# Patient Record
Sex: Male | Born: 1940 | Race: White | Hispanic: Refuse to answer | Marital: Married | Smoking: Former smoker
Health system: Southern US, Community
[De-identification: ages and names within clinical notes are randomized; demographics above are authoritative.]

## PROBLEM LIST (undated history)

## (undated) DIAGNOSIS — R9439 Abnormal result of other cardiovascular function study: Secondary | ICD-10-CM

## (undated) DIAGNOSIS — R931 Abnormal findings on diagnostic imaging of heart and coronary circulation: Secondary | ICD-10-CM

## (undated) DIAGNOSIS — D696 Thrombocytopenia, unspecified: Secondary | ICD-10-CM

## (undated) DIAGNOSIS — E785 Hyperlipidemia, unspecified: Secondary | ICD-10-CM

## (undated) DIAGNOSIS — E669 Obesity, unspecified: Secondary | ICD-10-CM

## (undated) DIAGNOSIS — Z872 Personal history of diseases of the skin and subcutaneous tissue: Secondary | ICD-10-CM

## (undated) DIAGNOSIS — I509 Heart failure, unspecified: Secondary | ICD-10-CM

## (undated) DIAGNOSIS — I252 Old myocardial infarction: Secondary | ICD-10-CM

## (undated) DIAGNOSIS — T7840XA Allergy, unspecified, initial encounter: Secondary | ICD-10-CM

## (undated) DIAGNOSIS — K219 Gastro-esophageal reflux disease without esophagitis: Secondary | ICD-10-CM

## (undated) DIAGNOSIS — Z95810 Presence of automatic (implantable) cardiac defibrillator: Secondary | ICD-10-CM

## (undated) DIAGNOSIS — J309 Allergic rhinitis, unspecified: Secondary | ICD-10-CM

## (undated) DIAGNOSIS — I5042 Chronic combined systolic (congestive) and diastolic (congestive) heart failure: Secondary | ICD-10-CM

## (undated) DIAGNOSIS — E1159 Type 2 diabetes mellitus with other circulatory complications: Secondary | ICD-10-CM

## (undated) DIAGNOSIS — I2510 Atherosclerotic heart disease of native coronary artery without angina pectoris: Secondary | ICD-10-CM

## (undated) DIAGNOSIS — M76899 Other specified enthesopathies of unspecified lower limb, excluding foot: Secondary | ICD-10-CM

## (undated) DIAGNOSIS — N189 Chronic kidney disease, unspecified: Secondary | ICD-10-CM

## (undated) DIAGNOSIS — I340 Nonrheumatic mitral (valve) insufficiency: Secondary | ICD-10-CM

## (undated) DIAGNOSIS — E119 Type 2 diabetes mellitus without complications: Secondary | ICD-10-CM

## (undated) DIAGNOSIS — Z950 Presence of cardiac pacemaker: Secondary | ICD-10-CM

## (undated) DIAGNOSIS — I10 Essential (primary) hypertension: Secondary | ICD-10-CM

## (undated) HISTORY — DX: Other specified enthesopathies of unspecified lower limb, excluding foot: M76.899

## (undated) HISTORY — DX: Atherosclerotic heart disease of native coronary artery without angina pectoris: I25.10

## (undated) HISTORY — DX: Abnormal findings on diagnostic imaging of heart and coronary circulation: R93.1

## (undated) HISTORY — DX: Obesity, unspecified: E66.9

## (undated) HISTORY — DX: Presence of cardiac pacemaker: Z95.0

## (undated) HISTORY — DX: Type 2 diabetes mellitus without complications: E11.9

## (undated) HISTORY — DX: Thrombocytopenia, unspecified: D69.6

## (undated) HISTORY — DX: Hyperlipidemia, unspecified: E78.5

## (undated) HISTORY — DX: Old myocardial infarction: I25.2

## (undated) HISTORY — DX: Chronic combined systolic (congestive) and diastolic (congestive) heart failure: I50.42

## (undated) HISTORY — DX: Allergic rhinitis, unspecified: J30.9

## (undated) HISTORY — DX: Chronic kidney disease, unspecified: N18.9

## (undated) HISTORY — DX: Personal history of diseases of the skin and subcutaneous tissue: Z87.2

## (undated) HISTORY — DX: Allergy, unspecified, initial encounter: T78.40XA

## (undated) HISTORY — DX: Abnormal result of other cardiovascular function study: R94.39

## (undated) HISTORY — DX: Heart failure, unspecified: I50.9

## (undated) HISTORY — DX: Presence of automatic (implantable) cardiac defibrillator: Z95.810

## (undated) HISTORY — DX: Nonrheumatic mitral (valve) insufficiency: I34.0

## (undated) HISTORY — DX: Type 2 diabetes mellitus with other circulatory complications: E11.59

## (undated) HISTORY — DX: Gastro-esophageal reflux disease without esophagitis: K21.9

## (undated) HISTORY — DX: Essential (primary) hypertension: I10

---
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
## 2002-08-11 ENCOUNTER — Encounter: Payer: Self-pay | Admitting: Interventional Cardiology

## 2002-08-11 ENCOUNTER — Inpatient Hospital Stay (HOSPITAL_COMMUNITY): Admission: EM | Admit: 2002-08-11 | Discharge: 2002-08-14 | Payer: Self-pay | Admitting: *Deleted

## 2002-08-12 ENCOUNTER — Encounter: Payer: Self-pay | Admitting: Interventional Cardiology

## 2002-09-04 ENCOUNTER — Encounter (HOSPITAL_COMMUNITY): Admission: RE | Admit: 2002-09-04 | Discharge: 2002-12-03 | Payer: Self-pay | Admitting: Interventional Cardiology

## 2002-12-04 ENCOUNTER — Encounter (HOSPITAL_COMMUNITY): Admission: RE | Admit: 2002-12-04 | Discharge: 2002-12-11 | Payer: Self-pay | Admitting: Interventional Cardiology

## 2002-12-12 ENCOUNTER — Encounter (HOSPITAL_COMMUNITY): Admission: RE | Admit: 2002-12-12 | Discharge: 2003-03-12 | Payer: Self-pay | Admitting: Interventional Cardiology

## 2003-04-07 ENCOUNTER — Encounter (HOSPITAL_COMMUNITY): Admission: RE | Admit: 2003-04-07 | Discharge: 2003-07-06 | Payer: Self-pay | Admitting: Interventional Cardiology

## 2003-07-08 ENCOUNTER — Encounter (HOSPITAL_COMMUNITY): Admission: RE | Admit: 2003-07-08 | Discharge: 2003-10-06 | Payer: Self-pay | Admitting: Interventional Cardiology

## 2003-10-08 ENCOUNTER — Encounter (HOSPITAL_COMMUNITY): Admission: RE | Admit: 2003-10-08 | Discharge: 2004-01-06 | Payer: Self-pay | Admitting: Interventional Cardiology

## 2003-11-22 ENCOUNTER — Encounter (INDEPENDENT_AMBULATORY_CARE_PROVIDER_SITE_OTHER): Payer: Self-pay | Admitting: *Deleted

## 2003-11-22 ENCOUNTER — Ambulatory Visit (HOSPITAL_COMMUNITY): Admission: RE | Admit: 2003-11-22 | Discharge: 2003-11-22 | Payer: Self-pay | Admitting: Gastroenterology

## 2004-01-08 ENCOUNTER — Encounter (HOSPITAL_COMMUNITY): Admission: RE | Admit: 2004-01-08 | Discharge: 2004-04-07 | Payer: Self-pay | Admitting: Interventional Cardiology

## 2004-04-08 ENCOUNTER — Encounter (HOSPITAL_COMMUNITY): Admission: RE | Admit: 2004-04-08 | Discharge: 2004-07-07 | Payer: Self-pay | Admitting: Interventional Cardiology

## 2004-07-08 ENCOUNTER — Encounter (HOSPITAL_COMMUNITY): Admission: RE | Admit: 2004-07-08 | Discharge: 2004-10-06 | Payer: Self-pay | Admitting: Interventional Cardiology

## 2004-10-07 ENCOUNTER — Encounter (HOSPITAL_COMMUNITY): Admission: RE | Admit: 2004-10-07 | Discharge: 2005-01-05 | Payer: Self-pay | Admitting: Cardiology

## 2005-01-07 ENCOUNTER — Encounter (HOSPITAL_COMMUNITY): Admission: RE | Admit: 2005-01-07 | Discharge: 2005-04-07 | Payer: Self-pay | Admitting: Cardiology

## 2005-04-09 ENCOUNTER — Encounter (HOSPITAL_COMMUNITY): Admission: RE | Admit: 2005-04-09 | Discharge: 2005-07-08 | Payer: Self-pay | Admitting: Cardiology

## 2005-08-07 ENCOUNTER — Encounter (HOSPITAL_COMMUNITY): Admission: RE | Admit: 2005-08-07 | Discharge: 2005-11-05 | Payer: Self-pay | Admitting: Cardiology

## 2005-11-06 ENCOUNTER — Encounter (HOSPITAL_COMMUNITY): Admission: RE | Admit: 2005-11-06 | Discharge: 2006-02-03 | Payer: Self-pay | Admitting: Cardiology

## 2006-02-05 ENCOUNTER — Encounter (HOSPITAL_COMMUNITY): Admission: RE | Admit: 2006-02-05 | Discharge: 2006-05-06 | Payer: Self-pay | Admitting: Cardiology

## 2006-05-07 ENCOUNTER — Encounter (HOSPITAL_COMMUNITY): Admission: RE | Admit: 2006-05-07 | Discharge: 2006-08-05 | Payer: Self-pay | Admitting: Cardiology

## 2006-08-07 ENCOUNTER — Encounter (HOSPITAL_COMMUNITY): Admission: RE | Admit: 2006-08-07 | Discharge: 2006-11-05 | Payer: Self-pay | Admitting: Cardiology

## 2006-11-06 ENCOUNTER — Encounter (HOSPITAL_COMMUNITY): Admission: RE | Admit: 2006-11-06 | Discharge: 2007-02-04 | Payer: Self-pay | Admitting: Cardiology

## 2007-02-05 ENCOUNTER — Encounter (HOSPITAL_COMMUNITY): Admission: RE | Admit: 2007-02-05 | Discharge: 2007-05-06 | Payer: Self-pay | Admitting: Cardiology

## 2007-05-09 ENCOUNTER — Encounter (HOSPITAL_COMMUNITY): Admission: RE | Admit: 2007-05-09 | Discharge: 2007-08-07 | Payer: Self-pay | Admitting: Cardiology

## 2007-08-08 ENCOUNTER — Encounter (HOSPITAL_COMMUNITY): Admission: RE | Admit: 2007-08-08 | Discharge: 2007-11-06 | Payer: Self-pay | Admitting: Cardiology

## 2007-11-07 ENCOUNTER — Encounter (HOSPITAL_COMMUNITY): Admission: RE | Admit: 2007-11-07 | Discharge: 2007-12-06 | Payer: Self-pay | Admitting: Cardiology

## 2007-12-08 ENCOUNTER — Encounter (HOSPITAL_COMMUNITY): Admission: RE | Admit: 2007-12-08 | Discharge: 2008-03-01 | Payer: Self-pay | Admitting: Cardiology

## 2008-03-07 ENCOUNTER — Encounter (HOSPITAL_COMMUNITY): Admission: RE | Admit: 2008-03-07 | Discharge: 2008-06-05 | Payer: Self-pay | Admitting: Cardiology

## 2008-06-06 ENCOUNTER — Encounter (HOSPITAL_COMMUNITY): Admission: RE | Admit: 2008-06-06 | Discharge: 2008-09-04 | Payer: Self-pay | Admitting: Cardiology

## 2008-09-06 ENCOUNTER — Encounter (HOSPITAL_COMMUNITY): Admission: RE | Admit: 2008-09-06 | Discharge: 2008-11-15 | Payer: Self-pay | Admitting: Cardiology

## 2011-04-24 NOTE — Op Note (Signed)
NAME:  [name], [name] NO.:  [id number]   MEDICAL RECORD NO.:  [id number]                   PATIENT TYPE:  AMB   LOCATION:  ENDO                                 FACILITY:  [facility]   PHYSICIAN:  Graylin Shiver, M.D.                DATE OF BIRTH:  30-Jul-1941   DATE OF PROCEDURE:  11/22/2003  DATE OF DISCHARGE:                                 OPERATIVE REPORT   PROCEDURE:  Colonoscopy.   INDICATION FOR PROCEDURE:  Screening.   Informed consent was obtained after explanation of the risks of bleeding,  infection, and perforation.   PREMEDICATIONS:  1. Fentanyl 50 mcg IV.  2. Versed 6 mg IV.   DESCRIPTION OF PROCEDURE:  With the patient in the left lateral decubitus  position, a rectal exam was performed, and no masses were felt.  The Olympus  colonoscope was inserted into the rectum and advanced around the colon to  the cecum.  Cecal landmarks were identified.  In the cecum, there was a  small 3 mm sessile polyp, removed with cold forceps.  The ascending colon  looked normal.  The transverse colon looked normal.  The descending colon  and sigmoid revealed diverticulosis.  The rectum was normal.  He tolerated  the procedure well without complications.   IMPRESSION:  1. Small cecal polyp.  Diagnosis code 211.3.  2. Diverticulosis of the left colon.  Diagnosis code 562.10.                                               Graylin Shiver, M.D.    Germain Osgood  D:  11/22/2003  T:  11/22/2003  Job:  [id number]   cc:   Donia Guiles, M.D.  [address] [place]  [place] [address]  Fax: [phone]

## 2011-04-24 NOTE — Cardiovascular Report (Signed)
NAME:  ELISE, GLADDEN NO.:  [id number]   MEDICAL RECORD NO.:  [id number]                   PATIENT TYPE:  INP   LOCATION:  1826                                 FACILITY:  [facility]   PHYSICIAN:  Lesleigh Noe, M.D.            DATE OF BIRTH:  [birth year]   DATE OF PROCEDURE:  08/11/2002  DATE OF DISCHARGE:                              CARDIAC CATHETERIZATION   INDICATIONS FOR PROCEDURE:  Acute inferior myocardial infarction with third-  degree heart block and cardiogenic shock. The patient has a prior history of  large anterior myocardial infarction in 1993 and angioplasty 1993.   PROCEDURE PERFORMED:  1. Left heart catheterization.  2. Selective coronary angiogram.  3. Left ventriculography.  4. Temporary pacemaker insertion.  5. AngioJet thrombectomy.  6. Multiple overlapping right coronary artery stents in the mid and proximal     segment (x3).   DESCRIPTION OF PROCEDURE:  After informed consent, the patient was brought  quickly under emergency circumstances from the emergency room where he was  in third-degree heart block and cardiogenic shock requiring IV fluids, IV  dopamine and analgesia for pain.  He has a prior history of anterior  myocardial infarction suffered while scuba diving in the [place] in 1993,  and subsequently had LAD angioplasty upon return to [place]. He has done  relatively well since that time until today when preparing to play golf, he  suddenly became ill. In the ER, he was noted to be in extremist with low  blood pressure pallor and profuse sweating.   In the catheterization lab the left and right ileofemoral regions were  sterilely prepped and draped.  A 7 French sheath starting in the right  femoral artery and a 6 French sheath in the right femoral vein, both using  the modified Seldinger technique.  A balloon tip pacing wire was placed in  the RV apex and an adequate threshold was achieved and backup pacing  was  started at 80 beats per minute in DVI mode and an mA of 3.  The threshold  was less than 1 mA.   We then performed coronary angiography using a 6 French A2 multipurpose  catheter. This demonstrated total occlusion of the RCA in the mid segment  and patent LAD and circumflex with region of segmental 50-60% LAD stenosis.  Left ventriculography was performed by hand injection demonstrating  anteroapical akinesis and inferior akinesis.  EF was estimated to be 25-30%.   Percutaneous coronary intervention was then performed using a 7 [place] side-  hole JR4 guide catheter, a BMW 190 cm long wire. This established  reperfusion. He had been given 5000 units of heparin in the emergency room  and a double bolus followed by an infusion of Integrilin was started in the  catheterization lab prior to wire manipulation. ACT was documented to be 254  seconds initially and then subsequent ACT of 328  seconds.   Because of what appeared to be a significant thrombosis burden, AngioJet  thrombectomy was performed.  Two runs were performed after extending the BMW  wire. We then performed intervention of the right coronary placing a distal  mid segment 3.0, 24 mm long stent and a overlapping more proximal 3.5 mm 20  mm long stent in the right coronary with reestablishment of wide patency. I  was unhappy with the appearance of the right coronary just distal to the  stented segment. It appeared somewhat hazy and disrupted in the LAO views.  After a period of consideration, I decided to place a very short stent  distally overlapping with the distal most portion of the stented segment. We  had difficulty getting this 8 mm long, 3.0 mm stent to tract. We had to  place a luge wire to provide buddy support. The stent then advanced easily  and was deployed without difficulty. The 3.0 mm stents were all brought to  around 2.3 mm with thin diameter in size using up to 17 atmospheres of  pressure. The more proximal  3.5 stent was brought to nominal diameter. The  patient tolerated the procedure. After reestablishment of flow, heart block  resolved. At the completion of the case, the pacemaker was removed. Dopamine  was discontinued because the patient had an adequate blood pressure.  A  Foley catheter was placed because of urinary retention, then the case was  terminated.   RESULTS:  I:  HEMODYNAMIC DATA:  a.  Aortic pressure 124/78.  b.  Left ventricular pressure 121/27.   II:  LEFT VENTRICULOGRAPHY:  The left ventricle is dilated. Anteroapical  akinesis is noted. Inferior akinesis is noted. EF is estimated to be 20-30%.  No MR.   III:  CORONARY ANGIOGRAPHY:  a.  Left main coronary artery:  Widely patent.  b.  Left anterior descending coronary:  The proximal/mid segment containing  50-60% regions of narrowing but no high-grade obstruction.  The first  diagonal contains a 70% ostial narrowing. The LAD wraps around the left  ventricular apex.  c.  Circumflex artery:  Two branches arise from the circumflex and no  significant obstruction is noted.  d.  RCA:  The RCA is 100% occluded in the mid vessel.   IV:  RIGHT CORONARY PERCUTANEOUS INTERVENTION:  RCA:  100% prior to  procedure in the mid segment reduced to 0% with brisk TIMI grade 3 flow  following 3.5 mm, 20 mm, long proximal stent overlapped with a 3.0 mm, 24 mm  long mid stent overlapped with an 8 mm, 3.0 stent at the distal margin of  the stented segment. Post reperfusion chest pain, resolved.   CONCLUSIONS:  1. Acute inferior myocardial infarction with shock and third-degree heart     block.  2. Successful percutaneous intervention on the right coronary with     reestablishment of flow to a totally occluded vessel to 0% stenosis. TIMI     grade 3 flow was noted after stenting as outlined above.  3. Prior anterolateral myocardial infarction in 1993.  4. Prior left anterior descending angioplasty following anterior myocardial     infarction in 1993.  5. Left ventricular dysfunction as described above.   PLAN:  Continue Ace and beta blocker. We will switch to Coreg off of  Lopressor. Continue statin therapy. Watch for evidence of heart failure.  Will more than likely need diuretic therapy to keep his lungs dry as he  recovers over the next several days.  Integrilin x24 hours, aspirin and  Plavix. Close clinical followup. Monitor for malignant arrhythmias.                                                                                          Lesleigh Noe, M.D.    HWS/MEDQ  D:  08/11/2002  T:  08/14/2002  Job:  [id number]   cc:   Desma Maxim, M.D.

## 2011-04-24 NOTE — Discharge Summary (Signed)
NAME:  [name], [name] NO.:  [id number]   MEDICAL RECORD NO.:  [id number]                   PATIENT TYPE:  INP   LOCATION:  4727                                 FACILITY:  [facility]   PHYSICIAN:  Lyn Records, M.D.                DATE OF BIRTH:  [birth year]   DATE OF ADMISSION:  08/11/2002  DATE OF DISCHARGE:  08/14/2002                                 DISCHARGE SUMMARY   PROCEDURE:  (08/11/02) Stent x 3 RCA.   DISCHARGE DIAGNOSES:  1. Coronary atherosclerotic heart disease.     a. Acute inferior myocardial infarction with shock and third degree        arteriovenous block.     b. Temporary transvenous pacemaker.     c. Successful percutaneous intervention with AngioJet and stent x 3 right        coronary artery.     d. Ischemic alveolar function, ejection fraction approximately 30% with        anterior apical akinesis/inferior akinesia.     e. Residual 30-60% proximal and mid left anterior descending; 70%        diagonal number one.  Circumflex was normal.  Left main okay.     f. Prior anterior myocardial infarction in 1993; prior left anterior        descending percutaneous transluminal coronary angioplasty in 1993.     g. Peak CK of 3200.  2. History of dyslipidemia on Zetia and Zocor.  3. Diabetes mellitus.  Admission Glucophage held secondary to exposing     kidneys to cath dye.  Restarted 08/13/02.  4. History of hypertension.  Actually profoundly hypotensive on admission in     the setting of acute inferior myocardial infarction and cardiogenic     shock.  Blood pressure stabilized and at the time of discharge his blood     pressure was 120s over 70s on Norvasc and Altace as prior to admission.   PLAN:  The patient was discharged home in stable condition.   DISCHARGE MEDICATIONS:  1. (New) Furosemide (Lasix) 20 mg p.o. q.d.  2. (New) Plavix 75 mg 1 p.o. q.d. for one month.  3. (New) Coreg 12.5 mg 1/2 tab p.o. b.i.d.  4. (New) Nitroglycerin  tablets 0.4 mg 1 sublingual p.r.n. chest pain up to 3     tabs in 15 minutes.  5. Altace 10 mg p.o. q.d.  6. Norvasc 5 mg p.o. q.d.  7. Zocor 20 mg p.o. q.h.s.  8. Zetia 10 mg p.o. q.d.  9. Enteric coated aspirin 225 mg 1 q.d.  10.      Glucophage 750 mg p.o. b.i.d.   ACTIVITY:  As outlined by cardiac rehab nurse.  Anticipate enrollment in  cardiac rehab as an outpatient.'   DIET:  Low fat, low cholesterol, no added salt/low salt.   WOUND CARE:  May shower.   SPECIAL INSTRUCTIONS:  Stop Metoprolol.  Call our office if he develops  large amounts of swelling or bruising in the groin area.   FOLLOWUP:  Dr. Verdis Prime on August 28, 2002 at 4:15 p.m.; office number  [phone].   HISTORY OF PRESENT ILLNESS:  The patient is a 71 year old diabetic male with  a history of prior anterior MI with subsequent PTCA of the LAD back in 1993.  His last cardiac evaluation was an office treadmill test in 1999 which was  okay.   The patient was on his way to play golf with his son, developed acute  anterior chest discomfort.  His son drove him to the emergency room where he  had EKG changes consistent with acute inferior myocardial infarction.  He  was diaphoretic.  His blood pressure was 80 systolic.  Heart rate 30s to  40s.  Complete heart block with junctional escape.  He was given aspirin  with subsequent emesis.  He was given Fentanyl 150 mg, Zofran without much  relief with nausea.  He was given heparin bolus 5000 units with subsequent  drip and dopamine drip at 5 mcg per kg per minute.  He was also given 1 amp  of atropine and wide open IV fluids.  His blood pressure improved to 102/70s  and heart 50s in junctional rhythm, respiratory rate 16.   The patient was taken urgently to cardiac catheterization lab where he  underwent left heart catheterization and coronary angiography as well as LV  gram.  A temporary transvenous pacemaker was placed for heart rate and  hemodynamic support.   The results are as follows:  1. LV gram:  Anterior apical akinesis/inferior akinesia, ejection fraction     approximately 30%.  2. Coronary angiography:     A. Left main.     B. LAD:  30-60% proximal/mid.  Diagonal #1 70%.     C. Circumflex:  Normal.     D. RCA:  100% mid.  3. PCI of RCA with subsequent stent x 3.  Reduction of stenosis from 100% to     0% with TIMI-III flow established.   The patient was given IV Integrelin bolus with subsequent drip.  His Toprol  was discontinued and he was initiated on a low-dose Coreg.  He was continued  on Altace and Norvasc.  The patient had a good post procedure hospital  course, ambulating up and about cardiac rehab, and without subsequent  complaints.  He was discharged home in stable condition on medications as  detailed above.   LABORATORY TESTS:  Admission WBC 10.2, hemoglobin 15.2, hematocrit 42.8,  platelets 229.  Protime 12.3, INR 0.9, PTT 23.  Sodium 140, potassium 3.9,  chloride 111, glucose 165, BUN 28, creatinine 1.4 (BUN 19 and creatinine 1.3  on 08/12/02).  LFTs within normal range.  First CK 116, MB fraction 2.2,  troponin-I 0.04.  Second CK 3786, MB fraction 286.9.  Third CK of 3766, MB  fraction 215.2.  Fourth CK of 1939, MB fraction 92.7.  EKG revealed complete  heart block and changes consistent with acute inferior myocardial infarction  with reciprocal anterior ST-T wave changes.  Chest x-ray on 08/12/02  revealed interval development of patchy opacities at the left base  reflecting atelectatic changes or air space disease.     Salomon Fick, N.P.                       Lyn Records, M.D.    MES/MEDQ  D:  08/14/2002  T:  08/15/2002  Job:  [id number]   cc:   Desma Maxim, M.D.

## 2011-04-24 NOTE — H&P (Signed)
NAME:  [name], [name] NO.:  [id number]   MEDICAL RECORD NO.:  [id number]                   PATIENT TYPE:  EMS   LOCATION:  MAJO                                 FACILITY:  [facility]   PHYSICIAN:  Lyn Records, M.D.                DATE OF BIRTH:  10/30/1941   DATE OF ADMISSION:  08/11/2002  DATE OF DISCHARGE:                                HISTORY & PHYSICAL   PRIMARY CARE PHYSICIAN:  Desma Maxim, M.D.   IMPRESSION:  1. Acute inferior myocardial infarction complicated by complete heart block     and severe hypotension.  2. Diabetes mellitus.  3. Elevated cholesterol.   PLAN:  Urgently to catheterization lab, anticipate percutaneous intervention  if indicated and able.  The risks, potential complications, benefits, and  alternatives to the procedure were discussed in detail with the patient's  son.  Anticipate placement of temporary transvenous pacemaker, as the  patient is in complete heart block with slow junctional rhythm.   HISTORY OF PRESENT ILLNESS:  The patient is a 70 year old diabetic male with  history of prior anterior MI (occurred in the [place], question aborted with  TPA) in approximately 1992.  Subsequent PTCA of the proximal LAD in 1993.  His last evaluation in our clinic involved a treadmill test in the office,  which was okay.   On the way to play golf today with his son, the patient developed the acute  onset of malaise, anterior chest discomfort.  The son drove him to the [facility] emergency room, where EKG changes were consistent with acute inferior  myocardial infarction.  The patient was progressively diaphoretic, pale, and  hypotensive with blood pressure running 80s to 90s systolic.  He was given  four baby aspirin, had emesis including aspirin pills.  He was given Zofran  IV without much relief of nausea.  Given fentanyl 15 mcg IV for chest  discomfort.  Unable to give nitrates secondary to hypotension.  Given IV  heparin bolus 5000 and drip and dopamine drip 5 mcg/kg per minute.  Also  given 1 amp of atropine.  IV fluids were wide open.  Blood pressure improved  to 102/75, heart rate to 50s, though remained in complete heart block.  Respiratory rate was 16.  Room air saturations were okay, and his chest x-  ray was negative for congestion.  He was then taken urgently to the  catheterization lab.   PAST MEDICAL HISTORY:  1. Coronary atherosclerotic heart disease.     a. 8/93:  PTCA of the proximal LAD.  His EF was approximately 35-40%.        Had severe anterior hypokinesis/akinesis.  Residual ostial 50-60%        diagonal 2.     b. 12/99:  Office treadmill test, which was okay.  2. Diabetes mellitus.  3. Mild diverticulosis.  4. Elevated cholesterol, last  cholesterol profile from 06/30/02 revealed     cholesterol 211, triglycerides 221 HDL 40, LDL 140.  Zetia was added at     that time.  5. Hypertension.   PAST SURGICAL HISTORY:  None.   ALLERGIES:  No known drug allergies.  Okay with seafood, shellfish, and  iodinated products.   MEDICATIONS:  1. Aspirin one per day.  2. Zocor 20 mg p.o. q.d.  3. Metoprolol 50 mg p.o. q.d.  4. Altace 10 mg p.o. q.d.  5. Norvasc 5 mg p.o. q.d.  6. Glucophage XR 750 mg p.o. b.i.d.  7. Zetia 10 mg p.o. q.d.   SOCIAL HISTORY:  Tobacco/ETOH negative.  The patient works as an Interior and spatial designer.  He is married for greater than 30 years, has two sons in  [place], who are alive and well.   FAMILY HISTORY:  Father died at age 17 of lung cancer.  Mother is age 47,  alive and well.  He has one sister, who is okay.   REVIEW OF SYMPTOMS:  As in HPI/past medical history.  Otherwise unable to  obtain.  His son states that the patient has no problems with indigestion,  nor has he complained of chest pain nor discomfort nor DOE to his son's  knowledge.   PHYSICAL EXAMINATION:  VITAL SIGNS:  Blood pressure 80s-90s systolic on  initial evaluation with heart  rate 30s-40s, complete heart block.  O2  saturations 100% on room air.  GENERAL:  This is a diaphoretic, pale and ill-appearing 70 year old male in  continued chest discomfort.  HEENT/NECK:  There is bilateral carotid upstroke without bruit.  No  significant JVD.  CHEST:  Lung sounds clear.  CARDIAC:  Regular rate and rhythm without murmur, rub, nor gallop.  Normal  S1, S2.  EXTREMITIES:  Negative pedal edema.  Good dorsalis pedis pulses.  NEUROLOGIC:  Lethargic.  Alert and oriented x3.  Grossly nonfocal neurologic  exam.  GENITAL/RECTAL:  Deferred.   LABORATORY DATA:  EKG revealed complete heart block with changes consistent  with acute inferior myocardial infarction.  Junctional rhythm, approximately  40 beats per minute.  Subsequent EKG the same with increase in heart rate to  42 beats per minute.  Chest x-ray revealed cardiac enlargement, no  congestive heart failure.   ISTAT-8 revealed sodium 140, potassium 3.9, chloride 111, CO2 22, BUN 28,  creatinine 1.4, glucose 165.  His hemoglobin was 15, hematocrit of 46.  Old  labs from 06/30/02 from primary care M.D. office revealed BUN of 23,  creatinine 1.3.  Hemoglobin A1C at 7.3.  Cholesterol is 211, triglycerides  221, HDL 40, LDL 140.  At that time Zetia was added to his regimen.      Salomon Fick, N.P.                       Lyn Records, M.D.    MES/MEDQ  D:  08/11/2002  T:  08/11/2002  Job:  [id number]   cc:   Desma Maxim, M.D.

## 2011-08-26 DIAGNOSIS — R931 Abnormal findings on diagnostic imaging of heart and coronary circulation: Secondary | ICD-10-CM

## 2011-08-26 DIAGNOSIS — R9439 Abnormal result of other cardiovascular function study: Secondary | ICD-10-CM

## 2011-08-26 DIAGNOSIS — I5042 Chronic combined systolic (congestive) and diastolic (congestive) heart failure: Secondary | ICD-10-CM

## 2011-08-26 HISTORY — DX: Abnormal findings on diagnostic imaging of heart and coronary circulation: R93.1

## 2011-08-26 HISTORY — DX: Abnormal result of other cardiovascular function study: R94.39

## 2011-08-26 HISTORY — DX: Chronic combined systolic (congestive) and diastolic (congestive) heart failure: I50.42

## 2011-12-09 DIAGNOSIS — Z950 Presence of cardiac pacemaker: Secondary | ICD-10-CM

## 2011-12-09 HISTORY — DX: Presence of cardiac pacemaker: Z95.0

## 2013-09-04 DIAGNOSIS — I152 Hypertension secondary to endocrine disorders: Secondary | ICD-10-CM | POA: Insufficient documentation

## 2013-09-04 DIAGNOSIS — E1159 Type 2 diabetes mellitus with other circulatory complications: Secondary | ICD-10-CM | POA: Insufficient documentation

## 2013-09-04 DIAGNOSIS — E119 Type 2 diabetes mellitus without complications: Secondary | ICD-10-CM

## 2013-09-04 HISTORY — DX: Hypertension secondary to endocrine disorders: I15.2

## 2013-09-04 HISTORY — DX: Type 2 diabetes mellitus with other circulatory complications: E11.59

## 2013-09-04 HISTORY — DX: Type 2 diabetes mellitus without complications: E11.9

## 2015-01-04 DIAGNOSIS — I129 Hypertensive chronic kidney disease with stage 1 through stage 4 chronic kidney disease, or unspecified chronic kidney disease: Secondary | ICD-10-CM

## 2015-01-04 DIAGNOSIS — N189 Chronic kidney disease, unspecified: Secondary | ICD-10-CM

## 2015-01-04 DIAGNOSIS — M76899 Other specified enthesopathies of unspecified lower limb, excluding foot: Secondary | ICD-10-CM

## 2015-01-04 DIAGNOSIS — E1122 Type 2 diabetes mellitus with diabetic chronic kidney disease: Secondary | ICD-10-CM | POA: Insufficient documentation

## 2015-01-04 DIAGNOSIS — N183 Chronic kidney disease, stage 3 unspecified: Secondary | ICD-10-CM | POA: Insufficient documentation

## 2015-01-04 DIAGNOSIS — N182 Chronic kidney disease, stage 2 (mild): Secondary | ICD-10-CM

## 2015-01-04 HISTORY — DX: Chronic kidney disease, unspecified: N18.9

## 2015-01-04 HISTORY — DX: Other specified enthesopathies of unspecified lower limb, excluding foot: M76.899

## 2015-09-12 DIAGNOSIS — Z872 Personal history of diseases of the skin and subcutaneous tissue: Secondary | ICD-10-CM

## 2015-09-12 HISTORY — DX: Personal history of diseases of the skin and subcutaneous tissue: Z87.2

## 2016-01-22 DIAGNOSIS — I255 Ischemic cardiomyopathy: Secondary | ICD-10-CM | POA: Diagnosis not present

## 2016-01-24 DIAGNOSIS — E119 Type 2 diabetes mellitus without complications: Secondary | ICD-10-CM | POA: Diagnosis not present

## 2016-01-29 DIAGNOSIS — I255 Ischemic cardiomyopathy: Secondary | ICD-10-CM | POA: Diagnosis not present

## 2016-01-29 DIAGNOSIS — E1122 Type 2 diabetes mellitus with diabetic chronic kidney disease: Secondary | ICD-10-CM | POA: Diagnosis not present

## 2016-01-29 DIAGNOSIS — N183 Chronic kidney disease, stage 3 (moderate): Secondary | ICD-10-CM | POA: Diagnosis not present

## 2016-01-29 DIAGNOSIS — Z794 Long term (current) use of insulin: Secondary | ICD-10-CM | POA: Diagnosis not present

## 2016-01-30 DIAGNOSIS — Z95810 Presence of automatic (implantable) cardiac defibrillator: Secondary | ICD-10-CM | POA: Diagnosis not present

## 2016-01-30 DIAGNOSIS — I255 Ischemic cardiomyopathy: Secondary | ICD-10-CM | POA: Diagnosis not present

## 2016-01-30 DIAGNOSIS — I509 Heart failure, unspecified: Secondary | ICD-10-CM | POA: Diagnosis not present

## 2016-01-30 DIAGNOSIS — Z4502 Encounter for adjustment and management of automatic implantable cardiac defibrillator: Secondary | ICD-10-CM | POA: Diagnosis not present

## 2016-03-27 DIAGNOSIS — Z872 Personal history of diseases of the skin and subcutaneous tissue: Secondary | ICD-10-CM | POA: Diagnosis not present

## 2016-03-27 DIAGNOSIS — I255 Ischemic cardiomyopathy: Secondary | ICD-10-CM | POA: Diagnosis not present

## 2016-03-27 DIAGNOSIS — L570 Actinic keratosis: Secondary | ICD-10-CM | POA: Diagnosis not present

## 2016-04-30 DIAGNOSIS — N189 Chronic kidney disease, unspecified: Secondary | ICD-10-CM | POA: Diagnosis not present

## 2016-04-30 DIAGNOSIS — I255 Ischemic cardiomyopathy: Secondary | ICD-10-CM | POA: Diagnosis not present

## 2016-05-05 DIAGNOSIS — M533 Sacrococcygeal disorders, not elsewhere classified: Secondary | ICD-10-CM | POA: Diagnosis not present

## 2016-05-05 DIAGNOSIS — E1122 Type 2 diabetes mellitus with diabetic chronic kidney disease: Secondary | ICD-10-CM | POA: Diagnosis not present

## 2016-05-05 DIAGNOSIS — Z794 Long term (current) use of insulin: Secondary | ICD-10-CM | POA: Diagnosis not present

## 2016-05-05 DIAGNOSIS — I255 Ischemic cardiomyopathy: Secondary | ICD-10-CM | POA: Diagnosis not present

## 2016-05-05 DIAGNOSIS — N183 Chronic kidney disease, stage 3 (moderate): Secondary | ICD-10-CM | POA: Diagnosis not present

## 2016-06-29 DIAGNOSIS — K219 Gastro-esophageal reflux disease without esophagitis: Secondary | ICD-10-CM | POA: Diagnosis not present

## 2016-06-29 DIAGNOSIS — I255 Ischemic cardiomyopathy: Secondary | ICD-10-CM | POA: Diagnosis not present

## 2016-06-29 DIAGNOSIS — B880 Other acariasis: Secondary | ICD-10-CM | POA: Diagnosis not present

## 2016-07-20 DIAGNOSIS — T63483A Toxic effect of venom of other arthropod, assault, initial encounter: Secondary | ICD-10-CM | POA: Diagnosis not present

## 2016-07-20 DIAGNOSIS — I255 Ischemic cardiomyopathy: Secondary | ICD-10-CM | POA: Diagnosis not present

## 2016-07-20 DIAGNOSIS — L853 Xerosis cutis: Secondary | ICD-10-CM | POA: Diagnosis not present

## 2016-07-20 DIAGNOSIS — L739 Follicular disorder, unspecified: Secondary | ICD-10-CM | POA: Diagnosis not present

## 2016-07-21 DIAGNOSIS — I255 Ischemic cardiomyopathy: Secondary | ICD-10-CM | POA: Diagnosis not present

## 2016-08-06 DIAGNOSIS — E785 Hyperlipidemia, unspecified: Secondary | ICD-10-CM | POA: Diagnosis not present

## 2016-08-06 DIAGNOSIS — E113593 Type 2 diabetes mellitus with proliferative diabetic retinopathy without macular edema, bilateral: Secondary | ICD-10-CM | POA: Diagnosis not present

## 2016-08-06 DIAGNOSIS — N183 Chronic kidney disease, stage 3 (moderate): Secondary | ICD-10-CM | POA: Diagnosis not present

## 2016-08-06 DIAGNOSIS — E1122 Type 2 diabetes mellitus with diabetic chronic kidney disease: Secondary | ICD-10-CM | POA: Diagnosis not present

## 2016-08-06 DIAGNOSIS — Z794 Long term (current) use of insulin: Secondary | ICD-10-CM | POA: Diagnosis not present

## 2016-08-06 DIAGNOSIS — I255 Ischemic cardiomyopathy: Secondary | ICD-10-CM | POA: Diagnosis not present

## 2016-08-12 DIAGNOSIS — Z872 Personal history of diseases of the skin and subcutaneous tissue: Secondary | ICD-10-CM | POA: Diagnosis not present

## 2016-08-12 DIAGNOSIS — I255 Ischemic cardiomyopathy: Secondary | ICD-10-CM | POA: Diagnosis not present

## 2016-08-12 DIAGNOSIS — B079 Viral wart, unspecified: Secondary | ICD-10-CM | POA: Diagnosis not present

## 2016-08-12 DIAGNOSIS — L821 Other seborrheic keratosis: Secondary | ICD-10-CM | POA: Diagnosis not present

## 2016-08-12 DIAGNOSIS — L739 Follicular disorder, unspecified: Secondary | ICD-10-CM | POA: Diagnosis not present

## 2016-08-13 DIAGNOSIS — B079 Viral wart, unspecified: Secondary | ICD-10-CM | POA: Diagnosis not present

## 2016-08-26 DIAGNOSIS — L02212 Cutaneous abscess of back [any part, except buttock]: Secondary | ICD-10-CM | POA: Diagnosis not present

## 2016-08-26 DIAGNOSIS — I255 Ischemic cardiomyopathy: Secondary | ICD-10-CM | POA: Diagnosis not present

## 2016-10-01 DIAGNOSIS — L820 Inflamed seborrheic keratosis: Secondary | ICD-10-CM | POA: Diagnosis not present

## 2016-10-01 DIAGNOSIS — Z872 Personal history of diseases of the skin and subcutaneous tissue: Secondary | ICD-10-CM | POA: Diagnosis not present

## 2016-10-01 DIAGNOSIS — L570 Actinic keratosis: Secondary | ICD-10-CM | POA: Diagnosis not present

## 2016-10-01 DIAGNOSIS — I255 Ischemic cardiomyopathy: Secondary | ICD-10-CM | POA: Diagnosis not present

## 2016-10-07 DIAGNOSIS — Z95810 Presence of automatic (implantable) cardiac defibrillator: Secondary | ICD-10-CM | POA: Diagnosis not present

## 2016-10-07 DIAGNOSIS — I340 Nonrheumatic mitral (valve) insufficiency: Secondary | ICD-10-CM | POA: Diagnosis not present

## 2016-10-07 DIAGNOSIS — I2510 Atherosclerotic heart disease of native coronary artery without angina pectoris: Secondary | ICD-10-CM | POA: Diagnosis not present

## 2016-10-07 DIAGNOSIS — Z950 Presence of cardiac pacemaker: Secondary | ICD-10-CM | POA: Diagnosis not present

## 2016-10-07 DIAGNOSIS — Z4502 Encounter for adjustment and management of automatic implantable cardiac defibrillator: Secondary | ICD-10-CM | POA: Diagnosis not present

## 2016-10-07 DIAGNOSIS — I442 Atrioventricular block, complete: Secondary | ICD-10-CM | POA: Diagnosis not present

## 2016-10-07 DIAGNOSIS — I472 Ventricular tachycardia: Secondary | ICD-10-CM | POA: Diagnosis not present

## 2016-10-07 DIAGNOSIS — I213 ST elevation (STEMI) myocardial infarction of unspecified site: Secondary | ICD-10-CM | POA: Diagnosis not present

## 2016-10-07 DIAGNOSIS — I509 Heart failure, unspecified: Secondary | ICD-10-CM | POA: Diagnosis not present

## 2016-10-07 DIAGNOSIS — I2583 Coronary atherosclerosis due to lipid rich plaque: Secondary | ICD-10-CM | POA: Diagnosis not present

## 2016-10-07 DIAGNOSIS — I255 Ischemic cardiomyopathy: Secondary | ICD-10-CM | POA: Diagnosis not present

## 2016-11-06 DIAGNOSIS — Z23 Encounter for immunization: Secondary | ICD-10-CM | POA: Diagnosis not present

## 2016-11-06 DIAGNOSIS — Z0000 Encounter for general adult medical examination without abnormal findings: Secondary | ICD-10-CM | POA: Diagnosis not present

## 2016-11-06 DIAGNOSIS — Z794 Long term (current) use of insulin: Secondary | ICD-10-CM | POA: Diagnosis not present

## 2016-11-06 DIAGNOSIS — Z1211 Encounter for screening for malignant neoplasm of colon: Secondary | ICD-10-CM | POA: Diagnosis not present

## 2016-11-06 DIAGNOSIS — E1165 Type 2 diabetes mellitus with hyperglycemia: Secondary | ICD-10-CM | POA: Diagnosis not present

## 2016-11-24 DIAGNOSIS — I255 Ischemic cardiomyopathy: Secondary | ICD-10-CM | POA: Diagnosis not present

## 2016-11-24 DIAGNOSIS — J189 Pneumonia, unspecified organism: Secondary | ICD-10-CM | POA: Diagnosis not present

## 2016-12-10 DIAGNOSIS — R195 Other fecal abnormalities: Secondary | ICD-10-CM | POA: Diagnosis not present

## 2016-12-10 DIAGNOSIS — I509 Heart failure, unspecified: Secondary | ICD-10-CM | POA: Diagnosis not present

## 2016-12-22 DIAGNOSIS — Z1212 Encounter for screening for malignant neoplasm of rectum: Secondary | ICD-10-CM | POA: Diagnosis not present

## 2016-12-22 DIAGNOSIS — Z1211 Encounter for screening for malignant neoplasm of colon: Secondary | ICD-10-CM | POA: Diagnosis not present

## 2017-02-04 DIAGNOSIS — E1165 Type 2 diabetes mellitus with hyperglycemia: Secondary | ICD-10-CM | POA: Diagnosis not present

## 2017-02-04 DIAGNOSIS — Z794 Long term (current) use of insulin: Secondary | ICD-10-CM | POA: Diagnosis not present

## 2017-02-10 DIAGNOSIS — I255 Ischemic cardiomyopathy: Secondary | ICD-10-CM | POA: Diagnosis not present

## 2017-02-10 DIAGNOSIS — I2510 Atherosclerotic heart disease of native coronary artery without angina pectoris: Secondary | ICD-10-CM | POA: Diagnosis not present

## 2017-02-10 DIAGNOSIS — I509 Heart failure, unspecified: Secondary | ICD-10-CM | POA: Diagnosis not present

## 2017-02-10 DIAGNOSIS — I2583 Coronary atherosclerosis due to lipid rich plaque: Secondary | ICD-10-CM | POA: Diagnosis not present

## 2017-02-10 DIAGNOSIS — Z95810 Presence of automatic (implantable) cardiac defibrillator: Secondary | ICD-10-CM | POA: Diagnosis not present

## 2017-02-10 DIAGNOSIS — I340 Nonrheumatic mitral (valve) insufficiency: Secondary | ICD-10-CM | POA: Diagnosis not present

## 2017-02-11 DIAGNOSIS — Z4502 Encounter for adjustment and management of automatic implantable cardiac defibrillator: Secondary | ICD-10-CM | POA: Diagnosis not present

## 2017-03-30 DIAGNOSIS — L820 Inflamed seborrheic keratosis: Secondary | ICD-10-CM | POA: Diagnosis not present

## 2017-03-30 DIAGNOSIS — D239 Other benign neoplasm of skin, unspecified: Secondary | ICD-10-CM | POA: Diagnosis not present

## 2017-03-30 DIAGNOSIS — Z872 Personal history of diseases of the skin and subcutaneous tissue: Secondary | ICD-10-CM | POA: Diagnosis not present

## 2017-03-30 DIAGNOSIS — L570 Actinic keratosis: Secondary | ICD-10-CM | POA: Diagnosis not present

## 2017-03-30 DIAGNOSIS — I255 Ischemic cardiomyopathy: Secondary | ICD-10-CM | POA: Diagnosis not present

## 2017-05-05 DIAGNOSIS — N183 Chronic kidney disease, stage 3 (moderate): Secondary | ICD-10-CM | POA: Diagnosis not present

## 2017-05-05 DIAGNOSIS — I255 Ischemic cardiomyopathy: Secondary | ICD-10-CM | POA: Diagnosis not present

## 2017-05-05 DIAGNOSIS — Z794 Long term (current) use of insulin: Secondary | ICD-10-CM | POA: Diagnosis not present

## 2017-05-05 DIAGNOSIS — E1122 Type 2 diabetes mellitus with diabetic chronic kidney disease: Secondary | ICD-10-CM | POA: Diagnosis not present

## 2017-05-12 DIAGNOSIS — Z794 Long term (current) use of insulin: Secondary | ICD-10-CM | POA: Diagnosis not present

## 2017-05-12 DIAGNOSIS — E669 Obesity, unspecified: Secondary | ICD-10-CM | POA: Diagnosis not present

## 2017-05-12 DIAGNOSIS — I255 Ischemic cardiomyopathy: Secondary | ICD-10-CM | POA: Diagnosis not present

## 2017-05-12 DIAGNOSIS — E113593 Type 2 diabetes mellitus with proliferative diabetic retinopathy without macular edema, bilateral: Secondary | ICD-10-CM | POA: Diagnosis not present

## 2017-05-12 DIAGNOSIS — I10 Essential (primary) hypertension: Secondary | ICD-10-CM | POA: Diagnosis not present

## 2017-05-12 HISTORY — DX: Obesity, unspecified: E66.9

## 2017-05-28 DIAGNOSIS — I213 ST elevation (STEMI) myocardial infarction of unspecified site: Secondary | ICD-10-CM | POA: Diagnosis not present

## 2017-05-28 DIAGNOSIS — Z95810 Presence of automatic (implantable) cardiac defibrillator: Secondary | ICD-10-CM | POA: Diagnosis not present

## 2017-05-28 DIAGNOSIS — I255 Ischemic cardiomyopathy: Secondary | ICD-10-CM | POA: Diagnosis not present

## 2017-05-28 DIAGNOSIS — Z4502 Encounter for adjustment and management of automatic implantable cardiac defibrillator: Secondary | ICD-10-CM | POA: Diagnosis not present

## 2017-05-28 DIAGNOSIS — I10 Essential (primary) hypertension: Secondary | ICD-10-CM | POA: Diagnosis not present

## 2017-05-28 DIAGNOSIS — I5022 Chronic systolic (congestive) heart failure: Secondary | ICD-10-CM | POA: Diagnosis not present

## 2017-05-28 DIAGNOSIS — I5042 Chronic combined systolic (congestive) and diastolic (congestive) heart failure: Secondary | ICD-10-CM | POA: Diagnosis not present

## 2017-07-05 DIAGNOSIS — I255 Ischemic cardiomyopathy: Secondary | ICD-10-CM | POA: Diagnosis not present

## 2017-07-14 DIAGNOSIS — Z794 Long term (current) use of insulin: Secondary | ICD-10-CM | POA: Diagnosis not present

## 2017-07-14 DIAGNOSIS — E119 Type 2 diabetes mellitus without complications: Secondary | ICD-10-CM | POA: Diagnosis not present

## 2017-07-14 DIAGNOSIS — H2513 Age-related nuclear cataract, bilateral: Secondary | ICD-10-CM | POA: Diagnosis not present

## 2017-07-14 LAB — HM DIABETES EYE EXAM

## 2017-08-13 DIAGNOSIS — E113593 Type 2 diabetes mellitus with proliferative diabetic retinopathy without macular edema, bilateral: Secondary | ICD-10-CM | POA: Diagnosis not present

## 2017-08-13 DIAGNOSIS — Z794 Long term (current) use of insulin: Secondary | ICD-10-CM | POA: Diagnosis not present

## 2017-08-13 DIAGNOSIS — E785 Hyperlipidemia, unspecified: Secondary | ICD-10-CM | POA: Diagnosis not present

## 2017-08-17 DIAGNOSIS — Z95810 Presence of automatic (implantable) cardiac defibrillator: Secondary | ICD-10-CM | POA: Diagnosis not present

## 2017-08-17 DIAGNOSIS — I5022 Chronic systolic (congestive) heart failure: Secondary | ICD-10-CM | POA: Diagnosis not present

## 2017-08-17 DIAGNOSIS — I2510 Atherosclerotic heart disease of native coronary artery without angina pectoris: Secondary | ICD-10-CM | POA: Diagnosis not present

## 2017-08-17 DIAGNOSIS — I2583 Coronary atherosclerosis due to lipid rich plaque: Secondary | ICD-10-CM | POA: Diagnosis not present

## 2017-08-17 DIAGNOSIS — Z794 Long term (current) use of insulin: Secondary | ICD-10-CM | POA: Diagnosis not present

## 2017-08-17 DIAGNOSIS — I10 Essential (primary) hypertension: Secondary | ICD-10-CM | POA: Diagnosis not present

## 2017-08-17 DIAGNOSIS — E119 Type 2 diabetes mellitus without complications: Secondary | ICD-10-CM | POA: Diagnosis not present

## 2017-08-17 DIAGNOSIS — I255 Ischemic cardiomyopathy: Secondary | ICD-10-CM | POA: Diagnosis not present

## 2017-08-25 DIAGNOSIS — Z794 Long term (current) use of insulin: Secondary | ICD-10-CM | POA: Diagnosis not present

## 2017-08-25 DIAGNOSIS — E785 Hyperlipidemia, unspecified: Secondary | ICD-10-CM | POA: Diagnosis not present

## 2017-08-25 DIAGNOSIS — E113593 Type 2 diabetes mellitus with proliferative diabetic retinopathy without macular edema, bilateral: Secondary | ICD-10-CM | POA: Diagnosis not present

## 2017-08-25 DIAGNOSIS — I255 Ischemic cardiomyopathy: Secondary | ICD-10-CM | POA: Diagnosis not present

## 2017-08-25 DIAGNOSIS — I10 Essential (primary) hypertension: Secondary | ICD-10-CM | POA: Diagnosis not present

## 2017-09-13 DIAGNOSIS — Z4502 Encounter for adjustment and management of automatic implantable cardiac defibrillator: Secondary | ICD-10-CM | POA: Diagnosis not present

## 2017-09-13 DIAGNOSIS — Z95810 Presence of automatic (implantable) cardiac defibrillator: Secondary | ICD-10-CM | POA: Diagnosis not present

## 2017-09-28 ENCOUNTER — Ambulatory Visit (INDEPENDENT_AMBULATORY_CARE_PROVIDER_SITE_OTHER): Payer: Medicare Other

## 2017-09-28 ENCOUNTER — Ambulatory Visit (INDEPENDENT_AMBULATORY_CARE_PROVIDER_SITE_OTHER): Payer: Medicare Other | Admitting: Family Medicine

## 2017-09-28 ENCOUNTER — Encounter: Payer: Self-pay | Admitting: Family Medicine

## 2017-09-28 VITALS — BP 110/64 | HR 58 | Temp 97.8°F | Ht 72.0 in | Wt 237.0 lb

## 2017-09-28 DIAGNOSIS — E669 Obesity, unspecified: Secondary | ICD-10-CM | POA: Diagnosis not present

## 2017-09-28 DIAGNOSIS — E1169 Type 2 diabetes mellitus with other specified complication: Secondary | ICD-10-CM

## 2017-09-28 DIAGNOSIS — J309 Allergic rhinitis, unspecified: Secondary | ICD-10-CM

## 2017-09-28 DIAGNOSIS — E1122 Type 2 diabetes mellitus with diabetic chronic kidney disease: Secondary | ICD-10-CM | POA: Diagnosis not present

## 2017-09-28 DIAGNOSIS — Z95810 Presence of automatic (implantable) cardiac defibrillator: Secondary | ICD-10-CM

## 2017-09-28 DIAGNOSIS — K219 Gastro-esophageal reflux disease without esophagitis: Secondary | ICD-10-CM

## 2017-09-28 DIAGNOSIS — Z23 Encounter for immunization: Secondary | ICD-10-CM

## 2017-09-28 DIAGNOSIS — M5418 Radiculopathy, sacral and sacrococcygeal region: Secondary | ICD-10-CM | POA: Diagnosis not present

## 2017-09-28 DIAGNOSIS — E1159 Type 2 diabetes mellitus with other circulatory complications: Secondary | ICD-10-CM | POA: Diagnosis not present

## 2017-09-28 DIAGNOSIS — I509 Heart failure, unspecified: Secondary | ICD-10-CM

## 2017-09-28 DIAGNOSIS — G8929 Other chronic pain: Secondary | ICD-10-CM

## 2017-09-28 DIAGNOSIS — E785 Hyperlipidemia, unspecified: Secondary | ICD-10-CM

## 2017-09-28 DIAGNOSIS — M533 Sacrococcygeal disorders, not elsewhere classified: Secondary | ICD-10-CM | POA: Diagnosis not present

## 2017-09-28 DIAGNOSIS — I252 Old myocardial infarction: Secondary | ICD-10-CM

## 2017-09-28 DIAGNOSIS — M546 Pain in thoracic spine: Secondary | ICD-10-CM

## 2017-09-28 DIAGNOSIS — Z794 Long term (current) use of insulin: Secondary | ICD-10-CM

## 2017-09-28 DIAGNOSIS — I152 Hypertension secondary to endocrine disorders: Secondary | ICD-10-CM

## 2017-09-28 DIAGNOSIS — N182 Chronic kidney disease, stage 2 (mild): Secondary | ICD-10-CM

## 2017-09-28 DIAGNOSIS — M5134 Other intervertebral disc degeneration, thoracic region: Secondary | ICD-10-CM | POA: Diagnosis not present

## 2017-09-28 DIAGNOSIS — I10 Essential (primary) hypertension: Secondary | ICD-10-CM

## 2017-09-28 DIAGNOSIS — I340 Nonrheumatic mitral (valve) insufficiency: Secondary | ICD-10-CM

## 2017-09-28 DIAGNOSIS — I2510 Atherosclerotic heart disease of native coronary artery without angina pectoris: Secondary | ICD-10-CM

## 2017-09-28 HISTORY — DX: Atherosclerotic heart disease of native coronary artery without angina pectoris: I25.10

## 2017-09-28 HISTORY — DX: Allergic rhinitis, unspecified: J30.9

## 2017-09-28 HISTORY — DX: Hyperlipidemia, unspecified: E78.5

## 2017-09-28 HISTORY — DX: Old myocardial infarction: I25.2

## 2017-09-28 HISTORY — DX: Nonrheumatic mitral (valve) insufficiency: I34.0

## 2017-09-28 HISTORY — DX: Gastro-esophageal reflux disease without esophagitis: K21.9

## 2017-09-28 HISTORY — DX: Presence of automatic (implantable) cardiac defibrillator: Z95.810

## 2017-09-28 HISTORY — DX: Heart failure, unspecified: I50.9

## 2017-09-28 NOTE — Progress Notes (Signed)
[name] is a 76 y.o. male is here to [facility].   Patient Care Team: [name] Deutscher, DO as PCP - General (Family Medicine)   History of Present Illness:   Shaune Pascal CMA acting as scribe for Dr. Juleen China.  HPI: Patient comes in today to establish care. Patient has several health issues to discuss.   Diabetes: Patient has been on insulin for ten to fifteen years. He is on 68 units of Lantus daily and Novolog 30 units daily. He has tried Metformin in the past, but with the kidney disease so not able to help. Discussed starting a new medication to control diabetes. Will have the diabetes educator come in to discuss medications.   Back Pain: Patient stated that she is having some back pain that is between shoulder blades. Patient also states that if he sits in a hard chair he will start having pain in the tail bone.   Health Maintenance Due  Topic Date Due  . HEMOGLOBIN A1C  August 10, 1941  . FOOT EXAM  08/03/1951  . OPHTHALMOLOGY EXAM  08/03/1951  . URINE MICROALBUMIN  08/03/1951   Depression screen PHQ 2/9 09/28/2017  Decreased Interest 0  Down, Depressed, Hopeless 0  PHQ - 2 Score 0   PMHx, SurgHx, SocialHx, Medications, and Allergies were reviewed in the Visit Navigator and updated as appropriate.   Past Medical History:  Diagnosis Date  . Abnormal echocardiogram 08/26/2011  . Abnormal nuclear stress test 08/26/2011  . Allergic rhinitis 09/28/2017  . 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 CAD (coronary artery disease), native coronary artery 09/28/2017  . Cardiac defibrillator in place 09/28/2017  . 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 Laurance Flatten  . Congestive heart failure ([place]) 09/28/2017  . DM (diabetes mellitus), type 2 ([place]) 09/04/2013  . Enthesopathy of hip region 01/04/2015  . GERD (gastroesophageal reflux disease) 09/28/2017  . History of actinic keratoses 09/12/2015  .  Hyperlipidemia 09/28/2017  . Hypertension associated with diabetes ([place]) 09/04/2013  . Mitral valve incompetence 09/28/2017  . Obesity (BMI 30-39.9) 05/12/2017  . 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 Andria Frames, Dr. Purcell Nails  . Systolic and diastolic CHF, chronic ([place]) 08/26/2011   History reviewed. No pertinent surgical history. History reviewed. No pertinent family history. Social History  Substance Use Topics  . Smoking status: Not on file  . Smokeless tobacco: Not on file  . Alcohol use Not on file    Current Medications and Allergies:   Current Outpatient Prescriptions:  .  aspirin 325 MG tablet, Take 325 mg by mouth., Disp: , Rfl:  .  carvedilol (COREG) 25 MG tablet, TAKE 1 TABLET TWICE A DAY, Disp: , Rfl:  .  digoxin (LANOXIN) 0.125 MG tablet, Take by mouth., Disp: , Rfl:  .  ENTRESTO 49-51 MG, , Disp: , Rfl:  .  ezetimibe-simvastatin (VYTORIN) 10-40 MG tablet, TAKE 1 TABLET DAILY, Disp: , Rfl:  .  fluticasone (FLONASE) 50 MCG/ACT nasal spray, USE 2 SPRAYS NASALLY TWO TIMES DAILY, Disp: , Rfl:  .  furosemide (LASIX) 40 MG tablet, TAKE 1 TABLET TWICE A DAY, Disp: , Rfl:  .  insulin aspart (NOVOLOG) 100 UNIT/ML FlexPen, INJECT 30 UNITS UNDER THE SKIN THREE TIMES A DAY WITH MEALS, Disp: , Rfl:  .  Insulin Glargine (LANTUS SOLOSTAR) 100 UNIT/ML Solostar Pen, INJECT 78 UNITS UNDER THE SKIN DAILY, Disp: , Rfl:  .  isosorbide mononitrate (  IMDUR) 30 MG 24 hr tablet, Take 30 mg by mouth., Disp: , Rfl:  .  nitroGLYCERIN (NITROSTAT) 0.4 MG SL tablet, Place 0.4 mg under the tongue., Disp: , Rfl:  .  omeprazole (PRILOSEC) 40 MG capsule, TAKE 1 CAPSULE DAILY, Disp: , Rfl:  .  potassium chloride SA (KLOR-CON M20) 20 MEQ tablet, TAKE 1 TABLET DAILY WITH LASIX, Disp: , Rfl:  .  Zoster Vaccine Live, PF, (ZOSTAVAX) 30865 UNT/0.65ML injection, TO BE ADMINISTERED BY PHARMACIST FOR IMMUNIZATION, Disp: , Rfl:   No Known Allergies   Review of Systems:   Pertinent items are  noted in the HPI. Otherwise, ROS is negative.  Vitals:   Vitals:   09/28/17 1306  BP: 110/64  Pulse: (!) 58  Temp: 97.8 F (36.6 C)  TempSrc: Oral  SpO2: 97%  Weight: 237 lb (107.5 kg)  Height: 6' (1.829 m)     Body mass index is 32.14 kg/m.   Physical Exam:   Physical Exam  Constitutional: He is oriented to person, place, and time. He appears well-developed and well-nourished. No distress.  HENT:  Head: Normocephalic and atraumatic.  Right Ear: External ear normal.  Left Ear: External ear normal.  Nose: Nose normal.  Mouth/Throat: Oropharynx is clear and moist.  Eyes: Pupils are equal, round, and reactive to light. Conjunctivae and EOM are normal.  Neck: Normal range of motion. Neck supple.  Cardiovascular: Normal rate, regular rhythm, normal heart sounds and intact distal pulses.   Pulmonary/Chest: Effort normal and breath sounds normal.  Abdominal: Soft. Bowel sounds are normal.  Musculoskeletal: Normal range of motion.  Neurological: He is alert and oriented to person, place, and time.  Skin: Skin is warm and dry.  Psychiatric: He has a normal mood and affect. His behavior is normal. Judgment and thought content normal.  Nursing note and vitals reviewed.  EXAM: THORACIC SPINE - 3 VIEWS  COMPARISON:  None.  FINDINGS: Bilateral subclavian pacer and AICD leads. Mild anterior spur formation at multiple levels. Lower cervical spine degenerative changes. No fractures or subluxations.  IMPRESSION: Multilevel degenerative changes.  No acute abnormality.  EXAM: SACRUM AND COCCYX - 2+ VIEW  COMPARISON:  None.  FINDINGS: Normal appearing sacrum and coccyx. Atheromatous arterial calcifications.  IMPRESSION: Normal appearing sacrum and coccyx.  Assessment and Plan:   Diagnoses and all orders for this visit:  Chronic right-sided thoracic back pain Radicular pain of sacrum Comments: Arthralgia. Offered medication and PT. Patient declined  today. Orders: -     DG Sacrum/Coccyx; Future -     DG Thoracic Spine W/Swimmers; Future  Type 2 diabetes mellitus with chronic kidney disease, with long-term current use of insulin, unspecified CKD stage (HCC) Comments:   Stage 2 chronic kidney disease Comments:   Obesity (BMI 30-39.9) Comments: The patient is asked to make an attempt to improve diet and exercise patterns to aid in medical management of this problem.   Mixed hyperlipidemia Comments:    Hypertension associated with diabetes ([place]) Comments:  BP Readings from Last 3 Encounters:  09/28/17 110/64   Encounter for immunization -     Flu vaccine HIGH DOSE PF   . Reviewed expectations re: course of current medical issues. . Discussed self-management of symptoms. . Outlined signs and symptoms indicating need for more acute intervention. . Patient verbalized understanding and all questions were answered. Marland Kitchen Health Maintenance issues including appropriate healthy diet, exercise, and smoking avoidance were discussed with patient. . See orders for this visit as documented in the electronic  medical record. . Patient received an After Visit Summary.  CMA served as Education administrator during this visit. History, Physical, and Plan performed by medical provider. The above documentation has been reviewed and is accurate and complete. [name] Deutscher, D.O.  [name] Deutscher, DO [place] 10/02/2017  Future Appointments Date Time Provider [place]  12/29/2017 10:00 AM [name] Deutscher, DO [facility] None

## 2017-09-29 ENCOUNTER — Encounter: Payer: Self-pay | Admitting: Surgical

## 2017-09-29 ENCOUNTER — Encounter: Payer: Self-pay | Admitting: Family Medicine

## 2017-09-29 ENCOUNTER — Telehealth: Payer: Self-pay | Admitting: Surgical

## 2017-09-29 NOTE — Telephone Encounter (Signed)
Changed medications on patients list.

## 2017-09-30 ENCOUNTER — Other Ambulatory Visit: Payer: Self-pay | Admitting: Surgical

## 2017-09-30 DIAGNOSIS — M546 Pain in thoracic spine: Principal | ICD-10-CM

## 2017-09-30 DIAGNOSIS — G8929 Other chronic pain: Secondary | ICD-10-CM

## 2017-10-04 DIAGNOSIS — L853 Xerosis cutis: Secondary | ICD-10-CM | POA: Diagnosis not present

## 2017-10-04 DIAGNOSIS — D239 Other benign neoplasm of skin, unspecified: Secondary | ICD-10-CM | POA: Diagnosis not present

## 2017-10-04 DIAGNOSIS — I10 Essential (primary) hypertension: Secondary | ICD-10-CM | POA: Diagnosis not present

## 2017-10-04 DIAGNOSIS — L570 Actinic keratosis: Secondary | ICD-10-CM | POA: Diagnosis not present

## 2017-10-04 DIAGNOSIS — L821 Other seborrheic keratosis: Secondary | ICD-10-CM | POA: Diagnosis not present

## 2017-10-04 DIAGNOSIS — I255 Ischemic cardiomyopathy: Secondary | ICD-10-CM | POA: Diagnosis not present

## 2017-10-04 DIAGNOSIS — Z872 Personal history of diseases of the skin and subcutaneous tissue: Secondary | ICD-10-CM | POA: Diagnosis not present

## 2017-10-11 ENCOUNTER — Encounter: Payer: Self-pay | Admitting: Family Medicine

## 2017-10-11 ENCOUNTER — Ambulatory Visit (INDEPENDENT_AMBULATORY_CARE_PROVIDER_SITE_OTHER): Payer: Medicare Other | Admitting: Family Medicine

## 2017-10-11 VITALS — BP 112/68 | HR 67 | Temp 97.8°F | Ht 72.0 in | Wt 233.0 lb

## 2017-10-11 DIAGNOSIS — J069 Acute upper respiratory infection, unspecified: Secondary | ICD-10-CM

## 2017-10-11 MED ORDER — AMOXICILLIN 875 MG PO TABS: 875.0000 mg | ORAL_TABLET | Freq: Two times a day (BID) | ORAL | 0 refills | Status: DC

## 2017-10-11 MED ORDER — GUAIFENESIN-CODEINE 100-10 MG/5ML PO SYRP: 10.0000 mL | ORAL_SOLUTION | Freq: Every evening | ORAL | 0 refills | Status: DC | PRN

## 2017-10-11 NOTE — Progress Notes (Signed)
[name] is a 76 y.o. male here for an acute visit.  History of Present Illness:   Cough  This is a new problem. The current episode started in the past 7 days. The problem has been gradually worsening. The cough is non-productive. Associated symptoms include nasal congestion, postnasal drip, rhinorrhea and a sore throat. Pertinent negatives include no chest pain, chills or fever. The symptoms are aggravated by lying down. He has tried rest for the symptoms.   PMHx, SurgHx, SocialHx, Medications, and Allergies were reviewed in the Visit Navigator and updated as appropriate.  Current Medications:   Current Outpatient Medications:  .  aspirin 325 MG tablet, Take 325 mg by mouth., Disp: , Rfl:  .  carvedilol (COREG) 25 MG tablet, TAKE 1 TABLET TWICE A DAY, Disp: , Rfl:  .  digoxin (LANOXIN) 0.125 MG tablet, Take by mouth., Disp: , Rfl:  .  ENTRESTO 49-51 MG, , Disp: , Rfl:  .  ezetimibe-simvastatin (VYTORIN) 10-40 MG tablet, TAKE 1 TABLET DAILY, Disp: , Rfl:  .  fluticasone (FLONASE) 50 MCG/ACT nasal spray, USE 2 SPRAYS NASALLY TWO TIMES DAILY, Disp: , Rfl:  .  furosemide (LASIX) 40 MG tablet, TAKE 1 TABLET TWICE A DAY, Disp: , Rfl:  .  insulin aspart (NOVOLOG) 100 UNIT/ML FlexPen, INJECT 30 UNITS UNDER THE SKIN THREE TIMES A DAY WITH MEALS, Disp: , Rfl:  .  Insulin Glargine (LANTUS SOLOSTAR) 100 UNIT/ML Solostar Pen, INJECT 68 UNITS UNDER THE SKIN DAILY, Disp: , Rfl:  .  isosorbide mononitrate (IMDUR) 30 MG 24 hr tablet, Take 30 mg by mouth., Disp: , Rfl:  .  nitroGLYCERIN (NITROSTAT) 0.4 MG SL tablet, Place 0.4 mg under the tongue., Disp: , Rfl:  .  omeprazole (PRILOSEC) 40 MG capsule, TAKE 1 CAPSULE DAILY, Disp: , Rfl:  .  potassium chloride SA (KLOR-CON M20) 20 MEQ tablet, TAKE 1 TABLET DAILY WITH LASIX, Disp: , Rfl:  .  ranitidine (ZANTAC) 150 MG capsule, Take by mouth., Disp: , Rfl:  .  Zoster Vaccine Live, PF, (ZOSTAVAX) 50932 UNT/0.65ML injection, TO BE ADMINISTERED BY  PHARMACIST FOR IMMUNIZATION, Disp: , Rfl:  .  amoxicillin (AMOXIL) 875 MG tablet, Take 1 tablet (875 mg total) 2 (two) times daily by mouth., Disp: 20 tablet, Rfl: 0 .  guaiFENesin-codeine (CHERATUSSIN AC) 100-10 MG/5ML syrup, Take 10 mLs at bedtime as needed by mouth for cough or congestion., Disp: 120 mL, Rfl: 0   No Known Allergies   Review of Systems:   Pertinent items are noted in the HPI. Otherwise, ROS is negative.  Vitals:   Vitals:   10/11/17 1141  BP: 112/68  Pulse: 67  Temp: 97.8 F (36.6 C)  TempSrc: Oral  SpO2: 96%  Weight: 233 lb (105.7 kg)  Height: 6' (1.829 m)     Body mass index is 31.6 kg/m.   Physical Exam:   Physical Exam  Constitutional: He is oriented to person, place, and time. He appears well-developed and well-nourished. No distress.  HENT:  Head: Normocephalic and atraumatic.  Right Ear: External ear normal.  Left Ear: External ear normal.  Nose: Rhinorrhea present.  Mouth/Throat: Posterior oropharyngeal erythema present.  Eyes: Conjunctivae and EOM are normal. Pupils are equal, round, and reactive to light.  Neck: Normal range of motion. Neck supple.  Cardiovascular: Normal rate, regular rhythm, normal heart sounds and intact distal pulses.  Pulmonary/Chest: Effort normal and breath sounds normal.  Abdominal: Soft. Bowel sounds are normal.  Musculoskeletal: Normal range of  motion.  Neurological: He is alert and oriented to person, place, and time.  Skin: Skin is warm and dry.  Psychiatric: He has a normal mood and affect. His behavior is normal. Judgment and thought content normal.  Nursing note and vitals reviewed.   Assessment and Plan:   [name] was seen today for cough.  Diagnoses and all orders for this visit:  Viral upper respiratory tract infection Comments: Symtomatic care and red flags reviewed. Safety net Abx given. Orders: -     guaiFENesin-codeine (CHERATUSSIN AC) 100-10 MG/5ML syrup; Take 10 mLs at bedtime as needed by  mouth for cough or congestion. -     amoxicillin (AMOXIL) 875 MG tablet; Take 1 tablet (875 mg total) 2 (two) times daily by mouth.   . Reviewed expectations re: course of current medical issues. . Discussed self-management of symptoms. . Outlined signs and symptoms indicating need for more acute intervention. . Patient verbalized understanding and all questions were answered. Marland Kitchen Health Maintenance issues including appropriate healthy diet, exercise, and smoking avoidance were discussed with patient. . See orders for this visit as documented in the electronic medical record. . Patient received an After Visit Summary.  [name] Deutscher, DO [place] 10/11/2017  Future Appointments  Date Time Provider [place]  12/29/2017 10:00 AM [name] Deutscher, DO [facility] None

## 2017-10-18 DIAGNOSIS — Z4502 Encounter for adjustment and management of automatic implantable cardiac defibrillator: Secondary | ICD-10-CM | POA: Diagnosis not present

## 2017-10-19 ENCOUNTER — Other Ambulatory Visit: Payer: Self-pay

## 2017-10-19 MED ORDER — FUROSEMIDE 40 MG PO TABS: 40.0000 mg | ORAL_TABLET | Freq: Two times a day (BID) | ORAL | 1 refills | Status: DC

## 2017-10-19 MED ORDER — FLUTICASONE PROPIONATE 50 MCG/ACT NA SUSP: NASAL | 1 refills | Status: DC

## 2017-10-19 MED ORDER — FREESTYLE LANCETS MISC: 1 refills | Status: DC

## 2017-10-19 MED ORDER — POTASSIUM CHLORIDE CRYS ER 20 MEQ PO TBCR: EXTENDED_RELEASE_TABLET | ORAL | 1 refills | Status: DC

## 2017-10-19 MED ORDER — AZELASTINE HCL 0.15 % NA SOLN: 1.0000 | Freq: Every day | NASAL | 1 refills | Status: DC

## 2017-10-19 MED ORDER — INSULIN ASPART 100 UNIT/ML FLEXPEN: PEN_INJECTOR | SUBCUTANEOUS | 1 refills | Status: DC

## 2017-10-19 MED ORDER — DIGOXIN 125 MCG PO TABS: 0.1250 mg | ORAL_TABLET | Freq: Every day | ORAL | 1 refills | Status: DC

## 2017-10-19 MED ORDER — EZETIMIBE-SIMVASTATIN 10-40 MG PO TABS: 1.0000 | ORAL_TABLET | Freq: Every day | ORAL | 1 refills | Status: DC

## 2017-10-19 MED ORDER — OMEPRAZOLE 40 MG PO CPDR: 40.0000 mg | DELAYED_RELEASE_CAPSULE | Freq: Every day | ORAL | 1 refills | Status: DC

## 2017-10-20 ENCOUNTER — Other Ambulatory Visit: Payer: Self-pay

## 2017-10-20 ENCOUNTER — Ambulatory Visit (INDEPENDENT_AMBULATORY_CARE_PROVIDER_SITE_OTHER): Payer: Medicare Other | Admitting: Physical Therapy

## 2017-10-20 ENCOUNTER — Encounter: Payer: Self-pay | Admitting: Physical Therapy

## 2017-10-20 DIAGNOSIS — R293 Abnormal posture: Secondary | ICD-10-CM | POA: Diagnosis not present

## 2017-10-20 DIAGNOSIS — M546 Pain in thoracic spine: Secondary | ICD-10-CM | POA: Diagnosis not present

## 2017-10-20 NOTE — Therapy (Signed)
[facility] [address] [place], [place], [address] Phone: [phone]   Fax:  [phone]  Physical Therapy Evaluation  Patient Details  Name: [name] MRN: [id number] Date of Birth: 1941-03-01 Referring Provider: Dr. [name] Deutscher   Encounter Date: 10/20/2017  PT End of Session - 10/20/17 1151    Visit Number  1    Number of Visits  6    Date for PT Re-Evaluation  12/01/17    Authorization Type  Medicare/Tricare    PT Start Time  1050    PT Stop Time  1132    PT Time Calculation (min)  42 min    Activity Tolerance  Patient limited by pain    Behavior During Therapy  [facility] for tasks assessed/performed       Past Medical History:  Diagnosis Date  . Abnormal echocardiogram 08/26/2011  . Abnormal nuclear stress test 08/26/2011  . Allergic rhinitis 09/28/2017  . 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 CAD (coronary artery disease), native coronary artery 09/28/2017  . Cardiac defibrillator in place 09/28/2017  . 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 Laurance Flatten  . Congestive heart failure ([place]) 09/28/2017  . DM (diabetes mellitus), type 2 ([place]) 09/04/2013  . Enthesopathy of hip region 01/04/2015  . GERD (gastroesophageal reflux disease) 09/28/2017  . History of actinic keratoses 09/12/2015  . Hyperlipidemia 09/28/2017  . Hypertension associated with diabetes ([place]) 09/04/2013  . Mitral valve incompetence 09/28/2017  . Obesity (BMI 30-39.9) 05/12/2017  . 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 Andria Frames, Dr. Purcell Nails  . Systolic and diastolic CHF, chronic ([place]) 08/26/2011    History reviewed. No pertinent surgical history.  There were no vitals filed for this visit.   Subjective Assessment - 10/20/17 1050    Subjective  Pt is a 76 y/o male who presents to OPPT for mid back pain between scapulae.  Pt reports pain x 4-5 years and is  described as intermittent.      Pertinent History  see snapshot    Limitations  House hold activities;Standing    How long can you stand comfortably?  < 5 min    Diagnostic tests  x rays: arthritis thoracic spine    Patient Stated Goals  drive without pain    Currently in Pain?  Yes    Pain Score  3     Pain Location  Back    Pain Orientation  Mid;Upper    Pain Descriptors / Indicators  Dull    Pain Type  Chronic pain    Pain Onset  More than a month ago    Pain Frequency  Intermittent    Aggravating Factors   standing still; washing dishes, driving long distances (>5 hours)    Pain Relieving Factors  sitting in recliner, overhead stretches, inversion table         [facility] PT Assessment - 10/20/17 1059      Assessment   Medical Diagnosis  thoracic spine pain    Referring Provider  Dr. [name] Deutscher    Onset Date/Surgical Date  -- 4-5 days    Hand Dominance  Right    Next MD Visit  1/23/209    Prior Therapy  years ago - Rt shoulder      Precautions   Precautions  None      Restrictions   Weight Bearing Restrictions  No  Balance Screen   Has the patient fallen in the past 6 months  No    Has the patient had a decrease in activity level because of a fear of falling?   No    Is the patient reluctant to leave their home because of a fear of falling?   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 [employer]    Leisure  golf (hasn't played in ~ 1 year); work on Equities trader   Overall Cognitive Status  Within [place] for tasks assessed      Posture/Postural Control   Posture/Postural  Control  Postural limitations    Postural Limitations  Rounded Shoulders;Forward head      ROM / Strength   AROM / PROM / Strength  Strength      Strength   Strength Assessment Site  Shoulder    Right/Left Shoulder  Right;Left    Right Shoulder Flexion  5/5    Right Shoulder Extension  5/5    Right Shoulder ABduction  5/5    Right Shoulder Internal Rotation  5/5    Right Shoulder External Rotation  5/5    Right Shoulder Horizontal ABduction  5/5    Right Shoulder Horizontal ADduction  5/5    Left Shoulder Flexion  5/5    Left Shoulder Extension  5/5    Left Shoulder ABduction  3+/5    Left Shoulder Internal Rotation  5/5    Left Shoulder External Rotation  5/5    Left Shoulder Horizontal ABduction  5/5    Left Shoulder Horizontal ADduction  5/5      Palpation   Spinal mobility  hypomobile T2/3-9/10; pain right at T8 with PA mobs    Palpation comment  no trigger points noted in rhomboids or thoracic paraspinals             Objective measurements completed on examination: See above findings.      [facility] PT Treatment/Exercise - 10/20/17 1059      Lumbar Exercises: Stretches   Quadruped Mid Back Stretch  2 reps;30 seconds    Quadruped Mid Back Stretch Limitations  mid/Lt/Rt; seated with rolling stool      Shoulder Exercises: Stretch   Other Shoulder Stretches  rhomboid stretch in doorway 2x30 sec             PT Education - 10/20/17 1150    Education provided  Yes    Education Details  HEP    Person(s) Educated  Patient    Methods  Explanation;Demonstration;Handout    Comprehension  Verbalized understanding;Returned demonstration;Need further instruction          PT Long Term Goals - 10/20/17 1155      PT LONG TERM GOAL #1   Title  independent with HEP    Status  New    [place] Date  12/01/17      PT LONG TERM GOAL #2   Title  report ability to stand and wash dishes with for at least 15 min without increase in pain for improved activity  tolerance    Status  New    [place] Date  12/01/17      PT LONG TERM GOAL #3   Title  report ability to drive long distances (to sister's house > 5 hours away) without increase in pain for improved function    Status  New    [place] Date  12/01/17             Plan - Nov 13, 2017 1152    Clinical Impression Statement  Pt is a 76 y/o male who presents to OPPT with chronic mid back pain, mostly isolated to mid thoracic spine.  Pt without trigger points and good strength in UEs without reproduction of pain.  Pt with hypomobile spine and poor posture likley contributing to pain.  Will benefit from PT to address these deficits.    History and Personal Factors relevant to plan of care:  CAD with AICD/Pacemaker (no estim), CHF, DM, HTN    Clinical Presentation  Stable    Clinical Decision Making  Low    Rehab Potential  Good    PT Frequency  1x / week    PT Duration  6 weeks    PT Treatment/Interventions  ADLs/Self Care Home Management;Cryotherapy;Moist Heat;Therapeutic exercise;Therapeutic activities;Functional mobility training;Patient/family education;Manual techniques;Taping    PT Next Visit Plan  review HEP, continue posture exercises and manual (joint mobs) PRN    Consulted and Agree with Plan of Care  Patient       Patient will benefit from skilled therapeutic intervention in order to improve the following deficits and impairments:  Hypomobility, Pain, Postural dysfunction, Impaired flexibility  Visit Diagnosis: Pain in thoracic spine - Plan: PT plan of care cert/re-cert  Abnormal posture - Plan: PT plan of care cert/re-cert  [facility] PT PB G-CODES - 11-13-2017 1157    Functional Assessment Tool Used   clinical judgement    Functional Limitations  Changing and maintaining body position    Changing and Maintaining Body Position Current Status (O8786)  At least 20 percent but less than 40 percent impaired, limited or restricted    Changing and Maintaining Body Position Goal Status (V6720)   At least 1 percent but less than 20 percent impaired, limited or restricted        Problem List Patient Active Problem List   Diagnosis Date Noted  . Allergic rhinitis 09/28/2017  . Cardiac defibrillator in place 09/28/2017  . Congestive heart failure ([place]) 09/28/2017  . CAD (coronary artery disease), native coronary artery 09/28/2017  . Hyperlipidemia 09/28/2017  . GERD (gastroesophageal reflux disease) 09/28/2017  . Mitral valve incompetence 09/28/2017  . 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 Obesity (BMI 30-39.9) 05/12/2017  . History of actinic keratoses 09/12/2015  . Chronic kidney disease 01/04/2015  . Enthesopathy of hip region 01/04/2015  . DM (diabetes mellitus), type 2 ([place]) 09/04/2013  . Hypertension associated with diabetes ([place]) 09/04/2013  . 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 Abnormal nuclear stress test 08/26/2011  . Abnormal echocardiogram 08/26/2011  . Systolic and diastolic CHF, chronic ([place]) 08/26/2011     Laureen Abrahams, PT, DPT November 13, 2017 11:59 AM    [facility] [place], [place], [address] Phone: [phone]   Fax:  [phone]  Name: CARRY ORTEZ MRN: [id number] Date of Birth: November 22, 1941

## 2017-10-20 NOTE — Patient Instructions (Signed)
Mid-Back Rotation Stretch    Sit in a sturdy chair with a rolling stool or chair in front of you.  Put hands on rolling chair in front and perform the exercises.  Reach to each side as far as possible, keeping chest low.  Hold __20-30__ seconds.  Repeat __2-3__ times per set. Do __1__ sets per session. Do __2-3__ sessions per day.   Scapular Retraction: Abduction (Prone)    Lie with upper arms straight out from sides, elbows bent to 90. Pinch shoulder blades together and raise arms a few inches from floor.  You can do this in your bed. Repeat __10__ times per set. Do __1__ sets per session. Do _1-2___ sessions per day.  Lower Cervical / Upper Thoracic Stretch    Clasp hands together in front with arms extended. Gently pull shoulder blades apart and bend head forward. Hold __20-30__ seconds.  You can do this in a doorway.  Repeat __2-3__ times per set. Do __1__ sets per session. Do _2-3___ sessions per day.

## 2017-10-25 ENCOUNTER — Other Ambulatory Visit: Payer: Self-pay

## 2017-10-25 ENCOUNTER — Encounter: Payer: Self-pay | Admitting: Family Medicine

## 2017-10-25 MED ORDER — FLUTICASONE PROPIONATE 50 MCG/ACT NA SUSP: 1.0000 | Freq: Two times a day (BID) | NASAL | 1 refills | Status: DC | PRN

## 2017-10-26 ENCOUNTER — Other Ambulatory Visit: Payer: Self-pay | Admitting: Surgical

## 2017-10-26 ENCOUNTER — Encounter: Payer: Self-pay | Admitting: Family Medicine

## 2017-10-26 MED ORDER — FREESTYLE LANCETS MISC: 1 refills | Status: DC

## 2017-10-26 MED ORDER — INSULIN ASPART 100 UNIT/ML FLEXPEN: PEN_INJECTOR | SUBCUTANEOUS | 1 refills | Status: DC

## 2017-10-26 NOTE — Telephone Encounter (Signed)
Patient calling to report that Novolog script was not sent correctly and would like a call back. [phone] home.

## 2017-11-02 ENCOUNTER — Ambulatory Visit (INDEPENDENT_AMBULATORY_CARE_PROVIDER_SITE_OTHER): Payer: Medicare Other | Admitting: Physical Therapy

## 2017-11-02 ENCOUNTER — Encounter: Payer: Self-pay | Admitting: Physical Therapy

## 2017-11-02 DIAGNOSIS — R293 Abnormal posture: Secondary | ICD-10-CM

## 2017-11-02 DIAGNOSIS — M546 Pain in thoracic spine: Secondary | ICD-10-CM

## 2017-11-02 NOTE — Therapy (Addendum)
[facility] [address] [place], [place], [address] Phone: [phone]   Fax:  [phone]  Physical Therapy Treatment/Discharge  Patient Details  Name: [name] MRN: [id number] Date of Birth: 10-23-1941 Referring Provider: Dr. [name] Deutscher   Encounter Date: 11/02/2017  PT End of Session - 11/02/17 1507    Visit Number  2    Number of Visits  6    Date for PT Re-Evaluation  12/01/17    Authorization Type  Medicare/Tricare    PT Start Time  1430    PT Stop Time  1500    PT Time Calculation (min)  30 min    Activity Tolerance  Patient tolerated treatment well    Behavior During Therapy  [facility] for tasks assessed/performed       Past Medical History:  Diagnosis Date  . Abnormal echocardiogram 08/26/2011  . Abnormal nuclear stress test 08/26/2011  . Allergic rhinitis 09/28/2017  . 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 CAD (coronary artery disease), native coronary artery 09/28/2017  . Cardiac defibrillator in place 09/28/2017  . 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 Laurance Flatten  . Congestive heart failure ([place]) 09/28/2017  . DM (diabetes mellitus), type 2 ([place]) 09/04/2013  . Enthesopathy of hip region 01/04/2015  . GERD (gastroesophageal reflux disease) 09/28/2017  . History of actinic keratoses 09/12/2015  . Hyperlipidemia 09/28/2017  . Hypertension associated with diabetes ([place]) 09/04/2013  . Mitral valve incompetence 09/28/2017  . Obesity (BMI 30-39.9) 05/12/2017  . 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 Andria Frames, Dr. Purcell Nails  . Systolic and diastolic CHF, chronic ([place]) 08/26/2011    History reviewed. No pertinent surgical history.  There were no vitals filed for this visit.  Subjective Assessment - 11/02/17 1431    Subjective  wife has had PNA so he's been taking care of her.  "It's like a mystery pain."  Has weeks of no  pain then all of a sudden pain returns and lasts for hours to a day. No pain x 1 week.  Thinks it may be due to how he's sleeping.    How long can you stand comfortably?  < 5 min    Patient Stated Goals  drive without pain    Pain Score  0-No pain                      [facility] Adult PT Treatment/Exercise - 11/02/17 1438      Self-Care   Self-Care  Other Self-Care Comments    Other Self-Care Comments   proper sleep positions      Lumbar Exercises: Stretches   Quadruped Mid Back Stretch  2 reps;30 seconds    Quadruped Mid Back Stretch Limitations  mid/Lt/Rt; seated with rolling stool      Lumbar Exercises: Prone   Other Prone Lumbar Exercises  "W" arms with 3-5 sec hold x 10 reps      Shoulder Exercises: Stretch   Other Shoulder Stretches  rhomboid stretch in doorway 2x30 sec    Other Shoulder Stretches  thoracic extension over noodle with chest stretch             PT Education - 11/02/17 1503    Education provided  Yes    Education Details  posture/body Office manager; HEP    Person(s) Educated  Patient    Methods  Explanation;Demonstration;Handout    Comprehension  Verbalized understanding          PT Long Term Goals - 11/02/17 1507      PT LONG TERM GOAL #1   Title  independent with HEP    Baseline  met 11/27    Status  Achieved      PT LONG TERM GOAL #2   Title  report ability to stand and wash dishes with for at least 15 min without increase in pain for improved activity tolerance    Status  On-going      PT LONG TERM GOAL #3   Title  report ability to drive long distances (to sister's house > 5 hours away) without increase in pain for improved function    Status  On-going            Plan - 11/02/17 1507    Clinical Impression Statement  Pt reports no pain since last session and compliant with HEP.  Progressing well towards goals.  Will plan to see PRN over next 4-6 weeks.  If pt remains pain free will d/c.      PT Treatment/Interventions   ADLs/Self Care Home Management;Cryotherapy;Moist Heat;Therapeutic exercise;Therapeutic activities;Functional mobility training;Patient/family education;Manual techniques;Taping    PT Next Visit Plan  continue posture exercises and manual (joint mobs) PRN    Consulted and Agree with Plan of Care  Patient       Patient will benefit from skilled therapeutic intervention in order to improve the following deficits and impairments:  Hypomobility, Pain, Postural dysfunction, Impaired flexibility  Visit Diagnosis: Pain in thoracic spine  Abnormal posture     Problem List Patient Active Problem List   Diagnosis Date Noted  . Allergic rhinitis 09/28/2017  . Cardiac defibrillator in place 09/28/2017  . Congestive heart failure ([place]) 09/28/2017  . CAD (coronary artery disease), native coronary artery 09/28/2017  . Hyperlipidemia 09/28/2017  . GERD (gastroesophageal reflux disease) 09/28/2017  . Mitral valve incompetence 09/28/2017  . 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 Obesity (BMI 30-39.9) 05/12/2017  . History of actinic keratoses 09/12/2015  . Chronic kidney disease 01/04/2015  . Enthesopathy of hip region 01/04/2015  . DM (diabetes mellitus), type 2 ([place]) 09/04/2013  . Hypertension associated with diabetes ([place]) 09/04/2013  . 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 Abnormal nuclear stress test 08/26/2011  . Abnormal echocardiogram 08/26/2011  . Systolic and diastolic CHF, chronic ([place]) 08/26/2011      Laureen Abrahams, PT, DPT 11/02/17 3:09 PM    [facility] [address] [place], [place], [address] Phone: [phone]   Fax:  [phone]  Name: [name] MRN: [id number] Date of Birth: 1941-04-22      PHYSICAL THERAPY DISCHARGE SUMMARY  Visits from Start of Care: 2  Current functional level related to goals / functional outcomes: See above   Remaining deficits: See above; held PT due to no  pain and pt did not return so assume pt still without pain   Education / Equipment: HEP  Plan: Patient agrees to discharge.  Patient goals were not met. Patient is being discharged due to not returning since the last visit.  ?????    Laureen Abrahams, PT, DPT 01/07/18 11:10 AM   [facility] [address] [place], [place], [address] Phone: [phone]  Fax: [phone]

## 2017-11-02 NOTE — Patient Instructions (Addendum)

## 2017-11-08 ENCOUNTER — Other Ambulatory Visit (INDEPENDENT_AMBULATORY_CARE_PROVIDER_SITE_OTHER): Payer: Medicare Other

## 2017-11-08 ENCOUNTER — Other Ambulatory Visit: Payer: Self-pay

## 2017-11-08 ENCOUNTER — Telehealth: Payer: Self-pay | Admitting: Radiology

## 2017-11-08 DIAGNOSIS — E1122 Type 2 diabetes mellitus with diabetic chronic kidney disease: Secondary | ICD-10-CM

## 2017-11-08 DIAGNOSIS — R739 Hyperglycemia, unspecified: Secondary | ICD-10-CM

## 2017-11-08 DIAGNOSIS — Z794 Long term (current) use of insulin: Secondary | ICD-10-CM | POA: Diagnosis not present

## 2017-11-08 LAB — HEMOGLOBIN A1C: Hgb A1c MFr Bld: 7.4 % — ABNORMAL HIGH (ref 4.6–6.5)

## 2017-11-08 NOTE — Telephone Encounter (Signed)
Do you want me to hold for office visit so that we can add or have patient come in before then to have re done?

## 2017-11-08 NOTE — Telephone Encounter (Signed)
The Pt came for his lab appointment at 8:35 this morning. I can add the CBC on to the tube drawn for the lavender this morning but I did not draw for a CMP.

## 2017-11-08 NOTE — Addendum Note (Signed)
Addended by: Durwin Glaze on: 11/08/2017 10:28 AM   Modules accepted: Orders

## 2017-11-08 NOTE — Telephone Encounter (Signed)
CBC, CMP, A1C. I wanted him to see the DM Educator as well.

## 2017-11-08 NOTE — Telephone Encounter (Signed)
AIC ordered per lab app. notes are their any other labs you want me to add.

## 2017-11-08 NOTE — Telephone Encounter (Signed)
Pt is coming in for a lab appt at 8:45 today. Please place future lab orders. Thanks!

## 2017-11-08 NOTE — Telephone Encounter (Signed)
Orders have been placed.

## 2017-11-09 NOTE — Telephone Encounter (Signed)
Will get further labs when he comes in for DM Educator.

## 2017-11-12 ENCOUNTER — Encounter: Payer: Self-pay | Admitting: Family Medicine

## 2017-11-12 ENCOUNTER — Ambulatory Visit (INDEPENDENT_AMBULATORY_CARE_PROVIDER_SITE_OTHER): Payer: Medicare Other | Admitting: Family Medicine

## 2017-11-12 VITALS — BP 112/70 | HR 71 | Temp 98.6°F | Wt 236.4 lb

## 2017-11-12 DIAGNOSIS — E1122 Type 2 diabetes mellitus with diabetic chronic kidney disease: Secondary | ICD-10-CM

## 2017-11-12 DIAGNOSIS — Z794 Long term (current) use of insulin: Secondary | ICD-10-CM | POA: Diagnosis not present

## 2017-11-12 DIAGNOSIS — D696 Thrombocytopenia, unspecified: Secondary | ICD-10-CM

## 2017-11-12 DIAGNOSIS — N182 Chronic kidney disease, stage 2 (mild): Secondary | ICD-10-CM | POA: Diagnosis not present

## 2017-11-12 LAB — CBC WITH DIFFERENTIAL/PLATELET
Basophils Absolute: 0 10*3/uL (ref 0.0–0.1)
Basophils Relative: 0.3 % (ref 0.0–3.0)
Eosinophils Absolute: 0.1 10*3/uL (ref 0.0–0.7)
Eosinophils Relative: 0.8 % (ref 0.0–5.0)
HCT: 40.4 % (ref 39.0–52.0)
Hemoglobin: 13.8 g/dL (ref 13.0–17.0)
Lymphocytes Relative: 17.1 % (ref 12.0–46.0)
Lymphs Abs: 1.3 10*3/uL (ref 0.7–4.0)
MCHC: 34.1 g/dL (ref 30.0–36.0)
MCV: 87.5 fl (ref 78.0–100.0)
Monocytes Absolute: 0.7 10*3/uL (ref 0.1–1.0)
Monocytes Relative: 9.6 % (ref 3.0–12.0)
Neutro Abs: 5.6 10*3/uL (ref 1.4–7.7)
Neutrophils Relative %: 72.2 % (ref 43.0–77.0)
Platelets: 125 10*3/uL — ABNORMAL LOW (ref 150.0–400.0)
RBC: 4.62 Mil/uL (ref 4.22–5.81)
RDW: 14.2 % (ref 11.5–15.5)
WBC: 7.8 10*3/uL (ref 4.0–10.5)

## 2017-11-12 LAB — COMPREHENSIVE METABOLIC PANEL
ALT: 9 U/L (ref 0–53)
AST: 13 U/L (ref 0–37)
Albumin: 3.9 g/dL (ref 3.5–5.2)
Alkaline Phosphatase: 77 U/L (ref 39–117)
BUN: 22 mg/dL (ref 6–23)
CO2: 31 mEq/L (ref 19–32)
Calcium: 9.3 mg/dL (ref 8.4–10.5)
Chloride: 100 mEq/L (ref 96–112)
Creatinine, Ser: 1.62 mg/dL — ABNORMAL HIGH (ref 0.40–1.50)
GFR: 44.22 mL/min — ABNORMAL LOW (ref >60.00)
Glucose, Bld: 138 mg/dL — ABNORMAL HIGH (ref 70–99)
Potassium: 4 mEq/L (ref 3.5–5.1)
Sodium: 139 mEq/L (ref 135–145)
Total Bilirubin: 0.7 mg/dL (ref 0.2–1.2)
Total Protein: 6.1 g/dL (ref 6.0–8.3)

## 2017-11-12 LAB — MICROALBUMIN / CREATININE URINE RATIO
Creatinine,U: 87.2 mg/dL
Microalb Creat Ratio: 0.8 mg/g (ref 0.0–30.0)
Microalb, Ur: 0.7 mg/dL (ref 0.0–1.9)

## 2017-11-12 NOTE — Progress Notes (Addendum)
Richard VICKERMAN is a 76 y.o. male is here for follow up.  History of Present Illness:   HPI: See Assessment and Plan section for Problem Based Charting of issues discussed today.  Health Maintenance Due  Topic Date Due  . FOOT EXAM  08/03/1951  . OPHTHALMOLOGY EXAM  08/03/1951   Depression screen PHQ 2/9 09/28/2017  Decreased Interest 0  Down, Depressed, Hopeless 0  PHQ - 2 Score 0   PMHx, SurgHx, SocialHx, FamHx, Medications, and Allergies were reviewed in the Visit Navigator and updated as appropriate.   Patient Active Problem List   Diagnosis Date Noted  . Thrombocytopenia ([place]) 11/15/2017  . Allergic rhinitis 09/28/2017  . Cardiac defibrillator in place 09/28/2017  . Congestive heart failure ([place]) 09/28/2017  . CAD (coronary artery disease), native coronary artery 09/28/2017  . Hyperlipidemia 09/28/2017  . GERD (gastroesophageal reflux disease) 09/28/2017  . Mitral valve incompetence 09/28/2017  . 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 Obesity (BMI 30-39.9) 05/12/2017  . History of actinic keratoses 09/12/2015  . Chronic kidney disease 01/04/2015  . Enthesopathy of hip region 01/04/2015  . DM (diabetes mellitus), type 2 ([place]) 09/04/2013  . Hypertension associated with diabetes ([place]) 09/04/2013  . 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 Abnormal nuclear stress test 08/26/2011  . Abnormal echocardiogram 08/26/2011  . Systolic and diastolic CHF, chronic ([place]) 08/26/2011   Social History   Tobacco Use  . Smoking status: Former Research scientist (life sciences)  . Smokeless tobacco: Never Used  Substance Use Topics  . Alcohol use: No    Frequency: Never  . Drug use: No   Current Medications and Allergies:   .  aspirin 325 MG tablet, Take 325 mg by mouth., Disp: , Rfl:  .  Azelastine HCl 0.15 % SOLN, Place 1 spray daily into both nostrils., Disp: 90 mL, Rfl: 1 .  carvedilol (COREG) 25 MG tablet, TAKE 1 TABLET TWICE A DAY, Disp: , Rfl:  .  digoxin (LANOXIN) 0.125 MG tablet,  Take 1 tablet (0.125 mg total) daily by mouth., Disp: 90 tablet, Rfl: 1 .  ENTRESTO 49-51 MG, , Disp: , Rfl:  .  ezetimibe-simvastatin (VYTORIN) 10-40 MG tablet, Take 1 tablet daily by mouth., Disp: 90 tablet, Rfl: 1 .  fluticasone (FLONASE) 50 MCG/ACT nasal spray, Place 1 spray 2 (two) times daily as needed into both nostrils for allergies or rhinitis., Disp: 48 g, Rfl: 1 .  furosemide (LASIX) 40 MG tablet, Take 1 tablet (40 mg total) 2 (two) times daily by mouth., Disp: 180 tablet, Rfl: 1 .  guaiFENesin-codeine (CHERATUSSIN AC) 100-10 MG/5ML syrup, Take 10 mLs at bedtime as needed by mouth for cough or congestion., Disp: 120 mL, Rfl: 0 .  insulin aspart (NOVOLOG) 100 UNIT/ML FlexPen, INJECT 30 UNITS UNDER THE SKIN THREE TIMES A DAY WITH MEALS, Disp: 35 pen, Rfl: 1 .  Insulin Glargine (LANTUS SOLOSTAR) 100 UNIT/ML Solostar Pen, INJECT 68 UNITS UNDER THE SKIN DAILY, Disp: , Rfl:  .  isosorbide mononitrate (IMDUR) 30 MG 24 hr tablet, Take 30 mg by mouth., Disp: , Rfl:  .  nitroGLYCERIN (NITROSTAT) 0.4 MG SL tablet, Place 0.4 mg under the tongue., Disp: , Rfl:  .  omeprazole (PRILOSEC) 40 MG capsule, Take 1 capsule (40 mg total) daily by mouth., Disp: 90 capsule, Rfl: 1 .  potassium chloride SA (KLOR-CON M20) 20 MEQ tablet, TAKE 1 TABLET DAILY WITH LASIX, Disp: 90 tablet, Rfl: 1 .  ranitidine (ZANTAC) 150 MG capsule, Take by mouth., Disp: ,  Rfl:   No Known Allergies   Review of Systems   Pertinent items are noted in the HPI. Otherwise, ROS is negative.  Vitals:   Vitals:   11/12/17 1000  BP: 112/70  Pulse: 71  Temp: 98.6 F (37 C)  TempSrc: Oral  SpO2: 94%  Weight: 236 lb 6.4 oz (107.2 kg)     Body mass index is 32.06 kg/m.   Physical Exam:   Physical Exam  Constitutional: He is oriented to person, place, and time. He appears well-developed and well-nourished. No distress.  HENT:  Head: Normocephalic and atraumatic.  Right Ear: External ear normal.  Left Ear: External ear  normal.  Nose: Nose normal.  Mouth/Throat: Oropharynx is clear and moist.  Eyes: Conjunctivae and EOM are normal. Pupils are equal, round, and reactive to light.  Neck: Normal range of motion. Neck supple.  Cardiovascular: Normal rate, regular rhythm, normal heart sounds and intact distal pulses.  Pulmonary/Chest: Effort normal and breath sounds normal.  Abdominal: Soft. Bowel sounds are normal.  Musculoskeletal: Normal range of motion.  Neurological: He is alert and oriented to person, place, and time.  Skin: Skin is warm and dry.  Psychiatric: He has a normal mood and affect. His behavior is normal. Judgment and thought content normal.  Nursing note and vitals reviewed.    Results for orders placed or performed in visit on 11/12/17  Microalbumin / creatinine urine ratio  Result Value Ref Range   Microalb, Ur 0.7 0.0 - 1.9 mg/dL   Creatinine,U 87.2 mg/dL   Microalb Creat Ratio 0.8 0.0 - 30.0 mg/g  Comprehensive metabolic panel  Result Value Ref Range   Sodium 139 135 - 145 mEq/L   Potassium 4.0 3.5 - 5.1 mEq/L   Chloride 100 96 - 112 mEq/L   CO2 31 19 - 32 mEq/L   Glucose, Bld 138 (H) 70 - 99 mg/dL   BUN 22 6 - 23 mg/dL   Creatinine, Ser 1.62 (H) 0.40 - 1.50 mg/dL   Total Bilirubin 0.7 0.2 - 1.2 mg/dL   Alkaline Phosphatase 77 39 - 117 U/L   AST 13 0 - 37 U/L   ALT 9 0 - 53 U/L   Total Protein 6.1 6.0 - 8.3 g/dL   Albumin 3.9 3.5 - 5.2 g/dL   Calcium 9.3 8.4 - 10.5 mg/dL   GFR 44.22 (L) >60.00 mL/min  CBC with Differential/Platelet  Result Value Ref Range   WBC 7.8 4.0 - 10.5 K/uL   RBC 4.62 4.22 - 5.81 Mil/uL   Hemoglobin 13.8 13.0 - 17.0 g/dL   HCT 40.4 39.0 - 52.0 %   MCV 87.5 78.0 - 100.0 fl   MCHC 34.1 30.0 - 36.0 g/dL   RDW 14.2 11.5 - 15.5 %   Platelets 125.0 (L) 150.0 - 400.0 K/uL   Neutrophils Relative % 72.2 43.0 - 77.0 %   Lymphocytes Relative 17.1 12.0 - 46.0 %   Monocytes Relative 9.6 3.0 - 12.0 %   Eosinophils Relative 0.8 0.0 - 5.0 %   Basophils  Relative 0.3 0.0 - 3.0 %   Neutro Abs 5.6 1.4 - 7.7 K/uL   Lymphs Abs 1.3 0.7 - 4.0 K/uL   Monocytes Absolute 0.7 0.1 - 1.0 K/uL   Eosinophils Absolute 0.1 0.0 - 0.7 K/uL   Basophils Absolute 0.0 0.0 - 0.1 K/uL    Assessment and Plan:   Barrett was seen today for follow-up.  Diagnoses and all orders for this visit:  Type 2 diabetes  mellitus with chronic kidney disease, with long-term current use of insulin, unspecified CKD stage (HCC) Comments: Current symptoms: no polyuria or polydipsia, no chest pain, dyspnea or TIA's, no numbness, tingling or pain in extremities.  Taking medication compliantly without noted sided effects '[x]'$   YES  '[]'$   NO  Episodes of hypoglycemia? '[]'$   YES  '[x]'$   NO Maintaining a diabetic diet? '[]'$   YES  '[x]'$   NO Trying to exercise on a regular basis? '[]'$   YES  '[x]'$   NO  On ACE inhibitor or angiotensin II receptor blocker? '[]'$   YES  '[x]'$   NO On Aspirin? '[x]'$   YES  '[]'$   NO  Met with DM Educator in office yesterday. Discussed Ozempic as an option (along with [place] as another). He would like to try Ozempic. Sample provided today. Instructions to advance with precautions and plan to decrease SSI, then Lantus. Red flags reviewed. Follow up in 4-6 weeks.   Lab Results  Component Value Date   HGBA1C 7.4 (H) 11/08/2017    Lab Results  Component Value Date   MICROALBUR 0.7 11/12/2017    No results found for: CHOL, HDL, LDLCALC, LDLDIRECT, TRIG, CHOLHDL    Wt Readings from Last 3 Encounters:  11/12/17 236 lb 6.4 oz (107.2 kg)  10/11/17 233 lb (105.7 kg)  09/28/17 237 lb (107.5 kg)   BP Readings from Last 3 Encounters:  11/12/17 112/70  10/11/17 112/68  09/28/17 110/64   Lab Results  Component Value Date   CREATININE 1.62 (H) 11/12/2017   Orders: -     Microalbumin / creatinine urine ratio  Morbid obesity (HCC) Comments: The patient is asked to make an attempt to improve diet and exercise patterns to aid in medical management of this problem. See  above. Orders: -     Comprehensive metabolic panel -     CBC with Differential/Platelet  Stage 2 chronic kidney disease Comments:  Lab Results  Component Value Date   CREATININE 1.62 (H) 11/12/2017     Thrombocytopenia (HCC) Comments:  CBC Latest Ref Rng & Units 11/12/2017  WBC 4.0 - 10.5 K/uL 7.8  Hemoglobin 13.0 - 17.0 g/dL 13.8  Hematocrit 39.0 - 52.0 % 40.4  Platelets 150.0 - 400.0 K/uL 125.0(L)    No history of the same. Will monitor closely.   . Reviewed expectations re: course of current medical issues. . Discussed self-management of symptoms. . Outlined signs and symptoms indicating need for more acute intervention. . Patient verbalized understanding and all questions were answered. Marland Kitchen Health Maintenance issues including appropriate healthy diet, exercise, and smoking avoidance were discussed with patient. . See orders for this visit as documented in the electronic medical record. . Patient received an After Visit Summary.  [name] Deutscher, DO [place] 11/15/2017  Future Appointments  Date Time Provider [place]  12/24/2017  9:00 AM [name] Deutscher, DO [facility]  02/10/2018  8:15 AM [facility] LAB [facility]  02/14/2018  1:00 PM [name] Deutscher, DO [facility]

## 2017-11-15 ENCOUNTER — Encounter: Payer: Self-pay | Admitting: Family Medicine

## 2017-11-15 DIAGNOSIS — D696 Thrombocytopenia, unspecified: Secondary | ICD-10-CM | POA: Insufficient documentation

## 2017-11-15 NOTE — Patient Instructions (Signed)
Start Ozempic. Monitor post prandial and fasting blood sugars carefully. The medication will take about two weeks to work. Decrease Novolog first (and quickly). Decrease Lantus as discussed. It is okay to have higher blood sugars while we are adjusting your regimen.

## 2017-11-19 DIAGNOSIS — I509 Heart failure, unspecified: Secondary | ICD-10-CM | POA: Diagnosis not present

## 2017-11-19 DIAGNOSIS — I340 Nonrheumatic mitral (valve) insufficiency: Secondary | ICD-10-CM | POA: Diagnosis not present

## 2017-11-19 DIAGNOSIS — I255 Ischemic cardiomyopathy: Secondary | ICD-10-CM | POA: Diagnosis not present

## 2017-11-19 DIAGNOSIS — Z4502 Encounter for adjustment and management of automatic implantable cardiac defibrillator: Secondary | ICD-10-CM | POA: Diagnosis not present

## 2017-11-19 DIAGNOSIS — Z95810 Presence of automatic (implantable) cardiac defibrillator: Secondary | ICD-10-CM | POA: Diagnosis not present

## 2017-11-19 DIAGNOSIS — I10 Essential (primary) hypertension: Secondary | ICD-10-CM | POA: Diagnosis not present

## 2017-12-09 ENCOUNTER — Encounter: Payer: Self-pay | Admitting: Family Medicine

## 2017-12-09 ENCOUNTER — Other Ambulatory Visit: Payer: Self-pay | Admitting: Surgical

## 2017-12-09 MED ORDER — SEMAGLUTIDE(0.25 OR 0.5MG/DOS) 2 MG/1.5ML ~~LOC~~ SOPN: 0.5000 mg | PEN_INJECTOR | SUBCUTANEOUS | 2 refills | Status: DC

## 2017-12-24 ENCOUNTER — Ambulatory Visit (INDEPENDENT_AMBULATORY_CARE_PROVIDER_SITE_OTHER): Payer: Medicare Other | Admitting: Family Medicine

## 2017-12-24 ENCOUNTER — Encounter: Payer: Self-pay | Admitting: Family Medicine

## 2017-12-24 VITALS — BP 120/68 | HR 77 | Temp 98.6°F | Wt 234.8 lb

## 2017-12-24 DIAGNOSIS — E1122 Type 2 diabetes mellitus with diabetic chronic kidney disease: Secondary | ICD-10-CM | POA: Diagnosis not present

## 2017-12-24 DIAGNOSIS — D696 Thrombocytopenia, unspecified: Secondary | ICD-10-CM

## 2017-12-24 DIAGNOSIS — E1159 Type 2 diabetes mellitus with other circulatory complications: Secondary | ICD-10-CM

## 2017-12-24 DIAGNOSIS — I10 Essential (primary) hypertension: Secondary | ICD-10-CM

## 2017-12-24 DIAGNOSIS — Z794 Long term (current) use of insulin: Secondary | ICD-10-CM | POA: Diagnosis not present

## 2017-12-24 DIAGNOSIS — K219 Gastro-esophageal reflux disease without esophagitis: Secondary | ICD-10-CM | POA: Diagnosis not present

## 2017-12-24 DIAGNOSIS — I5042 Chronic combined systolic (congestive) and diastolic (congestive) heart failure: Secondary | ICD-10-CM

## 2017-12-24 DIAGNOSIS — L989 Disorder of the skin and subcutaneous tissue, unspecified: Secondary | ICD-10-CM

## 2017-12-24 DIAGNOSIS — I152 Hypertension secondary to endocrine disorders: Secondary | ICD-10-CM

## 2017-12-24 DIAGNOSIS — N182 Chronic kidney disease, stage 2 (mild): Secondary | ICD-10-CM | POA: Diagnosis not present

## 2017-12-24 MED ORDER — RANITIDINE HCL 150 MG PO CAPS: 150.0000 mg | ORAL_CAPSULE | Freq: Every evening | ORAL | 3 refills | Status: DC

## 2017-12-24 MED ORDER — PANTOPRAZOLE SODIUM 40 MG PO TBEC: 40.0000 mg | DELAYED_RELEASE_TABLET | Freq: Every day | ORAL | 3 refills | Status: DC

## 2017-12-24 NOTE — Progress Notes (Signed)
[name] is a 77 y.o. male is here for follow up.  History of Present Illness:   HPI:  1. Hypertension associated with diabetes ([place]).   Avoiding excessive salt intake? [x]   YES  []   NO Trying to exercise on a regular basis? []   YES  [x]   NO Review: taking medications as instructed, no medication side effects noted, no TIAs, no chest pain on exertion, no dyspnea on exertion, no swelling of ankles.  Smoker: No.  Wt Readings from Last 3 Encounters:  12/24/17 234 lb 12.8 oz (106.5 kg)  11/12/17 236 lb 6.4 oz (107.2 kg)  10/11/17 233 lb (105.7 kg)   BP Readings from Last 3 Encounters:  12/24/17 120/68  11/12/17 112/70  10/11/17 112/68   Lab Results  Component Value Date   CREATININE 1.62 (H) 11/12/2017     2. Type 2 diabetes mellitus with chronic kidney disease, with long-term current use of insulin, unspecified CKD stage ([place]).   Current symptoms: no polyuria or polydipsia, no chest pain, dyspnea or TIA's.  Taking medication compliantly without noted sided effects []   YES  []   NO  Episodes of hypoglycemia? []   YES  [x]   NO Maintaining a diabetic diet? []   YES  [x]   NO Trying to exercise on a regular basis? []   YES  [x]   NO   On ACE inhibitor or angiotensin II receptor blocker? []   YES  [x]   NO On Aspirin? [x]   YES  []   NO  Lab Results  Component Value Date   HGBA1C 7.4 (H) 11/08/2017    Lab Results  Component Value Date   MICROALBUR 0.7 11/12/2017    No results found for: CHOL, HDL, LDLCALC, LDLDIRECT, TRIG, CHOLHDL    Wt Readings from Last 3 Encounters:  12/24/17 234 lb 12.8 oz (106.5 kg)  11/12/17 236 lb 6.4 oz (107.2 kg)  10/11/17 233 lb (105.7 kg)   BP Readings from Last 3 Encounters:  12/24/17 120/68  11/12/17 112/70  10/11/17 112/68   Lab Results  Component Value Date   CREATININE 1.62 (H) 11/12/2017      Health Maintenance Due  Topic Date Due  . OPHTHALMOLOGY EXAM  08/03/1951   Depression screen PHQ 2/9 09/28/2017  Decreased Interest 0    Down, Depressed, Hopeless 0  PHQ - 2 Score 0   PMHx, SurgHx, SocialHx, FamHx, Medications, and Allergies were reviewed in the Visit Navigator and updated as appropriate.   Patient Active Problem List   Diagnosis Date Noted  . Morbid obesity ([place]) 12/25/2017  . Thrombocytopenia ([place]) 11/15/2017  . Allergic rhinitis 09/28/2017  . Cardiac defibrillator in place 09/28/2017  . Congestive heart failure ([place]) 09/28/2017  . CAD (coronary artery disease), native coronary artery 09/28/2017  . Hyperlipidemia 09/28/2017  . GERD (gastroesophageal reflux disease) 09/28/2017  . Mitral valve incompetence 09/28/2017  . 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 Obesity (BMI 30-39.9) 05/12/2017  . History of actinic keratoses 09/12/2015  . Chronic kidney disease 01/04/2015  . Enthesopathy of hip region 01/04/2015  . DM (diabetes mellitus), type 2 ([place]) 09/04/2013  . Hypertension associated with diabetes ([place]) 09/04/2013  . 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 Abnormal nuclear stress test 08/26/2011  . Abnormal echocardiogram 08/26/2011  . Systolic and diastolic CHF, chronic ([place]) 08/26/2011   Social History   Tobacco Use  . Smoking status: Former Research scientist (life sciences)  . Smokeless tobacco: Never Used  Substance Use Topics  . Alcohol use: No    Frequency:  Never  . Drug use: No   Current Medications and Allergies:   .  aspirin 325 MG tablet, Take 325 mg by mouth., Disp: , Rfl:  .  Azelastine HCl 0.15 % SOLN, Place 1 spray daily into both nostrils., Disp: 90 mL, Rfl: 1 .  carvedilol (COREG) 25 MG tablet, TAKE 1 TABLET TWICE A DAY, Disp: , Rfl:  .  digoxin (LANOXIN) 0.125 MG tablet, Take 1 tablet (0.125 mg total) daily by mouth., Disp: 90 tablet, Rfl: 1 .  ENTRESTO 49-51 MG, , Disp: , Rfl:  .  ezetimibe-simvastatin (VYTORIN) 10-40 MG tablet, Take 1 tablet daily by mouth., Disp: 90 tablet, Rfl: 1 .  fluticasone (FLONASE) 50 MCG/ACT nasal spray, Place 1 spray 2 (two) times daily as needed into  both nostrils for allergies or rhinitis., Disp: 48 g, Rfl: 1 .  furosemide (LASIX) 40 MG tablet, Take 1 tablet (40 mg total) 2 (two) times daily by mouth., Disp: 180 tablet, Rfl: 1 .  guaiFENesin-codeine (CHERATUSSIN AC) 100-10 MG/5ML syrup, Take 10 mLs at bedtime as needed by mouth for cough or congestion., Disp: 120 mL, Rfl: 0 .  insulin aspart (NOVOLOG) 100 UNIT/ML FlexPen, INJECT 30 UNITS UNDER THE SKIN THREE TIMES A DAY WITH MEALS, Disp: 35 pen, Rfl: 1 .  Insulin Glargine (LANTUS SOLOSTAR) 100 UNIT/ML Solostar Pen, INJECT 68 UNITS UNDER THE SKIN DAILY, Disp: , Rfl:  .  isosorbide mononitrate (IMDUR) 30 MG 24 hr tablet, Take 30 mg by mouth., Disp: , Rfl:  .  Lancets (FREESTYLE) lancets, Use as instructed, Disp: 400 each, Rfl: 1 .  nitroGLYCERIN (NITROSTAT) 0.4 MG SL tablet, Place 0.4 mg under the tongue., Disp: , Rfl:  .  omeprazole (PRILOSEC) 40 MG capsule, Take 1 capsule (40 mg total) daily by mouth., Disp: 90 capsule, Rfl: 1 .  potassium chloride SA (KLOR-CON M20) 20 MEQ tablet, TAKE 1 TABLET DAILY WITH LASIX, Disp: 90 tablet, Rfl: 1 .  ranitidine (ZANTAC) 150 MG capsule, Take by mouth., Disp: , Rfl:  .  Semaglutide (OZEMPIC) 0.25 or 0.5 MG/DOSE SOPN, Inject 0.5 mg into the skin once a week., Disp: 4 pen, Rfl: 2 .  Zoster Vaccine Live, PF, (ZOSTAVAX) 58099 UNT/0.65ML injection, TO BE ADMINISTERED BY PHARMACIST FOR IMMUNIZATION, Disp: , Rfl:   No Known Allergies   Review of Systems   Pertinent items are noted in the HPI. Otherwise, ROS is negative.  Vitals:   Vitals:   12/24/17 0902  BP: 120/68  Pulse: 77  Temp: 98.6 F (37 C)  TempSrc: Oral  SpO2: 99%  Weight: 234 lb 12.8 oz (106.5 kg)     Body mass index is 31.84 kg/m.   Physical Exam:   Physical Exam  Constitutional: He is oriented to person, place, and time. He appears well-developed and well-nourished. No distress.  HENT:  Head: Normocephalic and atraumatic.  Right Ear: External ear normal.  Left Ear: External ear  normal.  Nose: Nose normal.  Mouth/Throat: Oropharynx is clear and moist.  Eyes: Conjunctivae and EOM are normal. Pupils are equal, round, and reactive to light.  Neck: Normal range of motion. Neck supple.  Cardiovascular: Normal rate, regular rhythm, normal heart sounds and intact distal pulses.  Pulmonary/Chest: Effort normal and breath sounds normal.  Abdominal: Soft. Bowel sounds are normal.  Musculoskeletal: Normal range of motion.  Neurological: He is alert and oriented to person, place, and time.  Skin: Skin is warm and dry.  Psychiatric: He has a normal mood and affect. His behavior  is normal. Judgment and thought content normal.  Nursing note and vitals reviewed.   Diabetic Foot Exam - Simple   Simple Foot Form Diabetic Foot exam was performed with the following findings:  Yes 12/25/2017 12:52 PM  Visual Inspection No deformities, no ulcerations, no other skin breakdown bilaterally:  Yes Sensation Testing Intact to touch and monofilament testing bilaterally:  Yes Pulse Check Posterior Tibialis and Dorsalis pulse intact bilaterally:  Yes Comments     Results for orders placed or performed in visit on 11/12/17  Microalbumin / creatinine urine ratio  Result Value Ref Range   Microalb, Ur 0.7 0.0 - 1.9 mg/dL   Creatinine,U 87.2 mg/dL   Microalb Creat Ratio 0.8 0.0 - 30.0 mg/g  Comprehensive metabolic panel  Result Value Ref Range   Sodium 139 135 - 145 mEq/L   Potassium 4.0 3.5 - 5.1 mEq/L   Chloride 100 96 - 112 mEq/L   CO2 31 19 - 32 mEq/L   Glucose, Bld 138 (H) 70 - 99 mg/dL   BUN 22 6 - 23 mg/dL   Creatinine, Ser 1.62 (H) 0.40 - 1.50 mg/dL   Total Bilirubin 0.7 0.2 - 1.2 mg/dL   Alkaline Phosphatase 77 39 - 117 U/L   AST 13 0 - 37 U/L   ALT 9 0 - 53 U/L   Total Protein 6.1 6.0 - 8.3 g/dL   Albumin 3.9 3.5 - 5.2 g/dL   Calcium 9.3 8.4 - 10.5 mg/dL   GFR 44.22 (L) >60.00 mL/min  CBC with Differential/Platelet  Result Value Ref Range   WBC 7.8 4.0 - 10.5 K/uL     RBC 4.62 4.22 - 5.81 Mil/uL   Hemoglobin 13.8 13.0 - 17.0 g/dL   HCT 40.4 39.0 - 52.0 %   MCV 87.5 78.0 - 100.0 fl   MCHC 34.1 30.0 - 36.0 g/dL   RDW 14.2 11.5 - 15.5 %   Platelets 125.0 (L) 150.0 - 400.0 K/uL   Neutrophils Relative % 72.2 43.0 - 77.0 %   Lymphocytes Relative 17.1 12.0 - 46.0 %   Monocytes Relative 9.6 3.0 - 12.0 %   Eosinophils Relative 0.8 0.0 - 5.0 %   Basophils Relative 0.3 0.0 - 3.0 %   Neutro Abs 5.6 1.4 - 7.7 K/uL   Lymphs Abs 1.3 0.7 - 4.0 K/uL   Monocytes Absolute 0.7 0.1 - 1.0 K/uL   Eosinophils Absolute 0.1 0.0 - 0.7 K/uL   Basophils Absolute 0.0 0.0 - 0.1 K/uL   Lab Results  Component Value Date   HGBA1C 7.4 (H) 11/08/2017   Assessment and Plan:   Curly was seen today for follow-up.  Diagnoses and all orders for this visit:  Hypertension associated with diabetes ([place]) Comments: Use of agents associated with hypertension: none.   Cardiovascular risk factors: advanced age (older than 46 for men, 44 for women), diabetes mellitus, dyslipidemia, hypertension, male gender, obesity (BMI >= 30 kg/m2) and sedentary lifestyle.  History of target organ damage: chronic kidney disease and heart failure.  Medication: no change. Dietary sodium restriction. Regular aerobic exercise. Check blood pressures daily and record. Follow up: 3 months and as needed..  Type 2 diabetes mellitus with chronic kidney disease, with long-term current use of insulin, unspecified CKD stage ([place]) Comments: Assessment and Plan: 1.  Diabetes. Discussed general issues about diabetes pathophysiology and management. Counseling at today's visit: reviewed appropriate BG goals for age, ways to split the Lantus to BID dosing, offered to order a CGM - he declined  today due to cost. Discussed foot care. Reminded to get yearly retinal exam. Discussed ways to avoid symptomatic hypoglycemia. Reminded to bring in blood sugar diary at next visit. Follow up in 3 months or as  needed..  Recommendations: 1.  Patient is counseled on appropriate foot care. 2.  BP goal < 130/80. 3.  LDL goal of < 100, HDL > 40 and TG < 150.  4.  Eye Exam yearly and Dental Exam every 6 months. 5.  Dietary recommendations:  Decrease simple carbohydrates. 6.  Physical Activity recommendations:  Move daily. 7.  Pneumovax at diagnosis and once 65+. Wait five years between first dose and dose after 65.  8.  Influenza annually.    Diabetes Health Maintenance Due  Topic Date Due  . OPHTHALMOLOGY EXAM  08/03/1951  . HEMOGLOBIN A1C  05/09/2018  . URINE MICROALBUMIN  11/12/2018  . FOOT EXAM  12/24/2018   Diabetes QM Metrics Latest Ref Rng & Units 11/12/2017 11/08/2017  HbA1c 4.6 - 6.5 % - 7.4(H)  Microalb 0.0 - 1.9 mg/dL 0.7 -  Micro/Creat Ratio 0.0 - 30.0 mg/g 0.8 -  Some recent data might be hidden   BP& BMI  12/24/2017 11/12/2017 10/11/2017 09/28/2017  BP 120/68 112/70 112/68 110/64  BMI 31.84 32.06 31.6 32.14  Some recent data might be hidden   Stage 2 chronic kidney disease Comments: Assessment: CKD: stage 2 - GFR 60-89 stable. Plan: glycemic control, blood pressure control, foot care, eye care, recognition and treatment of hypoglycemia, avoid NSAIDs, consult physician early if nausea, vomiting, or diarrhea and avoid IV contrast.   Skin lesion of hand Comments: Cryotherapy  Reason: skin lesion, concern for AK Location: right hand, dorsum  Liquid nitrogen was applied using the liquid nitrogen gun without difficulty with an otoscope tip for concentration. Tolerated well without complications.  Skin lesion of scalp Comments: Cryotherapy  Reason: skin lesions, concern for AK Location: x 3 on scalp  Liquid nitrogen was applied using the liquid nitrogen gun without difficulty with a tip for concentration. Tolerated well without complications.  Gastroesophageal reflux disease without esophagitis Comments: Improved somewhat with diet changes.  The patient thinks that he is  having a harder time due to a hiatal hernia.  He states his been diagnosed with this in the past.  Changed his omeprazole to Protonix.  He may continue using Zantac.  We reviewed the diagnosis of GERD and the reasons why it was important to treat this. We discussed "red flag" symptoms and the importance of follow up if symptoms persisted despite treatment. We reviewed non-pharmacologic management of GERD symptoms: including: caffeine reduction, dietary changes, elevate HOB, NPO after supper, reduction of alcohol intake, tobacco cessation, and weight loss.  Orders: -     ranitidine (ZANTAC) 150 MG capsule; Take 1 capsule (150 mg total) by mouth every evening. -     pantoprazole (PROTONIX) 40 MG tablet; Take 1 tablet (40 mg total) by mouth daily.  Morbid obesity ([place]) Comments: The patient is asked to make an attempt to improve diet and exercise patterns to aid in medical management of this problem.   Thrombocytopenia ([facility])  Systolic and diastolic CHF, chronic ([place])   . Reviewed expectations re: course of current medical issues. . Discussed self-management of symptoms. . Outlined signs and symptoms indicating need for more acute intervention. . Patient verbalized understanding and all questions were answered. Marland Kitchen Health Maintenance issues including appropriate healthy diet, exercise, and smoking avoidance were discussed with patient. . See orders for this  visit as documented in the electronic medical record. . Patient received an After Visit Summary.  [name] Deutscher, DO [place] 12/25/2017  Future Appointments  Date Time Provider [place]  02/10/2018  8:15 AM [facility]  02/14/2018  1:00 PM [name] Deutscher, DO [facility]

## 2017-12-27 ENCOUNTER — Encounter: Payer: Self-pay | Admitting: Family Medicine

## 2017-12-27 DIAGNOSIS — K219 Gastro-esophageal reflux disease without esophagitis: Secondary | ICD-10-CM

## 2017-12-27 MED ORDER — PANTOPRAZOLE SODIUM 40 MG PO TBEC: 40.0000 mg | DELAYED_RELEASE_TABLET | Freq: Every day | ORAL | 3 refills | Status: DC

## 2017-12-29 ENCOUNTER — Ambulatory Visit: Payer: Medicare Other | Admitting: Family Medicine

## 2017-12-30 ENCOUNTER — Encounter: Payer: Self-pay | Admitting: Family Medicine

## 2017-12-30 DIAGNOSIS — K219 Gastro-esophageal reflux disease without esophagitis: Secondary | ICD-10-CM

## 2017-12-30 MED ORDER — PANTOPRAZOLE SODIUM 40 MG PO TBEC: 40.0000 mg | DELAYED_RELEASE_TABLET | Freq: Every day | ORAL | 3 refills | Status: DC

## 2017-12-31 ENCOUNTER — Other Ambulatory Visit: Payer: Self-pay

## 2017-12-31 DIAGNOSIS — K219 Gastro-esophageal reflux disease without esophagitis: Secondary | ICD-10-CM

## 2017-12-31 MED ORDER — PANTOPRAZOLE SODIUM 40 MG PO TBEC: 40.0000 mg | DELAYED_RELEASE_TABLET | Freq: Every day | ORAL | 1 refills | Status: DC

## 2018-01-24 DIAGNOSIS — Z4502 Encounter for adjustment and management of automatic implantable cardiac defibrillator: Secondary | ICD-10-CM | POA: Diagnosis not present

## 2018-02-10 ENCOUNTER — Other Ambulatory Visit (INDEPENDENT_AMBULATORY_CARE_PROVIDER_SITE_OTHER): Payer: Medicare Other

## 2018-02-10 DIAGNOSIS — R739 Hyperglycemia, unspecified: Secondary | ICD-10-CM | POA: Diagnosis not present

## 2018-02-10 LAB — POCT GLYCOSYLATED HEMOGLOBIN (HGB A1C): Hemoglobin A1C: 7.3

## 2018-02-10 NOTE — Addendum Note (Signed)
Addended by: Kayren Eaves T on: 02/10/2018 08:15 AM   Modules accepted: Orders

## 2018-02-10 NOTE — Addendum Note (Signed)
Addended by: Durwin Glaze on: 02/10/2018 08:08 AM   Modules accepted: Orders

## 2018-02-13 NOTE — Assessment & Plan Note (Signed)
The patient is asked to make an attempt to improve diet and exercise patterns to aid in medical management of this problem.  

## 2018-02-13 NOTE — Progress Notes (Signed)
[name] is a 77 y.o. male is here for follow up.  History of Present Illness:   HPI:  Current symptoms: no polyuria or polydipsia, no chest pain, dyspnea or TIA's, no numbness, tingling or pain in extremities.  Taking medication compliantly without noted sided effects [x]   YES  []   NO  Episodes of hypoglycemia? []   YES  [x]   NO Maintaining a diabetic diet? [x]   YES  []   NO Trying to exercise on a regular basis? []   YES  [x]   NO   On ACE inhibitor or angiotensin II receptor blocker? [x]   YES  []   NO On Aspirin? [x]   YES  []   NO  Lab Results  Component Value Date   HGBA1C 7.3 02/10/2018    Lab Results  Component Value Date   MICROALBUR 0.7 11/12/2017    No results found for: CHOL, HDL, LDLCALC, LDLDIRECT, TRIG, CHOLHDL    Wt Readings from Last 3 Encounters:  02/14/18 239 lb (108.4 kg)  12/24/17 234 lb 12.8 oz (106.5 kg)  11/12/17 236 lb 6.4 oz (107.2 kg)   BP Readings from Last 3 Encounters:  02/14/18 110/60  12/24/17 120/68  11/12/17 112/70   Lab Results  Component Value Date   CREATININE 1.62 (H) 11/12/2017   Review of Systems  Constitutional: Negative for chills, fever, malaise/fatigue and weight loss.  Respiratory: Negative for cough, shortness of breath and wheezing.   Cardiovascular: Negative for chest pain, palpitations and leg swelling.  Gastrointestinal: Negative for abdominal pain, constipation, diarrhea, nausea and vomiting.  Genitourinary: Negative for dysuria and urgency.  Musculoskeletal: Negative for joint pain and myalgias.  Skin: Negative for rash.  Neurological: Negative for dizziness and headaches.  Psychiatric/Behavioral: Negative for depression, substance abuse and suicidal ideas. The patient is not nervous/anxious.    Health Maintenance Due  Topic Date Due  . OPHTHALMOLOGY EXAM  08/03/1951   Depression screen PHQ 2/9 09/28/2017  Decreased Interest 0  Down, Depressed, Hopeless 0  PHQ - 2 Score 0   PMHx, SurgHx, SocialHx, FamHx,  Medications, and Allergies were reviewed in the Visit Navigator and updated as appropriate.   Patient Active Problem List   Diagnosis Date Noted  . Morbid obesity ([place]) 12/25/2017  . Thrombocytopenia ([place]) 11/15/2017  . Allergic rhinitis 09/28/2017  . Cardiac defibrillator in place 09/28/2017  . Congestive heart failure ([place]) 09/28/2017  . CAD (coronary artery disease), native coronary artery 09/28/2017  . Hyperlipidemia associated with type 2 diabetes mellitus ([place]) 09/28/2017  . GERD (gastroesophageal reflux disease) 09/28/2017  . Mitral valve incompetence 09/28/2017  . 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 History of actinic keratoses 09/12/2015  . Type 2 DM with CKD stage 2 and hypertension ([place]) 01/04/2015  . Enthesopathy of hip region 01/04/2015  . DM (diabetes mellitus), type 2 ([place]) 09/04/2013  . Hypertension associated with diabetes ([place]) 09/04/2013  . 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 Abnormal nuclear stress test 08/26/2011  . Abnormal echocardiogram 08/26/2011  . Systolic and diastolic CHF, chronic ([place]) 08/26/2011   Social History   Tobacco Use  . Smoking status: Former Research scientist (life sciences)  . Smokeless tobacco: Never Used  Substance Use Topics  . Alcohol use: No    Frequency: Never  . Drug use: No   Current Medications and Allergies:   .  aspirin 325 MG tablet, Take 325 mg by mouth., Disp: , Rfl:  .  Azelastine HCl 0.15 % SOLN, Place 1 spray daily into both nostrils., Disp: 90 mL,  Rfl: 1 .  carvedilol (COREG) 25 MG tablet, TAKE 1 TABLET TWICE A DAY, Disp: , Rfl:  .  digoxin (LANOXIN) 0.125 MG tablet, Take 1 tablet (0.125 mg total) daily by mouth., Disp: 90 tablet, Rfl: 1 .  ENTRESTO 49-51 MG, , Disp: , Rfl:  .  ezetimibe-simvastatin (VYTORIN) 10-40 MG tablet, Take 1 tablet daily by mouth., Disp: 90 tablet, Rfl: 1 .  fluticasone (FLONASE) 50 MCG/ACT nasal spray, Place 1 spray 2 (two) times daily as needed into both nostrils for allergies or rhinitis.,  Disp: 48 g, Rfl: 1 .  furosemide (LASIX) 40 MG tablet, Take 1 tablet (40 mg total) 2 (two) times daily by mouth., Disp: 180 tablet, Rfl: 1 .  insulin aspart (NOVOLOG) 100 UNIT/ML FlexPen, INJECT 30 UNITS UNDER THE SKIN THREE TIMES A DAY WITH MEALS, Disp: 35 pen, Rfl: 1 .  Insulin Glargine (LANTUS SOLOSTAR) 100 UNIT/ML Solostar Pen, INJECT 68 UNITS UNDER THE SKIN DAILY, Disp: , Rfl:  .  isosorbide mononitrate (IMDUR) 30 MG 24 hr tablet, Take 30 mg by mouth., Disp: , Rfl:  .  Lancets (FREESTYLE) lancets, Use as instructed, Disp: 400 each, Rfl: 1 .  nitroGLYCERIN (NITROSTAT) 0.4 MG SL tablet, Place 0.4 mg under the tongue., Disp: , Rfl:  .  pantoprazole (PROTONIX) 40 MG tablet, Take 1 tablet (40 mg total) by mouth daily., Disp: 90 tablet, Rfl: 1 .  potassium chloride SA (KLOR-CON M20) 20 MEQ tablet, TAKE 1 TABLET DAILY WITH LASIX, Disp: 90 tablet, Rfl: 1 .  ranitidine (ZANTAC) 150 MG capsule, Take 1 capsule (150 mg total) by mouth every evening., Disp: 90 capsule, Rfl: 3  No Known Allergies   Review of Systems   Pertinent items are noted in the HPI. Otherwise, ROS is negative.  Vitals:   Vitals:   02/14/18 1303  BP: 110/60  Pulse: 62  Temp: 98.3 F (36.8 C)  SpO2: 98%  Weight: 239 lb (108.4 kg)  Height: 6' (1.829 m)     Body mass index is 32.41 kg/m.  Physical Exam:   Physical Exam  Constitutional: He is oriented to person, place, and time. He appears well-developed and well-nourished. No distress.  HENT:  Head: Normocephalic and atraumatic.  Right Ear: External ear normal.  Left Ear: External ear normal.  Nose: Nose normal.  Mouth/Throat: Oropharynx is clear and moist.  Eyes: Conjunctivae and EOM are normal. Pupils are equal, round, and reactive to light.  Neck: Normal range of motion. Neck supple.  Cardiovascular: Normal rate, regular rhythm, normal heart sounds and intact distal pulses.  Pulmonary/Chest: Effort normal and breath sounds normal.  Abdominal: Soft. Bowel  sounds are normal.  Musculoskeletal: Normal range of motion.  Neurological: He is alert and oriented to person, place, and time.  Skin: Skin is warm and dry.  Psychiatric: He has a normal mood and affect. His behavior is normal. Judgment and thought content normal.  Nursing note and vitals reviewed.   Results for orders placed or performed in visit on 02/10/18  POCT glycosylated hemoglobin (Hb A1C)  Result Value Ref Range   Hemoglobin A1C 7.3    Assessment and Plan:   Patient Active Problem List   Diagnosis Date Noted  . Morbid obesity ([place]) 12/25/2017  . Thrombocytopenia ([place]) 11/15/2017  . Allergic rhinitis 09/28/2017  . Cardiac defibrillator in place 09/28/2017  . Congestive heart failure ([place]) 09/28/2017  . CAD (coronary artery disease), native coronary artery 09/28/2017  . Hyperlipidemia associated with type 2 diabetes mellitus ([place]) 09/28/2017  .  GERD (gastroesophageal reflux disease) 09/28/2017  . Mitral valve incompetence 09/28/2017  . 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 History of actinic keratoses 09/12/2015  . Type 2 DM with CKD stage 2 and hypertension ([place]) 01/04/2015  . Enthesopathy of hip region 01/04/2015  . DM (diabetes mellitus), type 2 ([place]) 09/04/2013  . Hypertension associated with diabetes ([place]) 09/04/2013  . 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 Abnormal nuclear stress test 08/26/2011  . Abnormal echocardiogram 08/26/2011  . Systolic and diastolic CHF, chronic ([place]) 08/26/2011   1. Morbid obesity ([place])   2. Type 2 DM with CKD stage 2 and hypertension ([place])   3. Hyperlipidemia associated with type 2 diabetes mellitus ([place])   4. Type 2 diabetes mellitus with chronic kidney disease, with long-term current use of insulin, unspecified CKD stage ([place])   5. Hypertension associated with diabetes ([place])    Type 2 DM with CKD stage 2 and hypertension ([place]) Diabetic Review of Systems - medication compliance: compliant all of the time, diabetic  diet compliance: compliant most of the time, home glucose monitoring: is performed regularly.  Other symptoms and concerns: None.  Diabetes Mellitus: well controlled. See orders for this visit as documented in the electronic medical record. Issues reviewed with him: labs immediately prior to next visit.  Hyperlipidemia associated with type 2 diabetes mellitus ([place]) Tolerating Vytorin. Compliant. No concerns or side-effects, including myalgias. Will continue current treatment. Labs prior to next visit.   Morbid obesity ([place]) The patient is asked to make an attempt to improve diet and exercise patterns to aid in medical management of this problem.   . Reviewed expectations re: course of current medical issues. . Discussed self-management of symptoms. . Outlined signs and symptoms indicating need for more acute intervention. . Patient verbalized understanding and all questions were answered. Marland Kitchen Health Maintenance issues including appropriate healthy diet, exercise, and smoking avoidance were discussed with patient. . See orders for this visit as documented in the electronic medical record. . Patient received an After Visit Summary.  CMA served as Education administrator during this visit. History, Physical, and Plan performed by medical provider. The above documentation has been reviewed and is accurate and complete. [name] Deutscher, D.O.  [name] Deutscher, DO [place] 02/16/2018

## 2018-02-13 NOTE — Assessment & Plan Note (Signed)
Tolerating Vytorin. Compliant. No concerns or side-effects, including myalgias. Will continue current treatment. Labs prior to next visit.

## 2018-02-13 NOTE — Assessment & Plan Note (Signed)
Diabetic Review of Systems - medication compliance: compliant all of the time, diabetic diet compliance: compliant most of the time, home glucose monitoring: is performed regularly.  Other symptoms and concerns: None.  Diabetes Mellitus: well controlled. See orders for this visit as documented in the electronic medical record. Issues reviewed with him: labs immediately prior to next visit.

## 2018-02-14 ENCOUNTER — Encounter: Payer: Self-pay | Admitting: Family Medicine

## 2018-02-14 ENCOUNTER — Ambulatory Visit (INDEPENDENT_AMBULATORY_CARE_PROVIDER_SITE_OTHER): Payer: Medicare Other | Admitting: Family Medicine

## 2018-02-14 DIAGNOSIS — E1122 Type 2 diabetes mellitus with diabetic chronic kidney disease: Secondary | ICD-10-CM

## 2018-02-14 DIAGNOSIS — I10 Essential (primary) hypertension: Secondary | ICD-10-CM

## 2018-02-14 DIAGNOSIS — E785 Hyperlipidemia, unspecified: Secondary | ICD-10-CM

## 2018-02-14 DIAGNOSIS — E1169 Type 2 diabetes mellitus with other specified complication: Secondary | ICD-10-CM

## 2018-02-14 DIAGNOSIS — Z794 Long term (current) use of insulin: Secondary | ICD-10-CM

## 2018-02-14 DIAGNOSIS — E1159 Type 2 diabetes mellitus with other circulatory complications: Secondary | ICD-10-CM | POA: Diagnosis not present

## 2018-02-14 DIAGNOSIS — N182 Chronic kidney disease, stage 2 (mild): Secondary | ICD-10-CM

## 2018-02-14 DIAGNOSIS — I152 Hypertension secondary to endocrine disorders: Secondary | ICD-10-CM

## 2018-02-14 DIAGNOSIS — I129 Hypertensive chronic kidney disease with stage 1 through stage 4 chronic kidney disease, or unspecified chronic kidney disease: Secondary | ICD-10-CM

## 2018-02-16 ENCOUNTER — Encounter: Payer: Self-pay | Admitting: Family Medicine

## 2018-02-16 DIAGNOSIS — Z5181 Encounter for therapeutic drug level monitoring: Secondary | ICD-10-CM | POA: Diagnosis not present

## 2018-02-16 DIAGNOSIS — I10 Essential (primary) hypertension: Secondary | ICD-10-CM | POA: Diagnosis not present

## 2018-02-16 DIAGNOSIS — N182 Chronic kidney disease, stage 2 (mild): Secondary | ICD-10-CM | POA: Diagnosis not present

## 2018-02-16 DIAGNOSIS — Z79899 Other long term (current) drug therapy: Secondary | ICD-10-CM | POA: Diagnosis not present

## 2018-02-16 DIAGNOSIS — I517 Cardiomegaly: Secondary | ICD-10-CM | POA: Diagnosis not present

## 2018-02-16 DIAGNOSIS — I255 Ischemic cardiomyopathy: Secondary | ICD-10-CM | POA: Diagnosis not present

## 2018-02-16 DIAGNOSIS — I2583 Coronary atherosclerosis due to lipid rich plaque: Secondary | ICD-10-CM | POA: Diagnosis not present

## 2018-02-16 DIAGNOSIS — I519 Heart disease, unspecified: Secondary | ICD-10-CM | POA: Diagnosis not present

## 2018-02-16 DIAGNOSIS — Z950 Presence of cardiac pacemaker: Secondary | ICD-10-CM | POA: Diagnosis not present

## 2018-02-16 DIAGNOSIS — I2510 Atherosclerotic heart disease of native coronary artery without angina pectoris: Secondary | ICD-10-CM | POA: Diagnosis not present

## 2018-02-17 ENCOUNTER — Encounter: Payer: Self-pay | Admitting: *Deleted

## 2018-02-17 ENCOUNTER — Ambulatory Visit (INDEPENDENT_AMBULATORY_CARE_PROVIDER_SITE_OTHER): Payer: Medicare Other | Admitting: *Deleted

## 2018-02-17 VITALS — BP 116/56 | HR 52 | Ht 72.0 in | Wt 239.0 lb

## 2018-02-17 DIAGNOSIS — Z0000 Encounter for general adult medical examination without abnormal findings: Secondary | ICD-10-CM

## 2018-02-17 NOTE — Progress Notes (Signed)
I have personally reviewed the Medicare Annual Wellness questionnaire and have noted 1. The patient's medical and social history 2. Their use of alcohol, tobacco or illicit drugs 3. Their current medications and supplements 4. The patient's functional ability including ADL's, fall risks, home safety risks and hearing or visual impairment. 5. Diet and physical activities 6. Evidence for depression or mood disorders 7. Reviewed Updated provider list, see scanned forms and [facility] Snapshot.   The patients weight, height, BMI and visual acuity have been recorded in the chart I have made referrals, counseling and provided education to the patient based review of the above and I have provided the pt with a written personalized care plan for preventive services.  I have provided the patient with a copy of your personalized plan for preventive services. Instructed to take the time to review along with their updated medication list.  Shaunee Mulkern  

## 2018-02-17 NOTE — Progress Notes (Signed)
Subjective:   [name] is a 77 y.o. male who presents for Medicare Annual/Subsequent preventive examination.  Reports health as  Just seen cardiology yesterday, decreased digoxin  OV 02/14/2018 Obesity No family hx No surgical hx  Diet A1c is 7.3  BMI 32.4 3 meals Checks bs 5 to 6 times a day   Exercise Walking  States he does have a stationary bike and agrees to use it  Busy during the day   Former smoker - remote  AAA as he is not sure if he smoked 100 cigarettes  Discussed AAA and he will consider    Cardiac Risk Factors include: advanced age (>8men, >49 women);diabetes mellitus;dyslipidemia;family history of premature cardiovascular disease;hypertension;male gender;obesity (BMI >30kg/m2);sedentary lifestyle  There are no preventive care reminders to display for this patient.  States eye exam was in late 2018 at [facility]     Objective:    Vitals: BP (!) 116/56   Pulse (!) 52   Ht 6' (1.829 m)   Wt 239 lb (108.4 kg)   SpO2 94%   BMI 32.41 kg/m   Body mass index is 32.41 kg/m.  Advanced Directives 02/17/2018 10/20/2017  Does Patient Have a Medical Advance Directive? Yes Yes  Type of Advance Directive - Healthcare Power of Attorney    Tobacco Social History   Tobacco Use  Smoking Status Former Smoker  Smokeless Tobacco Never Used  Tobacco Comment   remote at 56;     Counseling given: Not Answered Comment: remote at 6; Educated regarding the AAA   Clinical Intake:     Past Medical History:  Diagnosis Date  . Abnormal echocardiogram 08/26/2011  . Abnormal nuclear stress test 08/26/2011  . Allergic rhinitis 09/28/2017  . 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 CAD (coronary artery disease), native coronary artery 09/28/2017  . Cardiac defibrillator in place 09/28/2017  . 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 Laurance Flatten  . Congestive heart failure ([place])  09/28/2017  . DM (diabetes mellitus), type 2 ([place]) 09/04/2013  . Enthesopathy of hip region 01/04/2015  . GERD (gastroesophageal reflux disease) 09/28/2017  . History of actinic keratoses 09/12/2015  . Hyperlipidemia 09/28/2017  . Hypertension associated with diabetes ([place]) 09/04/2013  . Mitral valve incompetence 09/28/2017  . Obesity (BMI 30-39.9) 05/12/2017  . 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 Andria Frames, Dr. Purcell Nails  . Systolic and diastolic CHF, chronic ([place]) 08/26/2011   No past surgical history on file. No family history on file. Social History   Socioeconomic History  . Marital status: Married    Spouse name: Not on file  . Number of children: Not on file  . Years of education: Not on file  . Highest education level: Not on file  Social Needs  . Financial resource strain: Not on file  . Food insecurity - worry: Not on file  . Food insecurity - inability: Not on file  . Transportation needs - medical: Not on file  . Transportation needs - non-medical: Not on file  Occupational History  . Not on file  Tobacco Use  . Smoking status: Former Research scientist (life sciences)  . Smokeless tobacco: Never Used  . Tobacco comment: remote at 20;  Substance and Sexual Activity  . Alcohol use: No    Frequency: Never  . Drug use: No  . Sexual activity: Not on file  Other Topics Concern  . Not on file  Social History Narrative  . Not on file    Outpatient Encounter Medications as of 02/17/2018  Medication Sig  . aspirin 325 MG tablet Take 325 mg by mouth.  . Azelastine HCl 0.15 % SOLN Place 1 spray daily into both nostrils.  . carvedilol (COREG) 25 MG tablet TAKE 1 TABLET TWICE A DAY  . digoxin (LANOXIN) 0.125 MG tablet Take 1 tablet (0.125 mg total) daily by mouth.  . ENTRESTO 49-51 MG   . ezetimibe-simvastatin (VYTORIN) 10-40 MG tablet Take 1 tablet daily by mouth.  . fluticasone (FLONASE) 50 MCG/ACT nasal spray Place 1 spray 2 (two) times daily as needed into both nostrils  for allergies or rhinitis.  . furosemide (LASIX) 40 MG tablet Take 1 tablet (40 mg total) 2 (two) times daily by mouth.  . insulin aspart (NOVOLOG) 100 UNIT/ML FlexPen INJECT 30 UNITS UNDER THE SKIN THREE TIMES A DAY WITH MEALS  . Insulin Glargine (LANTUS SOLOSTAR) 100 UNIT/ML Solostar Pen INJECT 68 UNITS UNDER THE SKIN DAILY  . isosorbide mononitrate (IMDUR) 30 MG 24 hr tablet Take 30 mg by mouth.  . Lancets (FREESTYLE) lancets Use as instructed  . nitroGLYCERIN (NITROSTAT) 0.4 MG SL tablet Place 0.4 mg under the tongue.  . pantoprazole (PROTONIX) 40 MG tablet Take 1 tablet (40 mg total) by mouth daily.  . potassium chloride SA (KLOR-CON M20) 20 MEQ tablet TAKE 1 TABLET DAILY WITH LASIX  . ranitidine (ZANTAC) 150 MG capsule Take 1 capsule (150 mg total) by mouth every evening.   No facility-administered encounter medications on file as of 02/17/2018.     Activities of Daily Living In your present state of health, do you have any difficulty performing the following activities: 02/17/2018  Hearing? N  Vision? N  Difficulty concentrating or making decisions? N  Walking or climbing stairs? (No Data)  Comment again can do waht he wants, just has to take breaks  Dressing or bathing? N  Doing errands, shopping? N  Preparing Food and eating ? N  Using the Toilet? N  Managing your Medications? N  Managing your Finances? N  Housekeeping or managing your Housekeeping? N  Some recent data might be hidden    Patient Care Team: [name] Deutscher, DO as PCP - General (Family Medicine)   Assessment:   This is a routine wellness examination for [name].  Exercise Activities and Dietary recommendations Current Exercise Habits: Home exercise routine, Time (Minutes): 30, Frequency (Times/Week): 3, Weekly Exercise (Minutes/Week): 90, Intensity: Mild  Goals    . Exercise 150 min/wk Moderate Activity     Will try to walk and bike consistently 3 days a week        Fall Risk Fall Risk  02/17/2018  09/28/2017  Falls in the past year? No No   No  can do what he wants just can't do it as long States he can regenerate in a few minutes  Depression Screen PHQ 2/9 Scores 02/17/2018 09/28/2017  PHQ - 2 Score 0 0    Cognitive Function MMSE - Mini Mental State Exam 02/17/2018  Not completed: (No Data)   Ad8 score reviewed for issues:  Issues making decisions:  Less interest in hobbies / activities:  Repeats questions, stories (family complaining):  Trouble using ordinary gadgets (microwave, computer, phone):  Forgets the month or year:   Mismanaging finances:   Remembering appts:  Daily problems with thinking and/or memory: Ad8 score is=0          Immunization History  Administered Date(s)  Administered  . Influenza Split 10/11/2007, 08/14/2010, 09/08/2011  . Influenza, High Dose Seasonal PF 10/11/2007, 08/14/2010, 09/08/2011, 10/24/2015, 11/06/2016, 09/28/2017  . Influenza, Seasonal, Injecte, Preservative Fre 12/18/2013  . Influenza,inj,Quad PF,6+ Mos 12/18/2013  . Pneumococcal Conjugate-13 04/26/2015  . Pneumococcal Polysaccharide-23 04/06/2012  . Td 04/22/2006, 04/22/2006  . Tdap 04/26/2015  . Zoster 05/27/2015     Screening Tests Health Maintenance  Topic Date Due  . HEMOGLOBIN A1C  08/13/2018  . OPHTHALMOLOGY EXAM  10/07/2018  . URINE MICROALBUMIN  11/12/2018  . FOOT EXAM  12/24/2018  . TETANUS/TDAP  04/25/2025  . INFLUENZA VACCINE  Completed  . PNA vac Low Risk Adult  Completed    Plan:      PCP Notes   Health Maintenance Educated regarding the shingrix Just had zoster 05/2015 Educated to take anytime, 2 vaccines within 6 months and can get these through [place]   Educated regarding AAA; remote smoking, will think about it Copied the guideline for him Just had echocardiogrma but has not gotten the result.   Eye exam in the fall of 2018; Manuela Schwartz called [facility] to confirm date of eye exam and request fax. Will fup; had to leave a message     Abnormal Screens  Cardiologist managing digoxin per the patient.  Referrals  none  Patient concerns; Manages his insulin by his own formula, backed up by checking BS x 5 per day  Checks his PP BS and targets range to be between 140 and 180.   Also inc'd his insulin (novolog to 40 units ac) Lantus is at 77 units instead of 68   Discussed his desire for the [place]; Located article which Medicare covers. Will check on Medicare NCD or LCD for coverage information and get back to him.   Nurse Concerns; As noted  No c/o and doing well at present Did encourage exercise; walking intentionally or on his stationary bike at home to prevent loss of tone and falls in the future.   Next PCP apt 05/17/2018      I have personally reviewed and noted the following in the patient's chart:   . Medical and social history . Use of alcohol, tobacco or illicit drugs  . Current medications and supplements . Functional ability and status . Nutritional status . Physical activity . Advanced directives . List of other physicians . Hospitalizations, surgeries, and ER visits in previous 12 months . Vitals . Screenings to include cognitive, depression, and falls . Referrals and appointments  In addition, I have reviewed and discussed with patient certain preventive protocols, quality metrics, and best practice recommendations. A written personalized care plan for preventive services as well as general preventive health recommendations were provided to patient.     Wynetta Fines, RN  02/17/2018

## 2018-02-17 NOTE — Patient Instructions (Addendum)
Mr. [name] , Thank you for taking time to come for your Medicare Wellness Visit. I appreciate your ongoing commitment to your health goals. Please review the following plan we discussed and let me know if I can assist you in the future.   Manuela Schwartz to fup with [facility] for your eye report   Shingrix is a vaccine for the prevention of Shingles in Adults 77 and older.  If you are on Medicare, you can request a prescription from your doctor to be filled at a pharmacy.  Please check with your benefits regarding applicable copays or out of pocket expenses.  The Shingrix is given in 2 vaccines approx 8 weeks apart. You must receive the 2nd dose prior to 6 months from receipt of the first.  Will check with the [place] may pay for a hearing aid; Will [facility]    These are the goals we discussed: Goals    . Exercise 150 min/wk Moderate Activity     Will try to walk and bike consistently 3 days a week        This is a list of the screening recommended for you and due dates:  Health Maintenance  Topic Date Due  . Eye exam for diabetics  08/03/1951  . Hemoglobin A1C  08/13/2018  . Urine Protein Check  11/12/2018  . Complete foot exam   12/24/2018  . Tetanus Vaccine  04/25/2025  . Flu Shot  Completed  . Pneumonia vaccines  Completed      Fall Prevention in the Home Falls can cause injuries. They can happen to people of all ages. There are many things you can do to make your home safe and to help prevent falls. What can I do on the outside of my home?  Regularly fix the edges of walkways and driveways and fix any cracks.  Remove anything that might make you trip as you walk through a door, such as a raised step or threshold.  Trim any bushes or trees on the path to your home.  Use bright outdoor lighting.  Clear any walking paths of anything that might make someone trip, such as rocks or tools.  Regularly check to see if handrails are loose  or broken. Make sure that both sides of any steps have handrails.  Any raised decks and porches should have guardrails on the edges.  Have any leaves, snow, or ice cleared regularly.  Use sand or salt on walking paths during winter.  Clean up any spills in your garage right away. This includes oil or grease spills. What can I do in the bathroom?  Use night lights.  Install grab bars by the toilet and in the tub and shower. Do not use towel bars as grab bars.  Use non-skid mats or decals in the tub or shower.  If you need to sit down in the shower, use a plastic, non-slip stool.  Keep the floor dry. Clean up any water that spills on the floor as soon as it happens.  Remove soap buildup in the tub or shower regularly.  Attach bath mats securely with double-sided non-slip rug tape.  Do not have throw rugs and other things on the floor that can make you trip. What can I do in the bedroom?  Use night lights.  Make sure that you have a light by your bed that is easy to reach.  Do not use any sheets or blankets that are too  big for your bed. They should not hang down onto the floor.  Have a firm chair that has side arms. You can use this for support while you get dressed.  Do not have throw rugs and other things on the floor that can make you trip. What can I do in the kitchen?  Clean up any spills right away.  Avoid walking on wet floors.  Keep items that you use a lot in easy-to-reach places.  If you need to reach something above you, use a strong step stool that has a grab bar.  Keep electrical cords out of the way.  Do not use floor polish or wax that makes floors slippery. If you must use wax, use non-skid floor wax.  Do not have throw rugs and other things on the floor that can make you trip. What can I do with my stairs?  Do not leave any items on the stairs.  Make sure that there are handrails on both sides of the stairs and use them. Fix handrails that are  broken or loose. Make sure that handrails are as long as the stairways.  Check any carpeting to make sure that it is firmly attached to the stairs. Fix any carpet that is loose or worn.  Avoid having throw rugs at the top or bottom of the stairs. If you do have throw rugs, attach them to the floor with carpet tape.  Make sure that you have a light switch at the top of the stairs and the bottom of the stairs. If you do not have them, ask someone to add them for you. What else can I do to help prevent falls?  Wear shoes that: ? Do not have high heels. ? Have rubber bottoms. ? Are comfortable and fit you well. ? Are closed at the toe. Do not wear sandals.  If you use a stepladder: ? Make sure that it is fully opened. Do not climb a closed stepladder. ? Make sure that both sides of the stepladder are locked into place. ? Ask someone to hold it for you, if possible.  Clearly mark and make sure that you can see: ? Any grab bars or handrails. ? First and last steps. ? Where the edge of each step is.  Use tools that help you move around (mobility aids) if they are needed. These include: ? Canes. ? Walkers. ? Scooters. ? Crutches.  Turn on the lights when you go into a dark area. Replace any light bulbs as soon as they burn out.  Set up your furniture so you have a clear path. Avoid moving your furniture around.  If any of your floors are uneven, fix them.  If there are any pets around you, be aware of where they are.  Review your medicines with your doctor. Some medicines can make you feel dizzy. This can increase your chance of falling. Ask your doctor what other things that you can do to help prevent falls. This information is not intended to replace advice given to you by your health care provider. Make sure you discuss any questions you have with your health care provider. Document Released: 09/19/2009 Document Revised: 04/30/2016 Document Reviewed: 12/28/2014 Elsevier  Interactive Patient Education  2018 [place] Maintenance, Male A healthy lifestyle and preventive care is important for your health and wellness. Ask your health care provider about what schedule of regular examinations is right for you. What should I know about weight and diet? Eat  a Healthy Diet  Eat plenty of vegetables, fruits, whole grains, low-fat dairy products, and lean protein.  Do not eat a lot of foods high in solid fats, added sugars, or salt.  Maintain a Healthy Weight Regular exercise can help you achieve or maintain a healthy weight. You should:  Do at least 150 minutes of exercise each week. The exercise should increase your heart rate and make you sweat (moderate-intensity exercise).  Do strength-training exercises at least twice a week.  Watch Your Levels of Cholesterol and Blood Lipids  Have your blood tested for lipids and cholesterol every 5 years starting at 77 years of age. If you are at high risk for heart disease, you should start having your blood tested when you are 77 years old. You may need to have your cholesterol levels checked more often if: ? Your lipid or cholesterol levels are high. ? You are older than 77 years of age. ? You are at high risk for heart disease.  What should I know about cancer screening? Many types of cancers can be detected early and may often be prevented. Lung Cancer  You should be screened every year for lung cancer if: ? You are a current smoker who has smoked for at least 30 years. ? You are a former smoker who has quit within the past 15 years.  Talk to your health care provider about your screening options, when you should start screening, and how often you should be screened.  Colorectal Cancer  Routine colorectal cancer screening usually begins at 77 years of age and should be repeated every 5-10 years until you are 77 years old. You may need to be screened more often if early forms of precancerous  polyps or small growths are found. Your health care provider may recommend screening at an earlier age if you have risk factors for colon cancer.  Your health care provider may recommend using home test kits to check for hidden blood in the stool.  A small camera at the end of a tube can be used to examine your colon (sigmoidoscopy or colonoscopy). This checks for the earliest forms of colorectal cancer.  Prostate and Testicular Cancer  Depending on your age and overall health, your health care provider may do certain tests to screen for prostate and testicular cancer.  Talk to your health care provider about any symptoms or concerns you have about testicular or prostate cancer.  Skin Cancer  Check your skin from head to toe regularly.  Tell your health care provider about any new moles or changes in moles, especially if: ? There is a change in a mole's size, shape, or color. ? You have a mole that is larger than a pencil eraser.  Always use sunscreen. Apply sunscreen liberally and repeat throughout the day.  Protect yourself by wearing long sleeves, pants, a wide-brimmed hat, and sunglasses when outside.  What should I know about heart disease, diabetes, and high blood pressure?  If you are 12-74 years of age, have your blood pressure checked every 3-5 years. If you are 42 years of age or older, have your blood pressure checked every year. You should have your blood pressure measured twice-once when you are at a hospital or clinic, and once when you are not at a hospital or clinic. Record the average of the two measurements. To check your blood pressure when you are not at a hospital or clinic, you can use: ? An automated blood pressure machine at a  pharmacy. ? A home blood pressure monitor.  Talk to your health care provider about your target blood pressure.  If you are between 92-60 years old, ask your health care provider if you should take aspirin to prevent heart  disease.  Have regular diabetes screenings by checking your fasting blood sugar level. ? If you are at a normal weight and have a low risk for diabetes, have this test once every three years after the age of 24. ? If you are overweight and have a high risk for diabetes, consider being tested at a younger age or more often.  A one-time screening for abdominal aortic aneurysm (AAA) by ultrasound is recommended for men aged 20-75 years who are current or former smokers. What should I know about preventing infection? Hepatitis B If you have a higher risk for hepatitis B, you should be screened for this virus. Talk with your health care provider to find out if you are at risk for hepatitis B infection. Hepatitis C Blood testing is recommended for:  Everyone born from [birth year] through 1965.  Anyone with known risk factors for hepatitis C.  Sexually Transmitted Diseases (STDs)  You should be screened each year for STDs including gonorrhea and chlamydia if: ? You are sexually active and are younger than 77 years of age. ? You are older than 77 years of age and your health care provider tells you that you are at risk for this type of infection. ? Your sexual activity has changed since you were last screened and you are at an increased risk for chlamydia or gonorrhea. Ask your health care provider if you are at risk.  Talk with your health care provider about whether you are at high risk of being infected with HIV. Your health care provider may recommend a prescription medicine to help prevent HIV infection.  What else can I do?  Schedule regular health, dental, and eye exams.  Stay current with your vaccines (immunizations).  Do not use any tobacco products, such as cigarettes, chewing tobacco, and e-cigarettes. If you need help quitting, ask your health care provider.  Limit alcohol intake to no more than 2 drinks per day. One drink equals 12 ounces of beer, 5 ounces of wine, or 1 ounces of  hard liquor.  Do not use street drugs.  Do not share needles.  Ask your health care provider for help if you need support or information about quitting drugs.  Tell your health care provider if you often feel depressed.  Tell your health care provider if you have ever been abused or do not feel safe at home. This information is not intended to replace advice given to you by your health care provider. Make sure you discuss any questions you have with your health care provider. Document Released: 05/21/2008 Document Revised: 07/22/2016 Document Reviewed: 08/27/2015 Elsevier Interactive Patient Education  2018 [employer].   Hearing Loss Hearing loss is a partial or total loss of the ability to hear. This can be temporary or permanent, and it can happen in one or both ears. Hearing loss may be referred to as deafness. Medical care is necessary to treat hearing loss properly and to prevent the condition from getting worse. Your hearing may partially or completely come back, depending on what caused your hearing loss and how severe it is. In some cases, hearing loss is permanent. What are the causes? Common causes of hearing loss include:  Too much wax in the ear canal.  Infection of  the ear canal or middle ear.  Fluid in the middle ear.  Injury to the ear or surrounding area.  An object stuck in the ear.  Prolonged exposure to loud sounds, such as music.  Less common causes of hearing loss include:  Tumors in the ear.  Viral or bacterial infections, such as meningitis.  A hole in the eardrum (perforated eardrum).  Problems with the hearing nerve that sends signals between the brain and the ear.  Certain medicines.  What are the signs or symptoms? Symptoms of this condition may include:  Difficulty telling the difference between sounds.  Difficulty following a conversation when there is background noise.  Lack of response to sounds in your environment. This may be  most noticeable when you do not respond to startling sounds.  Needing to turn up the volume on the television, radio, etc.  Ringing in the ears.  Dizziness.  Pain in the ears.  How is this diagnosed? This condition is diagnosed based on a physical exam and a hearing test (audiometry). The audiometry test will be performed by a hearing specialist (audiologist). You may also be referred to an ear, nose, and throat (ENT) specialist (otolaryngologist). How is this treated? Treatment for recent onset of hearing loss may include:  Ear wax removal.  Being prescribed medicines to prevent infection (antibiotics).  Being prescribed medicines to reduce inflammation (corticosteroids).  Follow these instructions at home:  If you were prescribed an antibiotic medicine, take it as told by your health care provider. Do not stop taking the antibiotic even if you start to feel better.  Take over-the-counter and prescription medicines only as told by your health care provider.  Avoid loud noises.  Return to your normal activities as told by your health care provider. Ask your health care provider what activities are safe for you.  Keep all follow-up visits as told by your health care provider. This is important. Contact a health care provider if:  You feel dizzy.  You develop new symptoms.  You vomit or feel nauseous.  You have a fever. Get help right away if:  You develop sudden changes in your vision.  You have severe ear pain.  You have new or increased weakness.  You have a severe headache. This information is not intended to replace advice given to you by your health care provider. Make sure you discuss any questions you have with your health care provider. Document Released: 11/23/2005 Document Revised: 04/30/2016 Document Reviewed: 04/10/2015 Elsevier Interactive Patient Education  2018 [employer].

## 2018-02-18 ENCOUNTER — Telehealth: Payer: Self-pay

## 2018-02-18 NOTE — Telephone Encounter (Signed)
Call to [name] I was unable to find medicare statement (MLS) regarding [employer] Free stlyle libre system. But [employer] has stated medicare has agreed to cover under the following conditions. 1. The Beneficiary has diabetes 2. The beneficiary has been using the BGM and performing finger sticks 4 or more times per day 3. The beneficiary is insulin -treated with mulitple (3 or more) daily injections of insulin or a continuous pump 4. The beneficiary's insulin treatment regimen requires frequent adjustment by the beneficiary on the basis of BGM or CGM testing 5. Within 6 months prior to ordering the CGM, the treating practitioner has an in-person visit with the beneficiary to evaluate their diabetes control and determine that criteria 1-4 are met.  There is physician fup q 6 months.  Noted that the [place] system may be covered under part B instead of Part D, under the durable medicare equipment benefit.    [name] is going to check with the [place] and [place]   Also will check to see if the [place] did an AAA due to remote smoking history

## 2018-02-25 NOTE — Telephone Encounter (Signed)
Mr. Hughlett called to let me know that [employer] referred him to [facility] Colt will outreach Dr. 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  [facility] to outreach Dr. Juleen China.  Just fYI sh

## 2018-03-14 NOTE — Telephone Encounter (Signed)
° °  [employer] called about a fax they sent twice for patients order and they are needing patients notes from his last visit regarding his diabetes.  Pt. Account # [id number]   Sheena [phone] ext [phone] Fax # [phone]

## 2018-03-15 NOTE — Telephone Encounter (Signed)
Form faxed called and let them know sending.

## 2018-03-31 ENCOUNTER — Other Ambulatory Visit: Payer: Self-pay | Admitting: Family Medicine

## 2018-04-07 ENCOUNTER — Encounter: Payer: Self-pay | Admitting: Family Medicine

## 2018-04-07 DIAGNOSIS — E1165 Type 2 diabetes mellitus with hyperglycemia: Secondary | ICD-10-CM

## 2018-04-08 ENCOUNTER — Other Ambulatory Visit: Payer: Self-pay | Admitting: Family Medicine

## 2018-04-08 DIAGNOSIS — B351 Tinea unguium: Secondary | ICD-10-CM | POA: Diagnosis not present

## 2018-04-08 DIAGNOSIS — Z872 Personal history of diseases of the skin and subcutaneous tissue: Secondary | ICD-10-CM | POA: Diagnosis not present

## 2018-04-08 DIAGNOSIS — B353 Tinea pedis: Secondary | ICD-10-CM | POA: Diagnosis not present

## 2018-04-08 DIAGNOSIS — L570 Actinic keratosis: Secondary | ICD-10-CM | POA: Diagnosis not present

## 2018-04-08 DIAGNOSIS — D2371 Other benign neoplasm of skin of right lower limb, including hip: Secondary | ICD-10-CM | POA: Diagnosis not present

## 2018-04-11 ENCOUNTER — Other Ambulatory Visit: Payer: Self-pay

## 2018-04-11 MED ORDER — FREESTYLE FREEDOM LITE W/DEVICE KIT: PACK | 0 refills | Status: DC

## 2018-04-13 ENCOUNTER — Other Ambulatory Visit: Payer: Self-pay

## 2018-04-13 MED ORDER — ISOSORBIDE MONONITRATE ER 30 MG PO TB24: 30.0000 mg | ORAL_TABLET | Freq: Every day | ORAL | 0 refills | Status: DC

## 2018-04-17 ENCOUNTER — Other Ambulatory Visit: Payer: Self-pay | Admitting: Family Medicine

## 2018-04-28 ENCOUNTER — Other Ambulatory Visit: Payer: Self-pay | Admitting: Family Medicine

## 2018-04-28 DIAGNOSIS — I255 Ischemic cardiomyopathy: Secondary | ICD-10-CM | POA: Diagnosis not present

## 2018-04-28 DIAGNOSIS — Z95810 Presence of automatic (implantable) cardiac defibrillator: Secondary | ICD-10-CM | POA: Diagnosis not present

## 2018-04-28 DIAGNOSIS — Z4502 Encounter for adjustment and management of automatic implantable cardiac defibrillator: Secondary | ICD-10-CM | POA: Diagnosis not present

## 2018-04-29 ENCOUNTER — Ambulatory Visit (INDEPENDENT_AMBULATORY_CARE_PROVIDER_SITE_OTHER): Payer: Medicare Other | Admitting: Family Medicine

## 2018-04-29 ENCOUNTER — Encounter: Payer: Self-pay | Admitting: Family Medicine

## 2018-04-29 DIAGNOSIS — W57XXXA Bitten or stung by nonvenomous insect and other nonvenomous arthropods, initial encounter: Secondary | ICD-10-CM | POA: Diagnosis not present

## 2018-04-29 DIAGNOSIS — S40869A Insect bite (nonvenomous) of unspecified upper arm, initial encounter: Secondary | ICD-10-CM | POA: Diagnosis not present

## 2018-04-29 DIAGNOSIS — L989 Disorder of the skin and subcutaneous tissue, unspecified: Secondary | ICD-10-CM | POA: Diagnosis not present

## 2018-04-29 MED ORDER — DOXYCYCLINE HYCLATE 100 MG PO TABS: 100.0000 mg | ORAL_TABLET | Freq: Two times a day (BID) | ORAL | 0 refills | Status: DC

## 2018-04-29 NOTE — Progress Notes (Signed)
[name] is a 77 y.o. male is here for follow up.  History of Present Illness:   HPI: Tick bite to right axilla. Noted within one day, he thinks. Pulled off himself. Area red and irritated. No systemic symptoms. Diabetic, controlled.   There are no preventive care reminders to display for this patient. Depression screen [facility] 2/9 02/17/2018 09/28/2017  Decreased Interest 0 0  Down, Depressed, Hopeless 0 0  PHQ - 2 Score 0 0   PMHx, SurgHx, SocialHx, FamHx, Medications, and Allergies were reviewed in the Visit Navigator and updated as appropriate.   Patient Active Problem List   Diagnosis Date Noted  . Morbid obesity ([place]) 12/25/2017  . Thrombocytopenia ([place]) 11/15/2017  . Allergic rhinitis 09/28/2017  . Cardiac defibrillator in place 09/28/2017  . Congestive heart failure ([place]) 09/28/2017  . CAD (coronary artery disease), native coronary artery 09/28/2017  . Hyperlipidemia associated with type 2 diabetes mellitus ([place]) 09/28/2017  . GERD (gastroesophageal reflux disease) 09/28/2017  . Mitral valve incompetence 09/28/2017  . 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 History of actinic keratoses 09/12/2015  . 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 Enthesopathy of hip region 01/04/2015  . DM (diabetes mellitus), type 2 ([place]) 09/04/2013  . Hypertension associated with diabetes ([place]) 09/04/2013  . 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 Abnormal nuclear stress test 08/26/2011  . Abnormal echocardiogram 08/26/2011  . Systolic and diastolic CHF, chronic ([place]) 08/26/2011   Social History   Tobacco Use  . Smoking status: Former Research scientist (life sciences)  . Smokeless tobacco: Never Used  . Tobacco comment: remote at 20;  Substance Use Topics  . Alcohol use: No    Frequency: Never  . Drug use: No   Current Medications and Allergies:   Current Outpatient Medications:  .  aspirin 325 MG tablet, Take 325 mg by mouth., Disp: , Rfl:  .  Azelastine HCl 0.15 %  SOLN, Place 1 spray daily into both nostrils., Disp: 90 mL, Rfl: 1 .  Blood Glucose Monitoring Suppl (FREESTYLE FREEDOM LITE) w/Device KIT, USE TO CHECK BLOOD SUGAR AS DIRECTED, Disp: 1 each, Rfl: 0 .  carvedilol (COREG) 25 MG tablet, TAKE 1 TABLET TWICE A DAY, Disp: , Rfl:  .  digoxin (LANOXIN) 0.125 MG tablet, TAKE 1 TABLET DAILY, Disp: 90 tablet, Rfl: 1 .  ENTRESTO 49-51 MG, , Disp: , Rfl:  .  ezetimibe-simvastatin (VYTORIN) 10-40 MG tablet, TAKE 1 TABLET DAILY, Disp: 90 tablet, Rfl: 1 .  fluticasone (FLONASE) 50 MCG/ACT nasal spray, Place 1 spray 2 (two) times daily as needed into both nostrils for allergies or rhinitis., Disp: 48 g, Rfl: 1 .  furosemide (LASIX) 40 MG tablet, TAKE 1 TABLET TWICE A DAY, Disp: 180 tablet, Rfl: 1 .  insulin aspart (NOVOLOG) 100 UNIT/ML FlexPen, INJECT 30 UNITS UNDER THE SKIN THREE TIMES A DAY WITH MEALS (Patient taking differently: INJECT 30 UNITS UNDER THE SKIN THREE TIMES A DAY WITH MEALS), Disp: 35 pen, Rfl: 1 .  Insulin Glargine (LANTUS SOLOSTAR) 100 UNIT/ML Solostar Pen, INJECT 68 UNITS UNDER THE SKIN DAILY, Disp: , Rfl:  .  isosorbide mononitrate (IMDUR) 30 MG 24 hr tablet, Take 1 tablet (30 mg total) by mouth daily., Disp: 90 tablet, Rfl: 0 .  Lancets (FREESTYLE) lancets, USE AS INSTRUCTED, Disp: 400 each, Rfl: 1 .  nitroGLYCERIN (NITROSTAT) 0.4 MG SL tablet, Place 0.4 mg under the tongue., Disp: , Rfl:  .  pantoprazole (PROTONIX) 40 MG tablet,  Take 1 tablet (40 mg total) by mouth daily., Disp: 90 tablet, Rfl: 1 .  potassium chloride SA (KLOR-CON M20) 20 MEQ tablet, TAKE 1 TABLET DAILY WITH LASIX, Disp: 90 tablet, Rfl: 1 .  ranitidine (ZANTAC) 150 MG capsule, Take 1 capsule (150 mg total) by mouth every evening., Disp: 90 capsule, Rfl: 3  No Known Allergies   Review of Systems   Pertinent items are noted in the HPI. Otherwise, ROS is negative.  Vitals:   Vitals:   04/29/18 1150  BP: 120/82  Pulse: 63  Temp: 98 F (36.7 C)  TempSrc: Oral  SpO2:  94%  Weight: 237 lb 12.8 oz (107.9 kg)  Height: 6' (1.829 m)     Body mass index is 32.25 kg/m.  Physical Exam:   Physical Exam  Constitutional: He is oriented to person, place, and time. He appears well-developed and well-nourished. No distress.  HENT:  Head: Normocephalic and atraumatic.  Right Ear: External ear normal.  Left Ear: External ear normal.  Nose: Nose normal.  Mouth/Throat: Oropharynx is clear and moist.  Eyes: Pupils are equal, round, and reactive to light. Conjunctivae and EOM are normal.  Neck: Normal range of motion. Neck supple.  Cardiovascular: Normal rate, regular rhythm, normal heart sounds and intact distal pulses.  Pulmonary/Chest: Effort normal and breath sounds normal.  Abdominal: Soft. Bowel sounds are normal.  Musculoskeletal: Normal range of motion.  Neurological: He is alert and oriented to person, place, and time.  Skin: Skin is warm and dry.  Axilla with healing tick bite, scab. No other findings.   Psychiatric: He has a normal mood and affect. His behavior is normal. Judgment and thought content normal.  Nursing note and vitals reviewed.  Results for orders placed or performed in visit on 02/22/18  HM DIABETES EYE EXAM  Result Value Ref Range   HM Diabetic Eye Exam No Retinopathy No Retinopathy    Assessment and Plan:   Kenney was seen today for tick removal.  Diagnoses and all orders for this visit:  Tick bite of axillary region, initial encounter  Skin lesion -     doxycycline (VIBRA-TABS) 100 MG tablet; Take 1 tablet (100 mg total) by mouth 2 (two) times daily.   . Reviewed expectations re: course of current medical issues. . Discussed self-management of symptoms. . Outlined signs and symptoms indicating need for more acute intervention. . Patient verbalized understanding and all questions were answered. Marland Kitchen Health Maintenance issues including appropriate healthy diet, exercise, and smoking avoidance were discussed with  patient. . See orders for this visit as documented in the electronic medical record. . Patient received an After Visit Summary.  [name] Deutscher, DO [facility] 04/30/2018  Future Appointments  Date Time Provider [place]  05/16/2018  8:45 AM [facility]  05/20/2018 11:00 AM [name] Deutscher, DO [facility]

## 2018-04-30 ENCOUNTER — Encounter: Payer: Self-pay | Admitting: Family Medicine

## 2018-05-04 DIAGNOSIS — N183 Chronic kidney disease, stage 3 (moderate): Secondary | ICD-10-CM | POA: Diagnosis not present

## 2018-05-11 ENCOUNTER — Other Ambulatory Visit: Payer: Self-pay

## 2018-05-11 MED ORDER — GLUCOSE BLOOD VI STRP: ORAL_STRIP | 3 refills | Status: DC

## 2018-05-13 ENCOUNTER — Telehealth: Payer: Self-pay

## 2018-05-13 DIAGNOSIS — I130 Hypertensive heart and chronic kidney disease with heart failure and stage 1 through stage 4 chronic kidney disease, or unspecified chronic kidney disease: Secondary | ICD-10-CM | POA: Diagnosis not present

## 2018-05-13 DIAGNOSIS — Z87891 Personal history of nicotine dependence: Secondary | ICD-10-CM | POA: Diagnosis not present

## 2018-05-13 DIAGNOSIS — E785 Hyperlipidemia, unspecified: Secondary | ICD-10-CM | POA: Diagnosis not present

## 2018-05-13 DIAGNOSIS — I5042 Chronic combined systolic (congestive) and diastolic (congestive) heart failure: Secondary | ICD-10-CM | POA: Diagnosis not present

## 2018-05-13 DIAGNOSIS — E1122 Type 2 diabetes mellitus with diabetic chronic kidney disease: Secondary | ICD-10-CM | POA: Diagnosis not present

## 2018-05-13 DIAGNOSIS — N189 Chronic kidney disease, unspecified: Secondary | ICD-10-CM | POA: Diagnosis not present

## 2018-05-13 DIAGNOSIS — I255 Ischemic cardiomyopathy: Secondary | ICD-10-CM | POA: Diagnosis not present

## 2018-05-13 DIAGNOSIS — Z4502 Encounter for adjustment and management of automatic implantable cardiac defibrillator: Secondary | ICD-10-CM | POA: Diagnosis not present

## 2018-05-13 DIAGNOSIS — N182 Chronic kidney disease, stage 2 (mild): Secondary | ICD-10-CM

## 2018-05-13 DIAGNOSIS — I252 Old myocardial infarction: Secondary | ICD-10-CM | POA: Diagnosis not present

## 2018-05-13 DIAGNOSIS — E1169 Type 2 diabetes mellitus with other specified complication: Secondary | ICD-10-CM

## 2018-05-13 DIAGNOSIS — K219 Gastro-esophageal reflux disease without esophagitis: Secondary | ICD-10-CM | POA: Diagnosis not present

## 2018-05-13 DIAGNOSIS — E1165 Type 2 diabetes mellitus with hyperglycemia: Secondary | ICD-10-CM

## 2018-05-13 DIAGNOSIS — I442 Atrioventricular block, complete: Secondary | ICD-10-CM | POA: Diagnosis not present

## 2018-05-13 DIAGNOSIS — I469 Cardiac arrest, cause unspecified: Secondary | ICD-10-CM | POA: Diagnosis not present

## 2018-05-13 NOTE — Telephone Encounter (Signed)
Pt coming for labs 05/16/18. Please place future orders. Thank you.

## 2018-05-13 NOTE — Telephone Encounter (Signed)
CMP, FLP, A1C.

## 2018-05-13 NOTE — Telephone Encounter (Signed)
What labs do you want for this patient.

## 2018-05-13 NOTE — Telephone Encounter (Signed)
Labs ordered.

## 2018-05-16 ENCOUNTER — Other Ambulatory Visit (INDEPENDENT_AMBULATORY_CARE_PROVIDER_SITE_OTHER): Payer: Medicare Other

## 2018-05-16 DIAGNOSIS — E1169 Type 2 diabetes mellitus with other specified complication: Secondary | ICD-10-CM

## 2018-05-16 DIAGNOSIS — E785 Hyperlipidemia, unspecified: Secondary | ICD-10-CM

## 2018-05-16 DIAGNOSIS — E1165 Type 2 diabetes mellitus with hyperglycemia: Secondary | ICD-10-CM | POA: Diagnosis not present

## 2018-05-16 DIAGNOSIS — N182 Chronic kidney disease, stage 2 (mild): Secondary | ICD-10-CM

## 2018-05-16 LAB — LIPID PANEL
Cholesterol: 114 mg/dL (ref 0–200)
HDL: 32.9 mg/dL — ABNORMAL LOW (ref >39.00)
LDL Cholesterol: 48 mg/dL (ref 0–99)
NonHDL: 80.95
Total CHOL/HDL Ratio: 3
Triglycerides: 167 mg/dL — ABNORMAL HIGH (ref 0.0–149.0)
VLDL: 33.4 mg/dL (ref 0.0–40.0)

## 2018-05-16 LAB — HEMOGLOBIN A1C: Hgb A1c MFr Bld: 7.7 % — ABNORMAL HIGH (ref 4.6–6.5)

## 2018-05-16 NOTE — Addendum Note (Signed)
Addended by: Frutoso Chase A on: 05/16/2018 09:16 AM   Modules accepted: Orders

## 2018-05-17 ENCOUNTER — Ambulatory Visit: Payer: Medicare Other | Admitting: Family Medicine

## 2018-05-19 NOTE — Progress Notes (Signed)
[name] is a 77 y.o. male is here for follow up.  History of Present Illness:   HPI:   1. 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   Current symptoms: no polyuria or polydipsia, no chest pain, dyspnea or TIA's, no numbness, tingling or pain in extremities.   Lab Results  Component Value Date   HGBA1C 7.7 (H) 05/16/2018    Lab Results  Component Value Date   MICROALBUR 0.7 11/12/2017    Lab Results  Component Value Date   CHOL 114 05/16/2018   HDL 32.90 (L) 05/16/2018   LDLCALC 48 05/16/2018   TRIG 167.0 (H) 05/16/2018   CHOLHDL 3 05/16/2018     Wt Readings from Last 3 Encounters:  05/20/18 243 lb 12.8 oz (110.6 kg)  04/29/18 237 lb 12.8 oz (107.9 kg)  02/17/18 239 lb (108.4 kg)   BP Readings from Last 3 Encounters:  05/20/18 (!) 110/58  04/29/18 120/82  02/17/18 (!) 116/56   Lab Results  Component Value Date   CREATININE 1.62 (H) 11/12/2017     There are no preventive care reminders to display for this patient. Depression screen [facility] 2/9 02/17/2018 09/28/2017  Decreased Interest 0 0  Down, Depressed, Hopeless 0 0  PHQ - 2 Score 0 0   PMHx, SurgHx, SocialHx, FamHx, Medications, and Allergies were reviewed in the Visit Navigator and updated as appropriate.   Patient Active Problem List   Diagnosis Date Noted  . Morbid obesity ([place]) 12/25/2017  . Thrombocytopenia ([place]) 11/15/2017  . Allergic rhinitis 09/28/2017  . Cardiac defibrillator in place 09/28/2017  . Congestive heart failure ([place]) 09/28/2017  . CAD (coronary artery disease), native coronary artery 09/28/2017  . Hyperlipidemia associated with type 2 diabetes mellitus ([place]) 09/28/2017  . GERD (gastroesophageal reflux disease) 09/28/2017  . Mitral valve incompetence 09/28/2017  . 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 History of actinic keratoses 09/12/2015  . 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 Enthesopathy of hip region 01/04/2015  . DM (diabetes  mellitus), type 2 ([place]) 09/04/2013  . Hypertension associated with diabetes ([place]) 09/04/2013  . 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 Abnormal nuclear stress test 08/26/2011  . Abnormal echocardiogram 08/26/2011  . Systolic and diastolic CHF, chronic ([place]) 08/26/2011   Social History   Tobacco Use  . Smoking status: Former Research scientist (life sciences)  . Smokeless tobacco: Never Used  . Tobacco comment: remote at 20;  Substance Use Topics  . Alcohol use: No    Frequency: Never  . Drug use: No   Current Medications and Allergies:   Current Outpatient Medications:  .  aspirin 325 MG tablet, Take 325 mg by mouth., Disp: , Rfl:  .  Azelastine HCl 0.15 % SOLN, Place 1 spray daily into both nostrils., Disp: 90 mL, Rfl: 1 .  Blood Glucose Monitoring Suppl (FREESTYLE FREEDOM LITE) w/Device KIT, USE TO CHECK BLOOD SUGAR AS DIRECTED, Disp: 1 each, Rfl: 0 .  carvedilol (COREG) 25 MG tablet, TAKE 1 TABLET TWICE A DAY, Disp: , Rfl:  .  digoxin (LANOXIN) 0.125 MG tablet, TAKE 1 TABLET DAILY (Patient taking differently: TAKE 1 TABLET m/w/f), Disp: 90 tablet, Rfl: 1 .  ezetimibe-simvastatin (VYTORIN) 10-40 MG tablet, TAKE 1 TABLET DAILY, Disp: 90 tablet, Rfl: 1 .  fluticasone (FLONASE) 50 MCG/ACT nasal spray, Place 1 spray 2 (two) times daily as needed into both nostrils for allergies or rhinitis., Disp: 48 g, Rfl: 1 .  furosemide (LASIX) 40 MG tablet, TAKE 1 TABLET TWICE A DAY, Disp: 180 tablet, Rfl: 1 .  glucose blood test strip, Use as instructed, Disp: 300 each, Rfl: 3 .  insulin aspart (NOVOLOG) 100 UNIT/ML FlexPen, INJECT 30 UNITS UNDER THE SKIN THREE TIMES A DAY WITH MEALS (Patient taking differently: INJECT 30 UNITS UNDER THE SKIN THREE TIMES A DAY WITH MEALS), Disp: 35 pen, Rfl: 1 .  Insulin Glargine (LANTUS SOLOSTAR) 100 UNIT/ML Solostar Pen, INJECT 78 UNITS UNDER THE SKIN DAILY, Disp: , Rfl:  .  isosorbide mononitrate (IMDUR) 30 MG 24 hr tablet, Take 1 tablet (30 mg total) by mouth daily., Disp:  90 tablet, Rfl: 0 .  Lancets (FREESTYLE) lancets, USE AS INSTRUCTED, Disp: 400 each, Rfl: 1 .  Multiple Vitamin (THERA) TABS, Take by mouth., Disp: , Rfl:  .  nitroGLYCERIN (NITROSTAT) 0.4 MG SL tablet, Place 0.4 mg under the tongue., Disp: , Rfl:  .  pantoprazole (PROTONIX) 40 MG tablet, Take 1 tablet (40 mg total) by mouth daily., Disp: 90 tablet, Rfl: 1 .  potassium chloride SA (KLOR-CON M20) 20 MEQ tablet, TAKE 1 TABLET DAILY WITH LASIX, Disp: 90 tablet, Rfl: 1 .  ranitidine (ZANTAC) 150 MG capsule, Take 1 capsule (150 mg total) by mouth every evening., Disp: 90 capsule, Rfl: 3 .  sacubitril-valsartan (ENTRESTO) 49-51 MG, Take 1 tablet by mouth 2 (two) times daily., Disp: , Rfl:  .  SURE COMFORT PEN NEEDLES 31G X 5 MM MISC, , Disp: , Rfl:   No Known Allergies Review of Systems   Pertinent items are noted in the HPI. Otherwise, ROS is negative.  Vitals:   Vitals:   05/20/18 1108  BP: (!) 110/58  Pulse: 63  Temp: 98.2 F (36.8 C)  TempSrc: Oral  SpO2: 95%  Weight: 243 lb 12.8 oz (110.6 kg)  Height: 6' (1.829 m)     Body mass index is 33.07 kg/m.  Physical Exam:   Physical Exam  Constitutional: He is oriented to person, place, and time. He appears well-developed and well-nourished. No distress.  HENT:  Head: Normocephalic and atraumatic.  Right Ear: External ear normal.  Left Ear: External ear normal.  Nose: Nose normal.  Mouth/Throat: Oropharynx is clear and moist.  Eyes: Pupils are equal, round, and reactive to light. Conjunctivae and EOM are normal.  Neck: Normal range of motion. Neck supple.  Cardiovascular: Normal rate, regular rhythm, normal heart sounds and intact distal pulses.  Pulmonary/Chest: Effort normal and breath sounds normal.  Abdominal: Soft. Bowel sounds are normal.  Musculoskeletal: Normal range of motion.  Neurological: He is alert and oriented to person, place, and time.  Skin: Skin is warm and dry.  Psychiatric: He has a normal mood and affect.  His behavior is normal. Judgment and thought content normal.  Nursing note and vitals reviewed.  Results for orders placed or performed in visit on 05/16/18  Hemoglobin A1c  Result Value Ref Range   Hgb A1c MFr Bld 7.7 (H) 4.6 - 6.5 %  Lipid panel  Result Value Ref Range   Cholesterol 114 0 - 200 mg/dL   Triglycerides 167.0 (H) 0.0 - 149.0 mg/dL   HDL 32.90 (L) >39.00 mg/dL   VLDL 33.4 0.0 - 40.0 mg/dL   LDL Cholesterol 48 0 - 99 mg/dL   Total CHOL/HDL Ratio 3    NonHDL 80.95     Assessment and Plan:   Tynell was seen today for follow-up.  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 Reviewed last visit with Nephrology and Podiatry with patient. Continue current treatment. Follow up in 3 months.   . Reviewed expectations re: course of current medical issues. . Discussed self-management of symptoms. . Outlined signs and symptoms indicating need for more acute intervention. . Patient verbalized understanding and all questions were answered. Marland Kitchen Health Maintenance issues including appropriate healthy diet, exercise, and smoking avoidance were discussed with patient. . See orders for this visit as documented in the electronic medical record. . Patient received an After Visit Summary.  [name] Deutscher, DO [place] 05/22/2018  Future Appointments  Date Time Provider [place]  08/23/2018  9:00 AM [name] Deutscher, DO [facility]

## 2018-05-20 ENCOUNTER — Ambulatory Visit (INDEPENDENT_AMBULATORY_CARE_PROVIDER_SITE_OTHER): Payer: Medicare Other | Admitting: Family Medicine

## 2018-05-20 ENCOUNTER — Encounter: Payer: Self-pay | Admitting: Family Medicine

## 2018-05-20 VITALS — BP 110/58 | HR 63 | Temp 98.2°F | Ht 72.0 in | Wt 243.8 lb

## 2018-05-20 DIAGNOSIS — E1122 Type 2 diabetes mellitus with diabetic chronic kidney disease: Secondary | ICD-10-CM | POA: Diagnosis not present

## 2018-05-20 DIAGNOSIS — N182 Chronic kidney disease, stage 2 (mild): Secondary | ICD-10-CM

## 2018-05-20 DIAGNOSIS — E785 Hyperlipidemia, unspecified: Secondary | ICD-10-CM

## 2018-05-20 DIAGNOSIS — E1169 Type 2 diabetes mellitus with other specified complication: Secondary | ICD-10-CM | POA: Diagnosis not present

## 2018-05-20 DIAGNOSIS — I129 Hypertensive chronic kidney disease with stage 1 through stage 4 chronic kidney disease, or unspecified chronic kidney disease: Secondary | ICD-10-CM | POA: Diagnosis not present

## 2018-05-22 ENCOUNTER — Encounter: Payer: Self-pay | Admitting: Family Medicine

## 2018-06-13 ENCOUNTER — Other Ambulatory Visit: Payer: Self-pay | Admitting: Family Medicine

## 2018-06-13 DIAGNOSIS — K219 Gastro-esophageal reflux disease without esophagitis: Secondary | ICD-10-CM

## 2018-06-18 ENCOUNTER — Other Ambulatory Visit: Payer: Self-pay | Admitting: Family Medicine

## 2018-06-30 ENCOUNTER — Other Ambulatory Visit: Payer: Self-pay

## 2018-06-30 MED ORDER — CARVEDILOL 25 MG PO TABS: 25.0000 mg | ORAL_TABLET | Freq: Two times a day (BID) | ORAL | 3 refills | Status: DC

## 2018-07-04 ENCOUNTER — Other Ambulatory Visit: Payer: Self-pay

## 2018-07-04 ENCOUNTER — Encounter: Payer: Self-pay | Admitting: Family Medicine

## 2018-07-04 MED ORDER — AZELASTINE HCL 0.15 % NA SOLN: 1.0000 | Freq: Every day | NASAL | 0 refills | Status: DC

## 2018-07-05 ENCOUNTER — Encounter: Payer: Self-pay | Admitting: Family Medicine

## 2018-07-06 ENCOUNTER — Other Ambulatory Visit: Payer: Self-pay

## 2018-07-06 MED ORDER — SURE COMFORT PEN NEEDLES 31G X 5 MM MISC: 1.0000 | Freq: Three times a day (TID) | 3 refills | Status: DC

## 2018-07-13 DIAGNOSIS — Z4502 Encounter for adjustment and management of automatic implantable cardiac defibrillator: Secondary | ICD-10-CM | POA: Diagnosis not present

## 2018-08-06 ENCOUNTER — Other Ambulatory Visit: Payer: Self-pay | Admitting: Family Medicine

## 2018-08-16 DIAGNOSIS — E1169 Type 2 diabetes mellitus with other specified complication: Secondary | ICD-10-CM | POA: Diagnosis not present

## 2018-08-16 DIAGNOSIS — Z950 Presence of cardiac pacemaker: Secondary | ICD-10-CM | POA: Diagnosis not present

## 2018-08-16 DIAGNOSIS — I255 Ischemic cardiomyopathy: Secondary | ICD-10-CM | POA: Diagnosis not present

## 2018-08-16 DIAGNOSIS — I5042 Chronic combined systolic (congestive) and diastolic (congestive) heart failure: Secondary | ICD-10-CM | POA: Diagnosis not present

## 2018-08-16 DIAGNOSIS — I340 Nonrheumatic mitral (valve) insufficiency: Secondary | ICD-10-CM | POA: Diagnosis not present

## 2018-08-23 ENCOUNTER — Ambulatory Visit (INDEPENDENT_AMBULATORY_CARE_PROVIDER_SITE_OTHER): Payer: Medicare Other | Admitting: Family Medicine

## 2018-08-23 ENCOUNTER — Encounter: Payer: Self-pay | Admitting: Family Medicine

## 2018-08-23 VITALS — BP 118/60 | HR 60 | Temp 98.6°F | Ht 72.0 in | Wt 240.6 lb

## 2018-08-23 DIAGNOSIS — Z23 Encounter for immunization: Secondary | ICD-10-CM | POA: Diagnosis not present

## 2018-08-23 DIAGNOSIS — I10 Essential (primary) hypertension: Secondary | ICD-10-CM

## 2018-08-23 DIAGNOSIS — R202 Paresthesia of skin: Secondary | ICD-10-CM | POA: Diagnosis not present

## 2018-08-23 DIAGNOSIS — E1159 Type 2 diabetes mellitus with other circulatory complications: Secondary | ICD-10-CM

## 2018-08-23 DIAGNOSIS — I152 Hypertension secondary to endocrine disorders: Secondary | ICD-10-CM

## 2018-08-23 DIAGNOSIS — E1122 Type 2 diabetes mellitus with diabetic chronic kidney disease: Secondary | ICD-10-CM

## 2018-08-23 DIAGNOSIS — N182 Chronic kidney disease, stage 2 (mild): Secondary | ICD-10-CM | POA: Diagnosis not present

## 2018-08-23 DIAGNOSIS — I129 Hypertensive chronic kidney disease with stage 1 through stage 4 chronic kidney disease, or unspecified chronic kidney disease: Secondary | ICD-10-CM

## 2018-08-23 DIAGNOSIS — E785 Hyperlipidemia, unspecified: Secondary | ICD-10-CM

## 2018-08-23 DIAGNOSIS — L989 Disorder of the skin and subcutaneous tissue, unspecified: Secondary | ICD-10-CM

## 2018-08-23 DIAGNOSIS — E1169 Type 2 diabetes mellitus with other specified complication: Secondary | ICD-10-CM | POA: Diagnosis not present

## 2018-08-23 LAB — POCT GLYCOSYLATED HEMOGLOBIN (HGB A1C): Hemoglobin A1C: 7.2 % — AB (ref 4.0–5.6)

## 2018-08-23 NOTE — Patient Instructions (Signed)
..  It takes about 2 weeks for protection to develop after vaccination.  There are many flu viruses, and they are always changing. Each year a new flu vaccine is made to protect against three or four viruses that are likely to cause disease in the upcoming flu season. Even when the vaccine doesn't exactly match these viruses, it may still provide some protection.   Influenza vaccine does not cause flu.  Influenza vaccine may be given at the same time as other vaccines.  3. Talk with your health care provider  Tell your vaccine provider if the person getting the vaccine: ; Has had an allergic reaction after a previous dose of influenza vaccine, or has any severe, life-threatening allergies.  ; Has ever had Guillain-Barr Syndrome (also called GBS).  In some cases, your health care provider may decide to postpone influenza vaccination to a future visit.  People with minor illnesses, such as a cold, may be vaccinated. People who are moderately or severely ill should usually wait until they recover before getting influenza vaccine.  Your health care provider can give you more information.  4. Risks of a reaction  ; Soreness, redness, and swelling where shot is given, fever, muscle aches, and headache can happen after influenza vaccine. ; There may be a very small increased risk of Guillain-Barr Syndrome (GBS) after inactivated influenza vaccine (the flu shot).  Young children who get the flu shot along with pneumococcal vaccine (PCV13), and/or DTaP vaccine at the same time might be slightly more likely to have a seizure caused by fever. Tell your health care provider if a child who is getting flu vaccine has ever had a seizure.  People sometimes faint after medical procedures, including vaccination. Tell your provider if you feel dizzy or have vision changes or ringing in the ears.  As with any medicine, there is a very remote chance of a vaccine causing a severe allergic reaction, other  serious injury, or death.  5. What if there is a serious problem?  An allergic reaction could occur after the vaccinated person leaves the clinic. If you see signs of a severe allergic reaction (hives, swelling of the face and throat, difficulty breathing, a fast heartbeat, dizziness, or weakness), call 9-1-1 and get the person to the nearest hospital.  For other signs that concern you, call your health care provider.  Adverse reactions should be reported to the Vaccine Adverse Event Reporting System (VAERS). Your health care provider will usually file this report, or you can do it yourself. Visit the VAERS website at www.vaers.hhs.gov or call 1-800-822-7967.  VAERS is only for reporting reactions, and VAERS staff do not give medical advice.  6. The National Vaccine Injury Compensation Program  The National Vaccine Injury Compensation Program (VICP) is a federal program that was created to compensate people who may have been injured by certain vaccines. Visit the VICP website at www.hrsa.gov/vaccinecompensation or call 1-800-338-2382 to learn about the program and about filing a claim. There is a time limit to file a claim for compensation.  7. How can I learn more?  ; Ask your health care provider.  ; Call your local or state health department. ; Contact the Centers for Disease Control and Prevention (CDC): - Call 1-800-232-4636 (1-800-CDC-INFO) or - Visit CDC's influenza website at www.cdc.gov/flu  Vaccine Information Statement (Interim) Inactivated Influenza Vaccine  07/21/2018 42 U.S.C.  300aa-26   Department of Health and Human Services Centers for Disease Control and Prevention  Office Use Only  

## 2018-08-23 NOTE — Progress Notes (Signed)
[name] is a 77 y.o. male is here for follow up.  History of Present Illness:   [name], [name] acting as scribe for [name].   HPI: Patient in office for follow up. He has spot on left hand near thumb that comes and goes. It can be itchy at times. He has been putting iodine on it and it help with itching.   [name] is a 77 y.o. male who presents for 6 month follow up. He says he is doing well. He is able to complete all activities that he wishes to do. He is playing golf. He walks several times a week. He continues to have dyspnea with inclines that recovers quickly within a few mintures. He denies orthopnea or PND. No edema. No chest pain. No ICD shocks.  Current symptoms: no polyuria or polydipsia, no chest pain, dyspnea or TIA's, no numbness, tingling or pain in extremities.  Taking medication compliantly without noted sided effects [x]   YES  []   NO  Episodes of hypoglycemia? []   YES  [x]   NO Maintaining a diabetic diet? [x]   YES  []   NO Trying to exercise on a regular basis? [x]   YES  []   NO  On ACE inhibitor or angiotensin II receptor blocker? [x]   YES  []   NO On Aspirin? [x]   YES  []   NO  Lab Results  Component Value Date   HGBA1C 7.7 (H) 05/16/2018    Lab Results  Component Value Date   MICROALBUR 0.7 11/12/2017    Lab Results  Component Value Date   CHOL 114 05/16/2018   HDL 32.90 (L) 05/16/2018   LDLCALC 48 05/16/2018   TRIG 167.0 (H) 05/16/2018   CHOLHDL 3 05/16/2018     Wt Readings from Last 3 Encounters:  08/23/18 240 lb 9.6 oz (109.1 kg)  05/20/18 243 lb 12.8 oz (110.6 kg)  04/29/18 237 lb 12.8 oz (107.9 kg)   BP Readings from Last 3 Encounters:  08/23/18 118/60  05/20/18 (!) 110/58  04/29/18 120/82   Lab Results  Component Value Date   CREATININE 1.62 (H) 11/12/2017   Health Maintenance Due  Topic Date Due  . INFLUENZA VACCINE  07/07/2018   Depression screen [facility] 2/9 02/17/2018 09/28/2017  Decreased Interest 0 0    Down, Depressed, Hopeless 0 0  PHQ - 2 Score 0 0   PMHx, SurgHx, SocialHx, FamHx, Medications, and Allergies were reviewed in the Visit Navigator and updated as appropriate.   Patient Active Problem List   Diagnosis Date Noted  . Morbid obesity ([place]) 12/25/2017  . Thrombocytopenia ([place]) 11/15/2017  . Allergic rhinitis 09/28/2017  . Cardiac defibrillator in place 09/28/2017  . Congestive heart failure ([place]) 09/28/2017  . CAD (coronary artery disease), native coronary artery 09/28/2017  . Hyperlipidemia associated with type 2 diabetes mellitus ([place]) 09/28/2017  . GERD (gastroesophageal reflux disease) 09/28/2017  . Mitral valve incompetence 09/28/2017  . 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 History of actinic keratoses 09/12/2015  . 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 Enthesopathy of hip region 01/04/2015  . DM (diabetes mellitus), type 2 ([place]) 09/04/2013  . Hypertension associated with diabetes ([place]) 09/04/2013  . 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 Abnormal nuclear stress test 08/26/2011  . Abnormal echocardiogram 08/26/2011  . Systolic and diastolic CHF, chronic ([place]) 08/26/2011   Social History   Tobacco Use  . Smoking status: Former Research scientist (life sciences)  . Smokeless  tobacco: Never Used  . Tobacco comment: remote at 20;  Substance Use Topics  . Alcohol use: No    Frequency: Never  . Drug use: No   Current Medications and Allergies:   .  aspirin 325 MG tablet, Take 325 mg by mouth., Disp: , Rfl:  .  Azelastine HCl 0.15 % SOLN, Place 1 spray into both nostrils daily., Disp: 90 mL, Rfl: 0 .  carvedilol (COREG) 25 MG tablet, Take 1 tablet (25 mg total) by mouth 2 (two) times daily., Disp: 180 tablet, Rfl: 3 .  digoxin (LANOXIN) 0.125 MG tablet, TAKE 1 TABLET DAILY (Patient taking differently: TAKE 1 TABLET m/w/f), Disp: 90 tablet, Rfl: 1 .  ezetimibe-simvastatin (VYTORIN) 10-40 MG tablet, TAKE 1 TABLET DAILY, Disp: 90 tablet, Rfl: 1 .   fluticasone (FLONASE) 50 MCG/ACT nasal spray, Place 1 spray 2 (two) times daily as needed into both nostrils for allergies or rhinitis., Disp: 48 g, Rfl: 1 .  furosemide (LASIX) 40 MG tablet, TAKE 1 TABLET TWICE A DAY, Disp: 180 tablet, Rfl: 1 .  insulin aspart (NOVOLOG FLEXPEN) 100 UNIT/ML FlexPen, INJECT 30 UNITS UNDER THE SKIN THREE TIMES A DAY WITH MEALS, Disp: 105 mL, Rfl: 1 .  Insulin Glargine (LANTUS SOLOSTAR) 100 UNIT/ML Solostar Pen, INJECT 78 UNITS UNDER THE SKIN DAILY, Disp: , Rfl:  .  isosorbide mononitrate (IMDUR) 30 MG 24 hr tablet, TAKE 1 TABLET DAILY, Disp: 90 tablet, Rfl: 1 .  Multiple Vitamin (THERA) TABS, Take by mouth., Disp: , Rfl:  .  nitroGLYCERIN (NITROSTAT) 0.4 MG SL tablet, Place 0.4 mg under the tongue., Disp: , Rfl:  .  pantoprazole (PROTONIX) 40 MG tablet, TAKE 1 TABLET DAILY, Disp: 90 tablet, Rfl: 1 .  potassium chloride SA (KLOR-CON M20) 20 MEQ tablet, TAKE 1 TABLET DAILY WITH LASIX, Disp: 90 tablet, Rfl: 1 .  ranitidine (ZANTAC) 150 MG capsule, Take 1 capsule (150 mg total) by mouth every evening., Disp: 90 capsule, Rfl: 3 .  sacubitril-valsartan (ENTRESTO) 49-51 MG, Take 1 tablet by mouth 2 (two) times daily., Disp: , Rfl:  .  SURE COMFORT PEN NEEDLES 31G X 5 MM MISC, Inject 1 Device into the skin 3 (three) times daily., Disp: 360 each, Rfl: 3  No Known Allergies Review of Systems   Pertinent items are noted in the HPI. Otherwise, ROS is negative.  Vitals:   Vitals:   08/23/18 0900  BP: 118/60  Pulse: 60  Temp: 98.6 F (37 C)  TempSrc: Oral  SpO2: 97%  Weight: 240 lb 9.6 oz (109.1 kg)  Height: 6' (1.829 m)     Body mass index is 32.63 kg/m.  Physical Exam:   Physical Exam  Constitutional: He is oriented to person, place, and time. He appears well-developed and well-nourished. No distress.  HENT:  Head: Normocephalic and atraumatic.  Right Ear: External ear normal.  Left Ear: External ear normal.  Nose: Nose normal.  Mouth/Throat: Oropharynx  is clear and moist.  Eyes: Pupils are equal, round, and reactive to light. Conjunctivae and EOM are normal.  Neck: Normal range of motion. Neck supple.  Cardiovascular: Normal rate, regular rhythm and intact distal pulses.  Pulmonary/Chest: Effort normal and breath sounds normal.  Abdominal: Soft. Bowel sounds are normal.  Musculoskeletal: Normal range of motion.  Neurological: He is alert and oriented to person, place, and time.  Skin: Skin is warm and dry.  Irritated and enlarging SK/AK on left hand, right arm, left scalp.  Psychiatric: He has a normal mood  and affect. His behavior is normal. Judgment and thought content normal.  Nursing note and vitals reviewed.  Assessment and Plan:   [name] was seen today for follow-up.  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 Aftercare reviewed.  . Reviewed expectations re: course of current medical issues. . Discussed self-management of symptoms. . Outlined signs and symptoms indicating need for more acute intervention. . Patient verbalized understanding and all questions were answered. Marland Kitchen Health Maintenance issues including appropriate healthy diet, exercise, and smoking avoidance were discussed with patient. . See orders for this visit as documented in the electronic medical record. . Patient received an After Visit Summary.  [name] served as Education administrator during this visit. History, Physical, and Plan performed by medical provider. The above documentation has been reviewed and is accurate and complete. [name] Barrett, D.O.  [name] Deutscher, DO [place] 08/23/2018

## 2018-08-24 DIAGNOSIS — Z95810 Presence of automatic (implantable) cardiac defibrillator: Secondary | ICD-10-CM | POA: Diagnosis not present

## 2018-08-24 DIAGNOSIS — I255 Ischemic cardiomyopathy: Secondary | ICD-10-CM | POA: Diagnosis not present

## 2018-08-26 ENCOUNTER — Other Ambulatory Visit: Payer: Self-pay

## 2018-08-26 MED ORDER — INSULIN GLARGINE 100 UNIT/ML SOLOSTAR PEN: PEN_INJECTOR | SUBCUTANEOUS | 4 refills | Status: DC

## 2018-09-27 ENCOUNTER — Other Ambulatory Visit: Payer: Self-pay | Admitting: Family Medicine

## 2018-10-12 DIAGNOSIS — Z872 Personal history of diseases of the skin and subcutaneous tissue: Secondary | ICD-10-CM | POA: Diagnosis not present

## 2018-10-12 DIAGNOSIS — L570 Actinic keratosis: Secondary | ICD-10-CM | POA: Diagnosis not present

## 2018-10-12 DIAGNOSIS — B351 Tinea unguium: Secondary | ICD-10-CM | POA: Diagnosis not present

## 2018-10-14 DIAGNOSIS — B351 Tinea unguium: Secondary | ICD-10-CM | POA: Insufficient documentation

## 2018-10-14 DIAGNOSIS — L570 Actinic keratosis: Secondary | ICD-10-CM | POA: Insufficient documentation

## 2018-10-15 ENCOUNTER — Other Ambulatory Visit: Payer: Self-pay | Admitting: Family Medicine

## 2018-10-17 DIAGNOSIS — I255 Ischemic cardiomyopathy: Secondary | ICD-10-CM | POA: Diagnosis not present

## 2018-10-19 ENCOUNTER — Other Ambulatory Visit: Payer: Self-pay | Admitting: Family Medicine

## 2018-11-05 ENCOUNTER — Other Ambulatory Visit: Payer: Self-pay | Admitting: Family Medicine

## 2018-11-23 ENCOUNTER — Ambulatory Visit: Payer: Medicare Other | Admitting: Family Medicine

## 2018-12-05 NOTE — Progress Notes (Signed)
[name] is a 77 y.o. male is here for follow up.  History of Present Illness:   HPI:   1. 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 Medication compliance: compliant all of the time, diabetic diet compliance: compliant most of the time, home glucose monitoring: is performed regularly, further diabetic ROS: no polyuria or polydipsia, no chest pain, dyspnea or TIA's, no numbness, tingling or pain in extremities.  Is the patient taking medications without problems? Yes. Does the patient complain of muscle aches?  No. Trying to exercise on a regular basis? Yes.     Health Maintenance Due  Topic Date Due  . OPHTHALMOLOGY EXAM  10/07/2018  . URINE MICROALBUMIN  11/12/2018   Depression screen [facility] 2/9 12/06/2018 02/17/2018 09/28/2017  Decreased Interest 0 0 0  Down, Depressed, Hopeless 0 0 0  PHQ - 2 Score 0 0 0  Altered sleeping 0 - -  Tired, decreased energy 0 - -  Change in appetite 0 - -  Feeling bad or failure about yourself  0 - -  Trouble concentrating 0 - -  Moving slowly or fidgety/restless 0 - -  Suicidal thoughts 0 - -  PHQ-9 Score 0 - -  Difficult doing work/chores Not difficult at all - -   PMHx, SurgHx, SocialHx, FamHx, Medications, and Allergies were reviewed in the Visit Navigator and updated as appropriate.   Patient Active Problem List   Diagnosis Date Noted  . Actinic keratosis 10/14/2018  . Onychomycosis 10/14/2018  . Morbid obesity ([place]) 12/25/2017  . Thrombocytopenia ([place]) 11/15/2017  . Allergic rhinitis 09/28/2017  . Cardiac defibrillator in place 09/28/2017  . Congestive heart failure ([place]) 09/28/2017  . CAD (coronary artery disease), native coronary artery 09/28/2017  . Hyperlipidemia associated with type 2 diabetes mellitus ([place]) 09/28/2017  . GERD (gastroesophageal reflux disease) 09/28/2017  . Mitral valve incompetence 09/28/2017  . 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 History of actinic keratosis 09/12/2015  . Type 2 DM with CKD  stage 2 and hypertension ([place]) 01/04/2015  . Enthesopathy of hip region 01/04/2015  . DM (diabetes mellitus), type 2 ([place]) 09/04/2013  . Hypertension associated with diabetes ([place]) 09/04/2013  . 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 Abnormal nuclear stress test 08/26/2011  . Abnormal echocardiogram 08/26/2011  . Systolic and diastolic CHF, chronic ([place]) 08/26/2011   Social History   Tobacco Use  . Smoking status: Former Research scientist (life sciences)  . Smokeless tobacco: Never Used  . Tobacco comment: remote at 20;  Substance Use Topics  . Alcohol use: No    Frequency: Never  . Drug use: No   Current Medications and Allergies:   .  aspirin 325 MG tablet, Take 325 mg by mouth., Disp: , Rfl:  .  Azelastine HCl 0.15 % SOLN, Place 1 spray into both nostrils daily., Disp: 90 mL, Rfl: 0 .  Blood Glucose Monitoring Suppl (FREESTYLE FREEDOM LITE) w/Device KIT, USE TO CHECK BLOOD SUGAR AS DIRECTED, Disp: 1 each, Rfl: 0 .  carvedilol (COREG) 25 MG tablet, Take 1 tablet (25 mg total) by mouth 2 (two) times daily., Disp: 180 tablet, Rfl: 3 .  digoxin (LANOXIN) 0.125 MG tablet, TAKE 1 TABLET DAILY (Patient taking differently: Three times a week), Disp: 90 tablet, Rfl: 1 .  ezetimibe-simvastatin (VYTORIN) 10-40 MG tablet, TAKE 1 TABLET DAILY, Disp: 90 tablet, Rfl: 4 .  fluticasone (FLONASE) 50 MCG/ACT nasal spray, Place 1 spray 2 (two) times daily as needed into both nostrils for  allergies or rhinitis., Disp: 48 g, Rfl: 1 .  furosemide (LASIX) 40 MG tablet, TAKE 1 TABLET TWICE A DAY, Disp: 180 tablet, Rfl: 0 .  glucose blood test strip, Use as instructed, Disp: 300 each, Rfl: 3 .  insulin aspart (NOVOLOG FLEXPEN) 100 UNIT/ML FlexPen, INJECT 30 UNITS UNDER THE SKIN THREE TIMES A DAY WITH MEALS, Disp: 105 mL, Rfl: 1 .  Insulin Glargine (LANTUS SOLOSTAR) 100 UNIT/ML Solostar Pen, INJECT 78 UNITS UNDER THE SKIN DAILY, Disp: 45 mL, Rfl: 4 .  isosorbide mononitrate (IMDUR) 30 MG 24 hr tablet, TAKE 1 TABLET  DAILY, Disp: 90 tablet, Rfl: 1 .  Lancets (FREESTYLE) lancets, USE AS INSTRUCTED, Disp: 400 each, Rfl: 4 .  nitroGLYCERIN (NITROSTAT) 0.4 MG SL tablet, Place 0.4 mg under the tongue., Disp: , Rfl:  .  pantoprazole (PROTONIX) 40 MG tablet, TAKE 1 TABLET DAILY, Disp: 90 tablet, Rfl: 1 .  potassium chloride SA (K-DUR,KLOR-CON) 20 MEQ tablet, TAKE 1 TABLET DAILY WITH LASIX, Disp: 90 tablet, Rfl: 4 .  sacubitril-valsartan (ENTRESTO) 49-51 MG, Take 1 tablet by mouth 2 (two) times daily., Disp: , Rfl:  .  SURE COMFORT PEN NEEDLES 31G X 5 MM MISC, Inject 1 Device into the skin 3 (three) times daily., Disp: 360 each, Rfl: 3  No Known Allergies   Review of Systems   Pertinent items are noted in the HPI. Otherwise, a complete ROS is negative.  Vitals:   Vitals:   12/06/18 0853  BP: 120/60  Pulse: 67  Temp: 97.6 F (36.4 C)  TempSrc: Oral  SpO2: 96%  Weight: 240 lb (108.9 kg)  Height: 6' (1.829 m)     Body mass index is 32.55 kg/m.  Physical Exam:   Physical Exam Vitals signs and nursing note reviewed.  Constitutional:      General: He is not in acute distress.    Appearance: He is well-developed.  HENT:     Head: Normocephalic and atraumatic.     Right Ear: External ear normal.     Left Ear: External ear normal.     Nose: Nose normal.  Eyes:     Conjunctiva/sclera: Conjunctivae normal.     Pupils: Pupils are equal, round, and reactive to light.  Neck:     Musculoskeletal: Normal range of motion and neck supple.  Cardiovascular:     Rate and Rhythm: Normal rate and regular rhythm.     Heart sounds: Normal heart sounds.  Pulmonary:     Effort: Pulmonary effort is normal.     Breath sounds: Normal breath sounds.  Abdominal:     General: Bowel sounds are normal.     Palpations: Abdomen is soft.  Musculoskeletal: Normal range of motion.  Skin:    General: Skin is warm and dry.  Neurological:     Mental Status: He is alert and oriented to person, place, and time.    Psychiatric:        Behavior: Behavior normal.        Thought Content: Thought content normal.        Judgment: Judgment normal.     Results for orders placed or performed in visit on 08/23/18  POCT glycosylated hemoglobin (Hb A1C)  Result Value Ref Range   Hemoglobin A1C 7.2 (A) 4.0 - 5.6 %   HbA1c POC (<> result, manual entry)     HbA1c, POC (prediabetic range)     HbA1c, POC (controlled diabetic range)      Assessment and Plan:  Cadence was seen today for follow-up.  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 Orders and follow up as documented in [place], reviewed diet, exercise and weight control, cardiovascular risk and specific lipid/LDL goals reviewed, reviewed medications and side effects in detail.  . Reviewed expectations re: course of current medical issues. . Outlined signs and symptoms indicating need for more acute intervention. . Patient verbalized understanding and all questions were answered. . Patient received an After Visit Summary.  [name] Deutscher, DO [place], [facility] 12/06/2018

## 2018-12-06 ENCOUNTER — Ambulatory Visit (INDEPENDENT_AMBULATORY_CARE_PROVIDER_SITE_OTHER): Payer: Medicare Other | Admitting: Family Medicine

## 2018-12-06 ENCOUNTER — Encounter: Payer: Self-pay | Admitting: Family Medicine

## 2018-12-06 VITALS — BP 120/60 | HR 67 | Temp 97.6°F | Ht 72.0 in | Wt 240.0 lb

## 2018-12-06 DIAGNOSIS — E1122 Type 2 diabetes mellitus with diabetic chronic kidney disease: Secondary | ICD-10-CM

## 2018-12-06 DIAGNOSIS — E1159 Type 2 diabetes mellitus with other circulatory complications: Secondary | ICD-10-CM

## 2018-12-06 DIAGNOSIS — I152 Hypertension secondary to endocrine disorders: Secondary | ICD-10-CM

## 2018-12-06 DIAGNOSIS — I10 Essential (primary) hypertension: Secondary | ICD-10-CM

## 2018-12-06 DIAGNOSIS — I129 Hypertensive chronic kidney disease with stage 1 through stage 4 chronic kidney disease, or unspecified chronic kidney disease: Secondary | ICD-10-CM

## 2018-12-06 DIAGNOSIS — Z79899 Other long term (current) drug therapy: Secondary | ICD-10-CM | POA: Diagnosis not present

## 2018-12-06 DIAGNOSIS — N182 Chronic kidney disease, stage 2 (mild): Secondary | ICD-10-CM

## 2018-12-06 LAB — CBC WITH DIFFERENTIAL/PLATELET
Basophils Absolute: 0 10*3/uL (ref 0.0–0.1)
Basophils Relative: 0.5 % (ref 0.0–3.0)
Eosinophils Absolute: 0.1 10*3/uL (ref 0.0–0.7)
Eosinophils Relative: 1.1 % (ref 0.0–5.0)
HCT: 41.1 % (ref 39.0–52.0)
Hemoglobin: 14.5 g/dL (ref 13.0–17.0)
Lymphocytes Relative: 20.3 % (ref 12.0–46.0)
Lymphs Abs: 1.4 10*3/uL (ref 0.7–4.0)
MCHC: 35.1 g/dL (ref 30.0–36.0)
MCV: 84.6 fl (ref 78.0–100.0)
Monocytes Absolute: 0.8 10*3/uL (ref 0.1–1.0)
Monocytes Relative: 11.7 % (ref 3.0–12.0)
Neutro Abs: 4.6 10*3/uL (ref 1.4–7.7)
Neutrophils Relative %: 66.4 % (ref 43.0–77.0)
Platelets: 119 10*3/uL — ABNORMAL LOW (ref 150.0–400.0)
RBC: 4.86 Mil/uL (ref 4.22–5.81)
RDW: 13.3 % (ref 11.5–15.5)
WBC: 7 10*3/uL (ref 4.0–10.5)

## 2018-12-06 LAB — COMPREHENSIVE METABOLIC PANEL
ALT: 12 U/L (ref 0–53)
AST: 13 U/L (ref 0–37)
Albumin: 3.8 g/dL (ref 3.5–5.2)
Alkaline Phosphatase: 80 U/L (ref 39–117)
BUN: 23 mg/dL (ref 6–23)
CO2: 28 mEq/L (ref 19–32)
Calcium: 8.5 mg/dL (ref 8.4–10.5)
Chloride: 104 mEq/L (ref 96–112)
Creatinine, Ser: 1.7 mg/dL — ABNORMAL HIGH (ref 0.40–1.50)
GFR: 41.71 mL/min — ABNORMAL LOW (ref >60.00)
Glucose, Bld: 115 mg/dL — ABNORMAL HIGH (ref 70–99)
Potassium: 3.7 mEq/L (ref 3.5–5.1)
Sodium: 140 mEq/L (ref 135–145)
Total Bilirubin: 0.7 mg/dL (ref 0.2–1.2)
Total Protein: 6.1 g/dL (ref 6.0–8.3)

## 2018-12-06 LAB — HEMOGLOBIN A1C: Hgb A1c MFr Bld: 7.4 % — ABNORMAL HIGH (ref 4.6–6.5)

## 2018-12-07 LAB — DIGOXIN LEVEL: Digoxin Level: <0.5 mcg/L — ABNORMAL LOW (ref 0.8–2.0)

## 2018-12-10 ENCOUNTER — Other Ambulatory Visit: Payer: Self-pay | Admitting: Family Medicine

## 2018-12-10 DIAGNOSIS — K219 Gastro-esophageal reflux disease without esophagitis: Secondary | ICD-10-CM

## 2018-12-24 ENCOUNTER — Other Ambulatory Visit: Payer: Self-pay | Admitting: Family Medicine

## 2018-12-26 ENCOUNTER — Other Ambulatory Visit: Payer: Self-pay | Admitting: Family Medicine

## 2019-01-02 ENCOUNTER — Encounter: Payer: Self-pay | Admitting: Family Medicine

## 2019-01-02 MED ORDER — PANTOPRAZOLE SODIUM 40 MG PO TBEC: 40.0000 mg | DELAYED_RELEASE_TABLET | Freq: Two times a day (BID) | ORAL | 3 refills | Status: DC

## 2019-01-02 NOTE — Telephone Encounter (Signed)
Please advise 

## 2019-01-16 DIAGNOSIS — Z4502 Encounter for adjustment and management of automatic implantable cardiac defibrillator: Secondary | ICD-10-CM | POA: Diagnosis not present

## 2019-02-02 ENCOUNTER — Other Ambulatory Visit: Payer: Self-pay | Admitting: Family Medicine

## 2019-02-16 DIAGNOSIS — I2510 Atherosclerotic heart disease of native coronary artery without angina pectoris: Secondary | ICD-10-CM | POA: Diagnosis not present

## 2019-02-16 DIAGNOSIS — I255 Ischemic cardiomyopathy: Secondary | ICD-10-CM | POA: Diagnosis not present

## 2019-02-16 DIAGNOSIS — I2583 Coronary atherosclerosis due to lipid rich plaque: Secondary | ICD-10-CM | POA: Diagnosis not present

## 2019-02-16 DIAGNOSIS — Z950 Presence of cardiac pacemaker: Secondary | ICD-10-CM | POA: Diagnosis not present

## 2019-02-16 DIAGNOSIS — I5042 Chronic combined systolic (congestive) and diastolic (congestive) heart failure: Secondary | ICD-10-CM | POA: Diagnosis not present

## 2019-03-01 ENCOUNTER — Encounter: Payer: Self-pay | Admitting: Family Medicine

## 2019-03-06 ENCOUNTER — Ambulatory Visit: Payer: Medicare Other | Admitting: Family Medicine

## 2019-03-14 ENCOUNTER — Encounter: Payer: Self-pay | Admitting: Family Medicine

## 2019-03-14 MED ORDER — FLUTICASONE PROPIONATE 50 MCG/ACT NA SUSP: 1.0000 | Freq: Two times a day (BID) | NASAL | 1 refills | Status: DC | PRN

## 2019-03-20 DIAGNOSIS — I340 Nonrheumatic mitral (valve) insufficiency: Secondary | ICD-10-CM | POA: Diagnosis not present

## 2019-03-20 DIAGNOSIS — I255 Ischemic cardiomyopathy: Secondary | ICD-10-CM | POA: Diagnosis not present

## 2019-03-28 IMAGING — DX DG THORACIC SPINE 3V
3 series · 3 of 3 positions shown · non-contrast
Comparison: None.

CLINICAL DATA: Chronic right thoracic back pain.  No known injury.

EXAM:
THORACIC SPINE - 3 VIEWS

[thoracic spine ap]
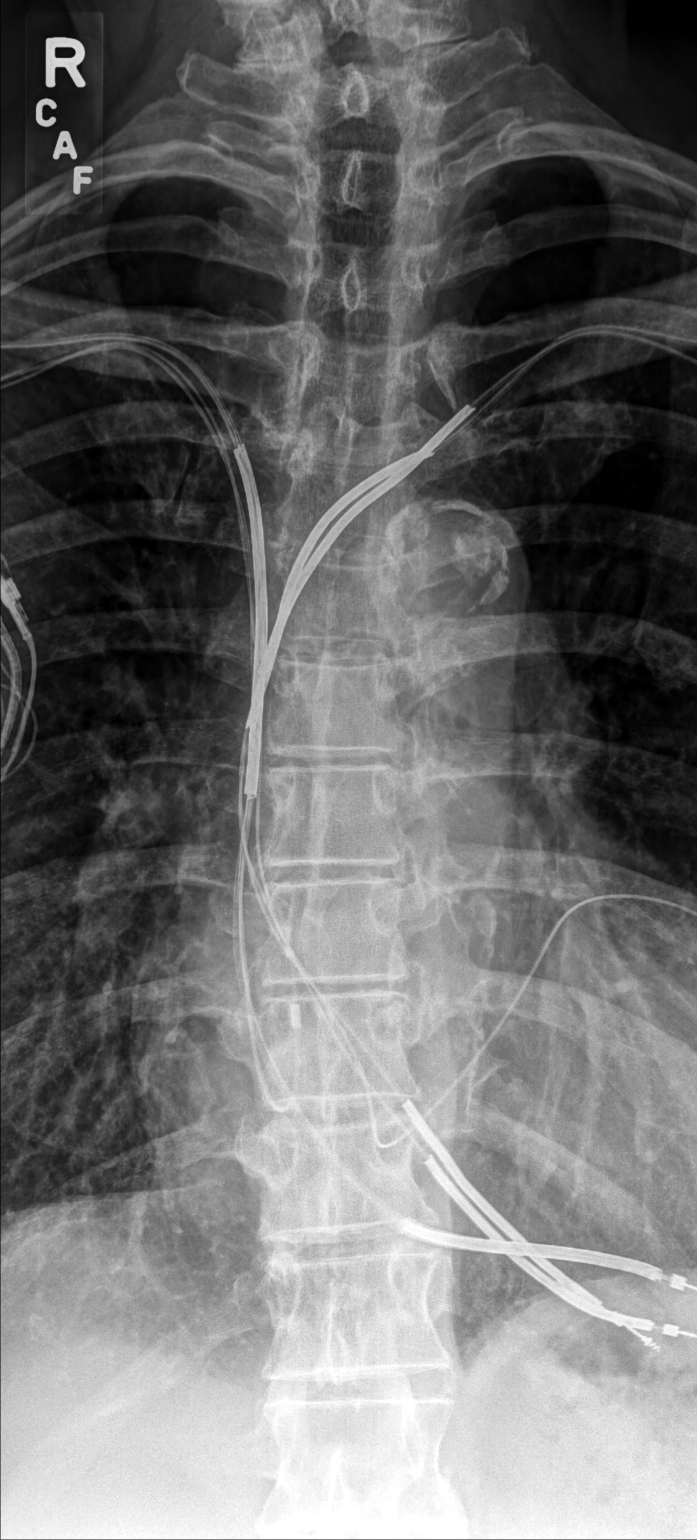

[thoracic spine lat (1 of 2)]
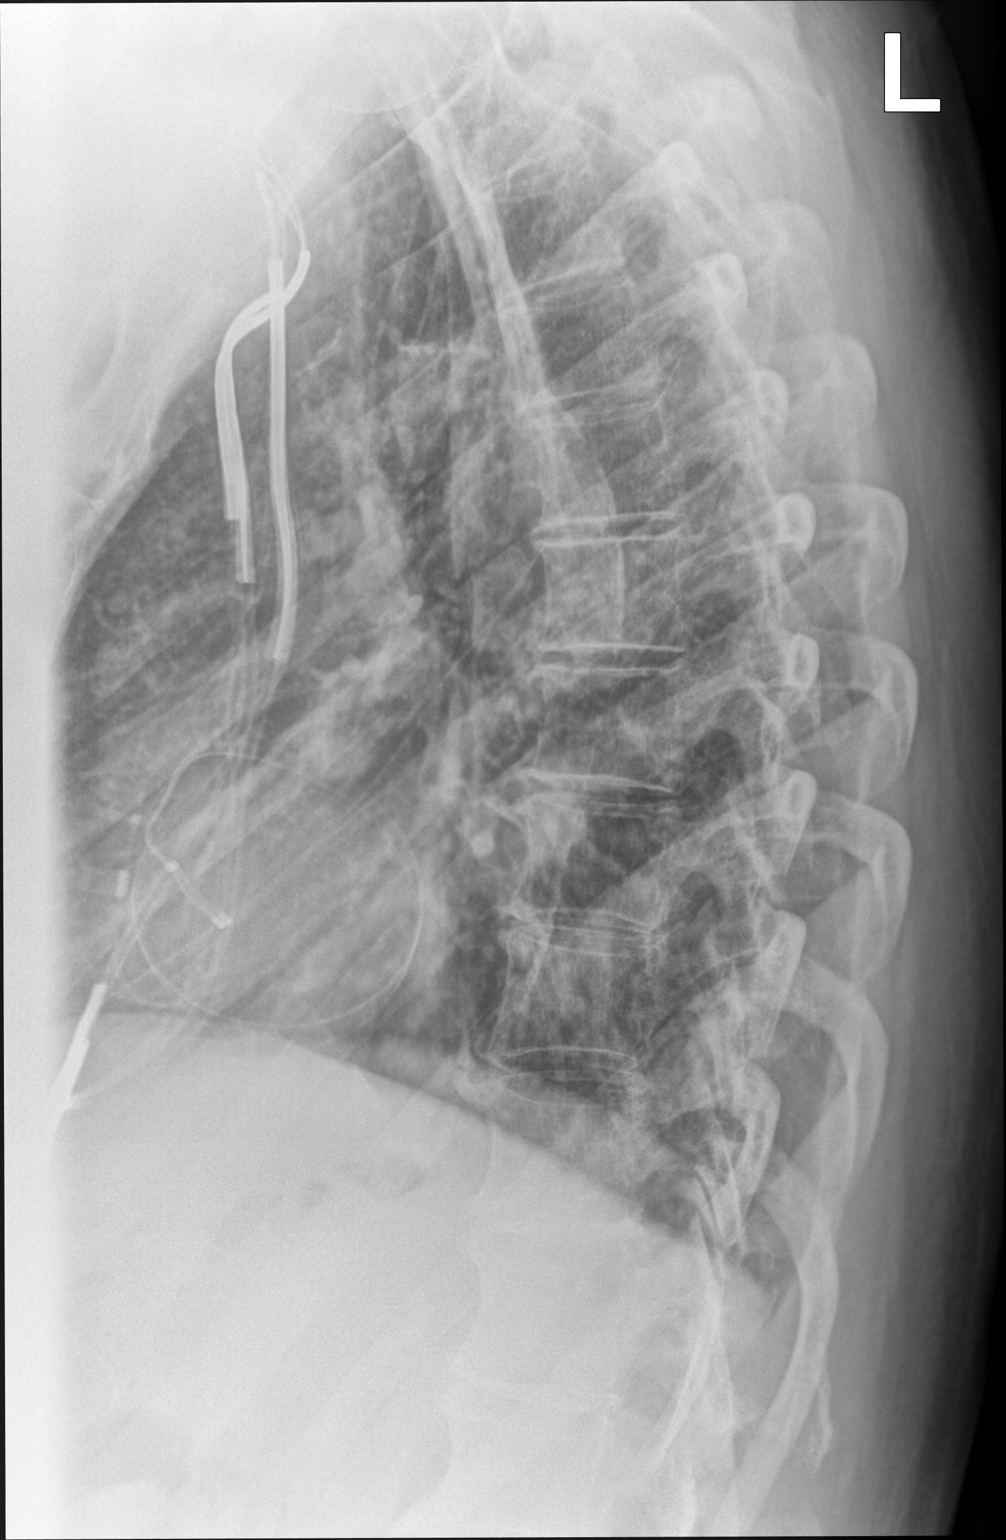

[thoracic spine lat (2 of 2)]
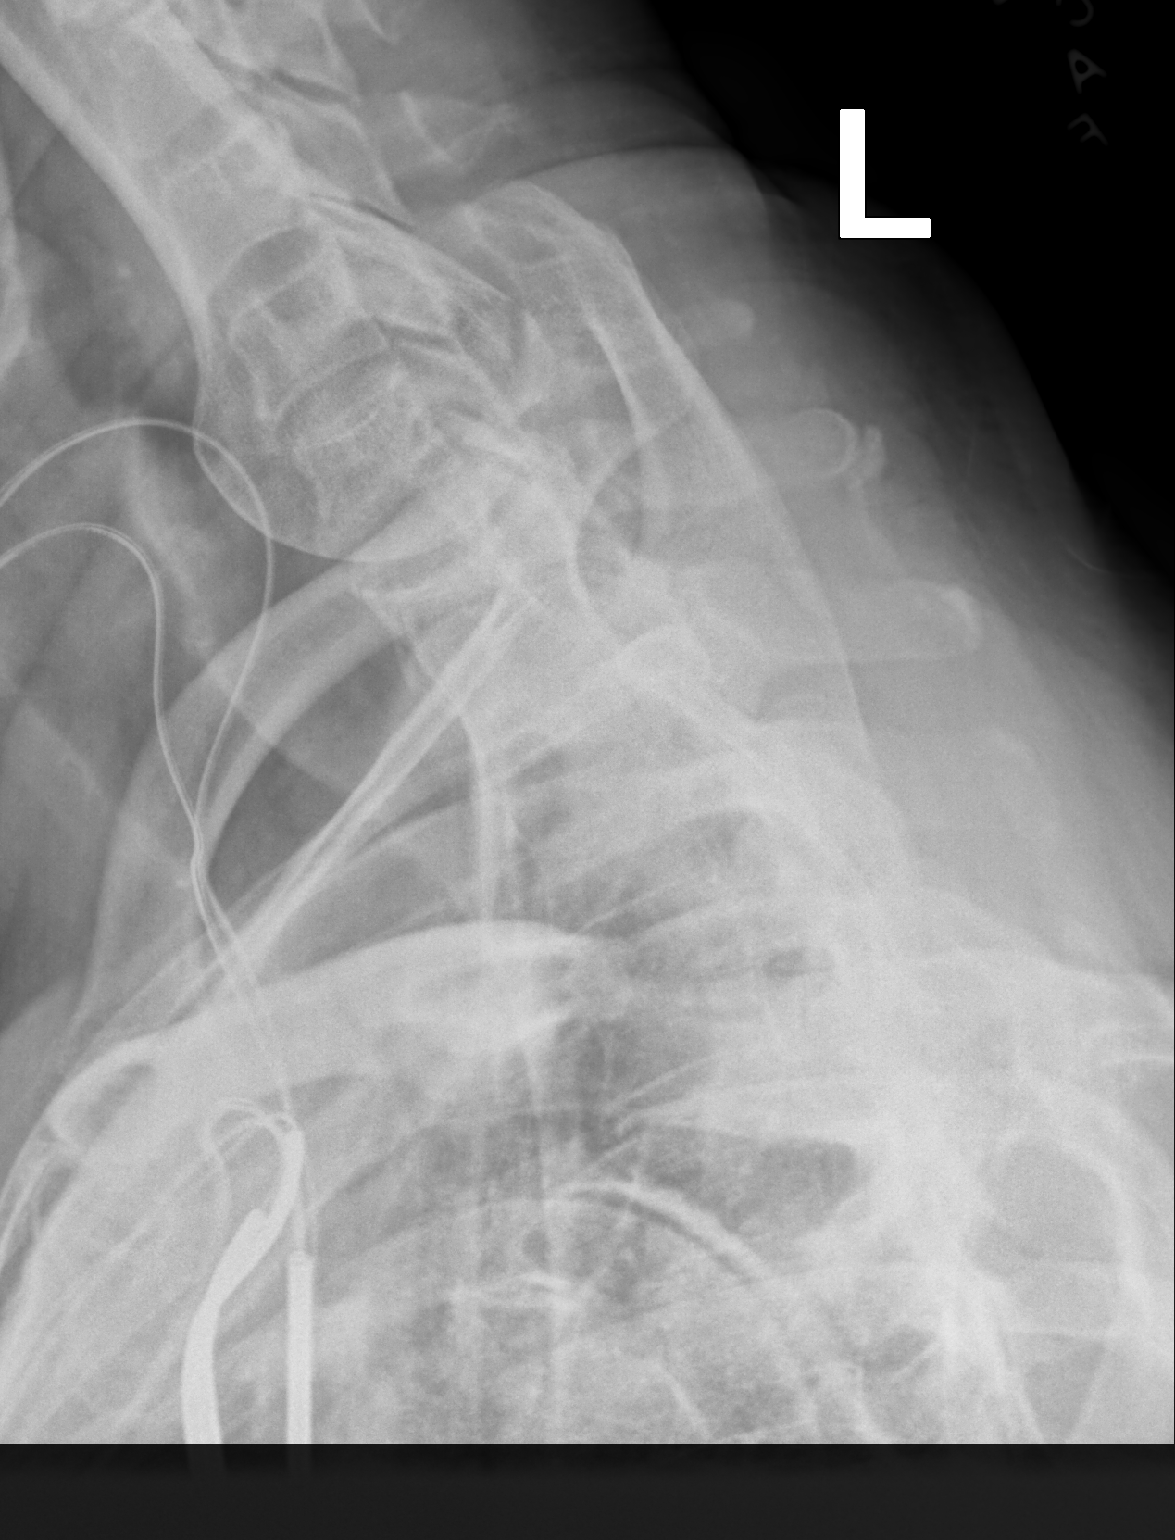

[3 of 3 positions shown; findings below may reference images not displayed]

FINDINGS: Bilateral subclavian pacer and AICD leads. Mild anterior spur
formation at multiple levels. Lower cervical spine degenerative
changes. No fractures or subluxations.
IMPRESSION: Multilevel degenerative changes.  No acute abnormality.

## 2019-03-28 IMAGING — DX DG SACRUM/COCCYX 2+V
3 series · 3 of 3 positions shown · non-contrast
Comparison: None.

CLINICAL DATA: Chronic low back and sacrococcygeal pain. No known
injury.

EXAM:
SACRUM AND COCCYX - 2+ VIEW

[sacrum 20° caudo-cranial ap]
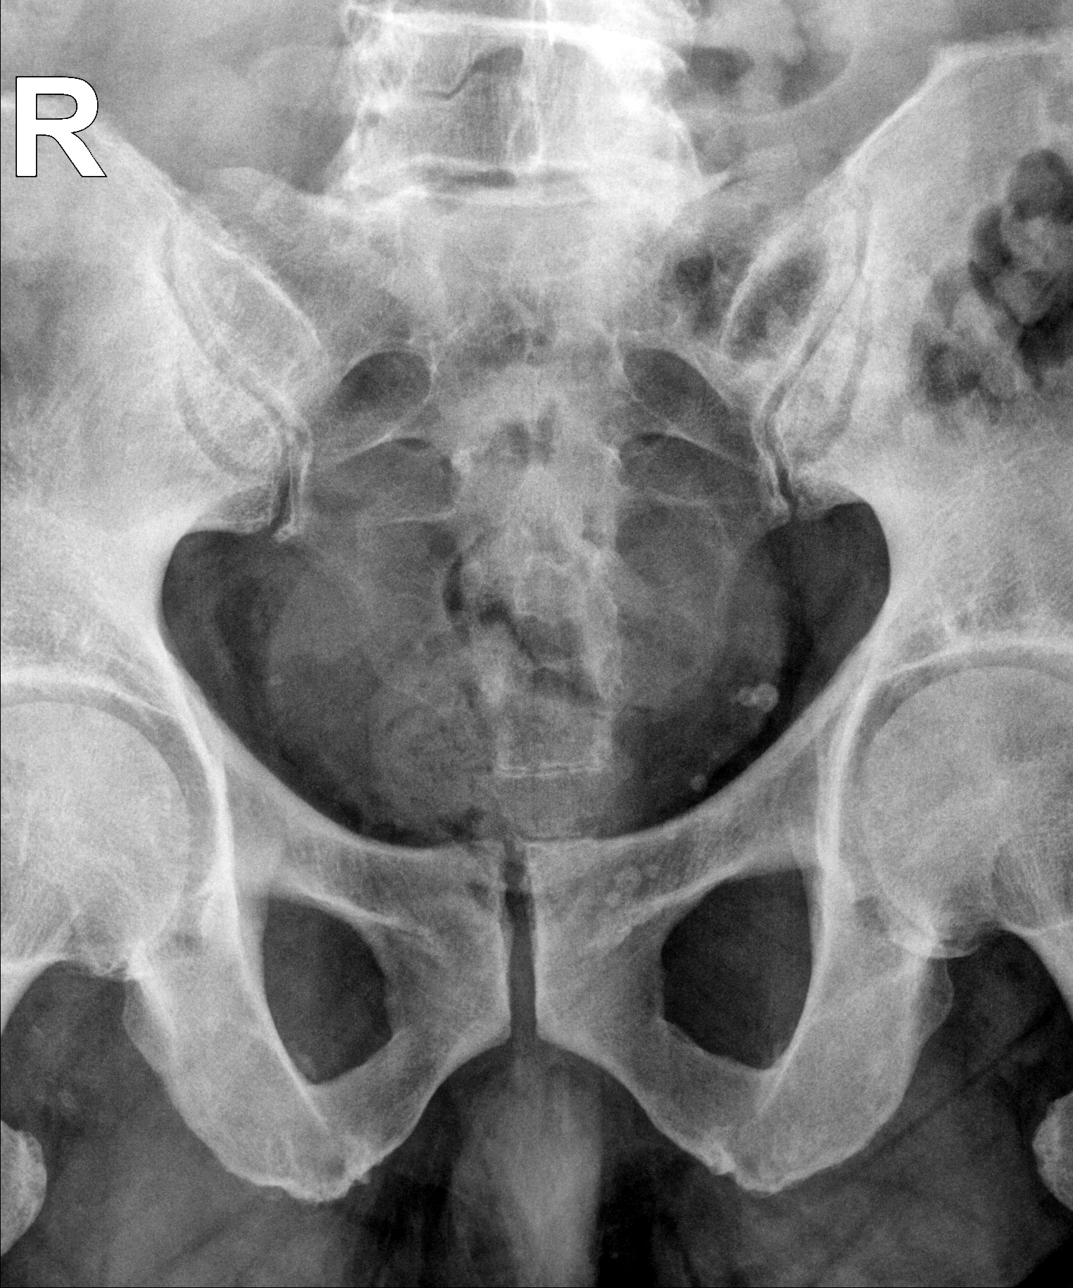

[coccyx ap]
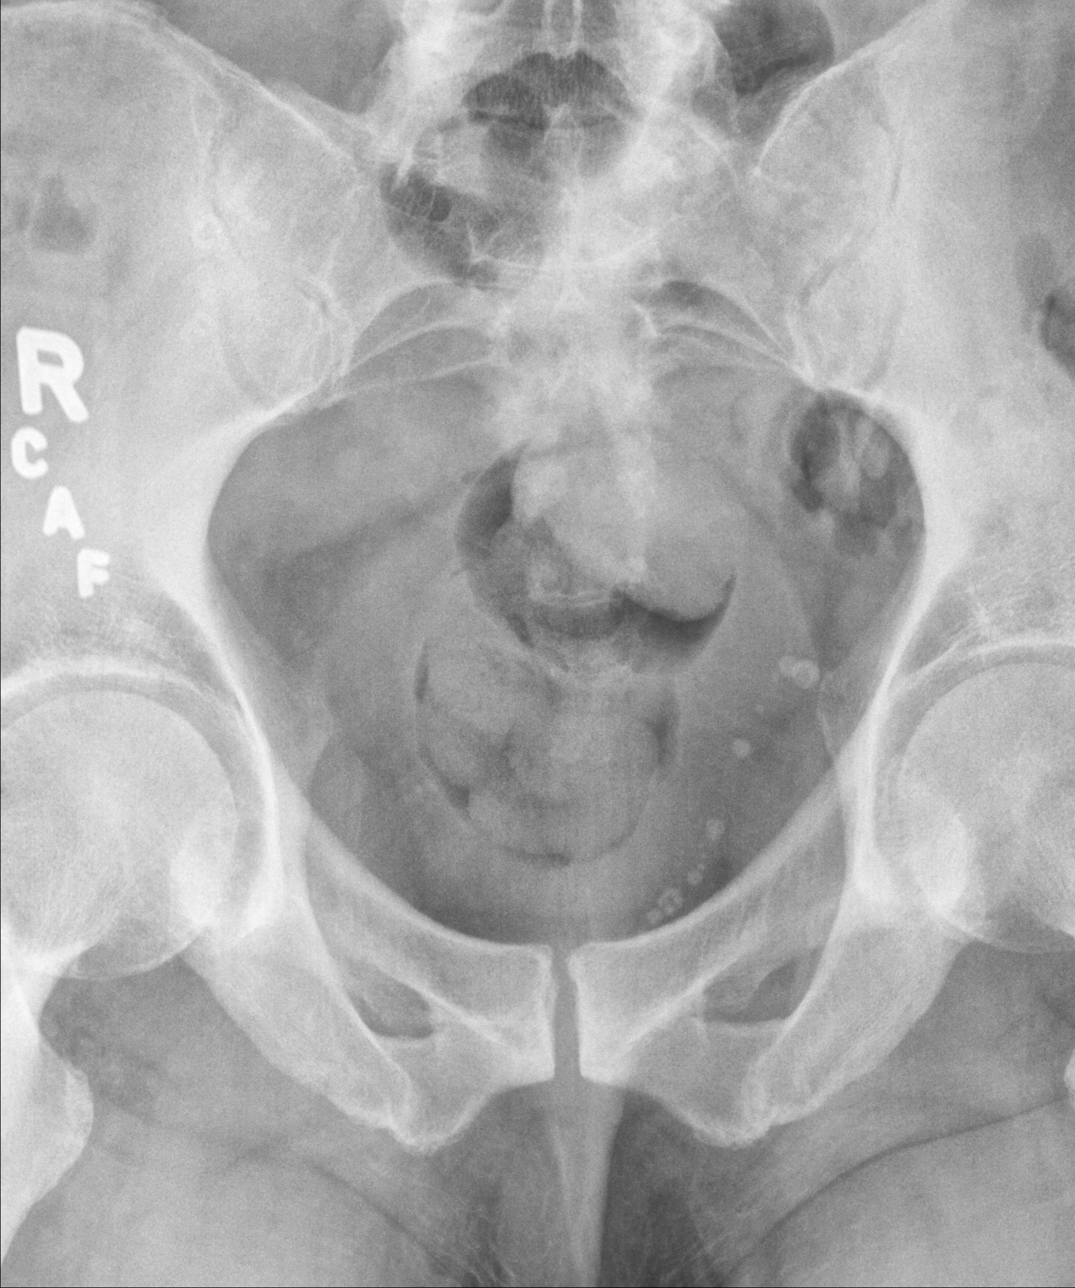

[sacrum lat]
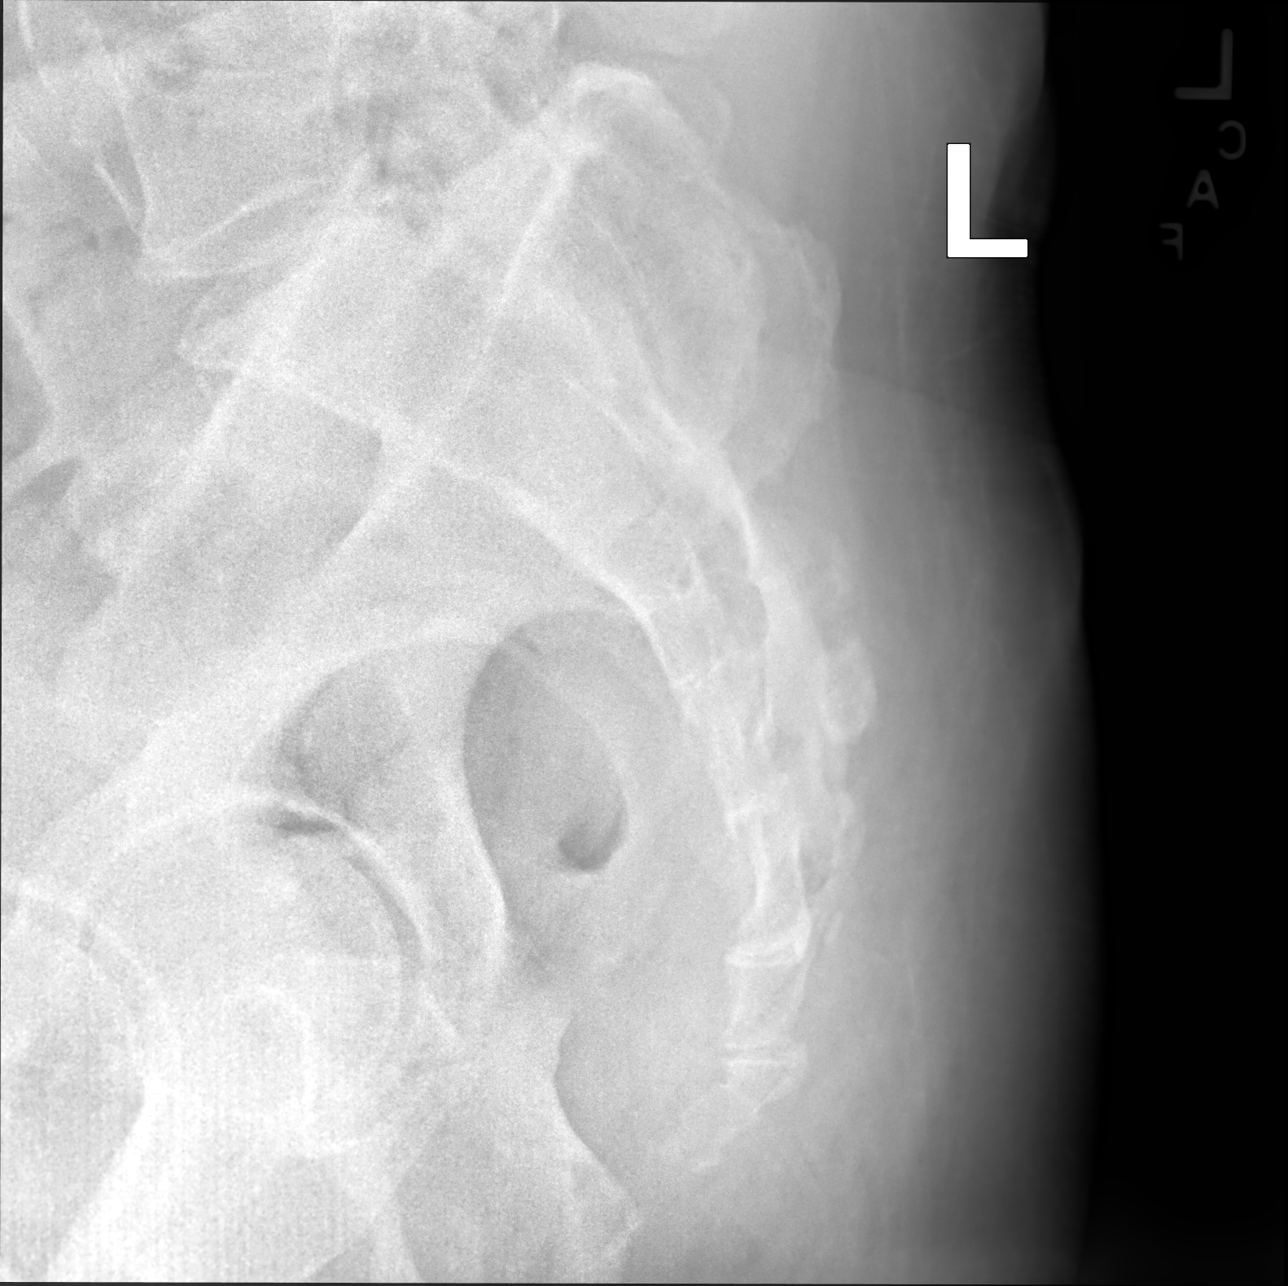

[3 of 3 positions shown; findings below may reference images not displayed]

FINDINGS: Normal appearing sacrum and coccyx. Atheromatous arterial
calcifications.
IMPRESSION: Normal appearing sacrum and coccyx.

## 2019-03-31 ENCOUNTER — Telehealth: Payer: Self-pay | Admitting: Family Medicine

## 2019-03-31 NOTE — Telephone Encounter (Signed)
I don't see anything in this encounter.

## 2019-03-31 NOTE — Telephone Encounter (Signed)
Sorry did not mean to send this.

## 2019-04-05 ENCOUNTER — Ambulatory Visit: Payer: Medicare Other | Admitting: Family Medicine

## 2019-04-17 DIAGNOSIS — I442 Atrioventricular block, complete: Secondary | ICD-10-CM | POA: Diagnosis not present

## 2019-04-20 DIAGNOSIS — Z872 Personal history of diseases of the skin and subcutaneous tissue: Secondary | ICD-10-CM | POA: Diagnosis not present

## 2019-04-20 DIAGNOSIS — L570 Actinic keratosis: Secondary | ICD-10-CM | POA: Diagnosis not present

## 2019-04-20 DIAGNOSIS — L821 Other seborrheic keratosis: Secondary | ICD-10-CM | POA: Diagnosis not present

## 2019-05-08 DIAGNOSIS — N183 Chronic kidney disease, stage 3 (moderate): Secondary | ICD-10-CM | POA: Diagnosis not present

## 2019-05-16 NOTE — Progress Notes (Addendum)
[name] is a 78 y.o. male is here for follow up.  History of Present Illness:   I acted as a Education administrator for [employer], DO [employer], LPN  HPI:   1. Medicare annual wellness visit, subsequent   2. Hypertension associated with diabetes ([place])   3. Morbid obesity ([place])   4. 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 Hyperlipidemia associated with type 2 diabetes mellitus ([place])   6. Type 2 diabetes mellitus with stage 2 chronic kidney disease, with long-term current use of insulin ([place])   7. Chronic combined systolic and diastolic congestive heart failure ([place])    Doing well. Compliant with all medications. No side-effects. Eating well. Trying to exercise regularly. ROS negative - see AMW note.   Health Maintenance Due  Topic Date Due  . URINE MICROALBUMIN  11/12/2018   Depression screen [facility] 2/9 05/17/2019 12/06/2018 02/17/2018  Decreased Interest 0 0 0  Down, Depressed, Hopeless 0 0 0  PHQ - 2 Score 0 0 0  Altered sleeping 0 0 -  Tired, decreased energy 0 0 -  Change in appetite 0 0 -  Feeling bad or failure about yourself  0 0 -  Trouble concentrating 0 0 -  Moving slowly or fidgety/restless 0 0 -  Suicidal thoughts 0 0 -  PHQ-9 Score 0 0 -  Difficult doing work/chores Not difficult at all Not difficult at all -   PMHx, SurgHx, SocialHx, FamHx, Medications, and Allergies were reviewed in the Visit Navigator and updated as appropriate.   Patient Active Problem List   Diagnosis Date Noted  . Pain in joint, pelvic region and thigh 05/17/2019  . Localized osteoarthrosis, forearm 05/17/2019  . Degeneration of lumbar or lumbosacral intervertebral disc 05/17/2019  . Stage 2 chronic kidney disease 05/17/2019  . Actinic keratosis 10/14/2018  . Onychomycosis 10/14/2018  . Morbid obesity ([place]) 12/25/2017  . Thrombocytopenia ([place]) 11/15/2017  . Allergic rhinitis 09/28/2017  . Cardiac defibrillator in place 09/28/2017  . Congestive heart failure ([place])  09/28/2017  . CAD (coronary artery disease), native coronary artery 09/28/2017  . Hyperlipidemia associated with type 2 diabetes mellitus ([place]) 09/28/2017  . GERD (gastroesophageal reflux disease) 09/28/2017  . Mitral valve incompetence 09/28/2017  . 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 History of actinic keratosis 09/12/2015  . 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 Enthesopathy of hip region 01/04/2015  . DM (diabetes mellitus), type 2 ([place]) 09/04/2013  . Hypertension associated with diabetes ([place]) 09/04/2013  . 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 Abnormal nuclear stress test 08/26/2011  . Abnormal echocardiogram 08/26/2011  . Systolic and diastolic CHF, chronic ([place]) 08/26/2011   Social History   Tobacco Use  . Smoking status: Former Research scientist (life sciences)  . Smokeless tobacco: Never Used  . Tobacco comment: remote at 20;  Substance Use Topics  . Alcohol use: No    Frequency: Never  . Drug use: No   Current Medications and Allergies   Current Outpatient Medications:  .  aspirin 325 MG tablet, Take 325 mg by mouth., Disp: , Rfl:  .  Azelastine HCl 0.15 % SOLN, Place 1 spray into both nostrils daily., Disp: 90 mL, Rfl: 0 .  Blood Glucose Monitoring Suppl (FREESTYLE FREEDOM LITE) w/Device KIT, USE TO CHECK BLOOD SUGAR AS DIRECTED, Disp: 1 each, Rfl: 0 .  carvedilol (COREG) 25 MG tablet, Take 1 tablet (25 mg total) by mouth 2 (two)  times daily., Disp: 180 tablet, Rfl: 3 .  digoxin (LANOXIN) 0.125 MG tablet, TAKE 1 TABLET DAILY (Patient taking differently: Three times a week), Disp: 90 tablet, Rfl: 1 .  ezetimibe-simvastatin (VYTORIN) 10-40 MG tablet, TAKE 1 TABLET DAILY, Disp: 90 tablet, Rfl: 4 .  fluticasone (FLONASE) 50 MCG/ACT nasal spray, Place 1 spray into both nostrils 2 (two) times daily as needed for allergies or rhinitis., Disp: 48 g, Rfl: 1 .  furosemide (LASIX) 40 MG tablet, TAKE 1 TABLET TWICE A DAY, Disp: 180 tablet, Rfl: 4 .  glucose  blood test strip, Use as instructed, Disp: 300 each, Rfl: 3 .  Insulin Glargine (LANTUS SOLOSTAR) 100 UNIT/ML Solostar Pen, INJECT 78 UNITS UNDER THE SKIN DAILY (Patient taking differently: INJECT 75-76  UNITS UNDER THE SKIN DAILY), Disp: 45 mL, Rfl: 4 .  isosorbide mononitrate (IMDUR) 30 MG 24 hr tablet, TAKE 1 TABLET DAILY, Disp: 90 tablet, Rfl: 1 .  Lancets (FREESTYLE) lancets, USE AS INSTRUCTED, Disp: 400 each, Rfl: 4 .  nitroGLYCERIN (NITROSTAT) 0.4 MG SL tablet, Place 0.4 mg under the tongue., Disp: , Rfl:  .  NOVOLOG FLEXPEN 100 UNIT/ML FlexPen, INJECT 30 UNITS UNDER THE SKIN THREE TIMES A DAY WITH MEALS, Disp: 105 mL, Rfl: 3 .  pantoprazole (PROTONIX) 40 MG tablet, Take 1 tablet (40 mg total) by mouth 2 (two) times daily., Disp: 60 tablet, Rfl: 3 .  potassium chloride SA (K-DUR,KLOR-CON) 20 MEQ tablet, TAKE 1 TABLET DAILY WITH LASIX, Disp: 90 tablet, Rfl: 4 .  sacubitril-valsartan (ENTRESTO) 49-51 MG, Take 1 tablet by mouth 2 (two) times daily., Disp: , Rfl:  .  SURE COMFORT PEN NEEDLES 31G X 5 MM MISC, Inject 1 Device into the skin 3 (three) times daily., Disp: 360 each, Rfl: 3  No Known Allergies Review of Systems   Pertinent items are noted in the HPI. Otherwise, a complete ROS is negative.  Vitals   Vitals:   05/17/19 0821  BP: 106/60  Pulse: (!) 59  Temp: 98.5 F (36.9 C)  TempSrc: Oral  SpO2: 96%  Weight: 244 lb 4 oz (110.8 kg)  Height: 6' (1.829 m)     Body mass index is 33.13 kg/m.  Physical Exam   Physical Exam Vitals signs and nursing note reviewed.  Constitutional:      General: He is not in acute distress.    Appearance: He is well-developed.  HENT:     Head: Normocephalic and atraumatic.     Right Ear: External ear normal.     Left Ear: External ear normal.     Nose: Nose normal.  Eyes:     Conjunctiva/sclera: Conjunctivae normal.     Pupils: Pupils are equal, round, and reactive to light.  Neck:     Musculoskeletal: Neck supple.  Cardiovascular:       Rate and Rhythm: Normal rate and regular rhythm.  Pulmonary:     Effort: Pulmonary effort is normal.  Abdominal:     General: Bowel sounds are normal.     Palpations: Abdomen is soft.  Musculoskeletal: Normal range of motion.  Skin:    General: Skin is warm.  Neurological:     Mental Status: He is alert.  Psychiatric:        Behavior: Behavior normal.    Diabetic Foot Exam - Simple   Simple Foot Form Visual Inspection See comments:  Yes Sensation Testing Intact to touch and monofilament testing bilaterally:  Yes Pulse Check Posterior Tibialis and Dorsalis pulse intact bilaterally:  Yes Comments Right great toe has a scrape on it, healing. Otherwise no deformities or ulcerations     Assessment and Plan   Efraim was seen today for medicare wellness.  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 Orders and follow up as documented in [place], reviewed diet, exercise and weight control, cardiovascular risk and specific lipid/LDL goals reviewed, reviewed medications and side effects in detail.  . Reviewed expectations re: course of current medical issues. . Outlined signs and symptoms indicating need for more acute intervention. . Patient verbalized understanding and all questions were answered. . Patient received an After Visit Summary.  CMA/LPN served as  Education administrator during this visit. History, Physical, and Plan performed by medical provider. The above documentation has been reviewed and is accurate and complete. [name] Deutscher, D.O.  [name] Deutscher, DO [place], [facility] 05/17/2019

## 2019-05-17 ENCOUNTER — Ambulatory Visit (INDEPENDENT_AMBULATORY_CARE_PROVIDER_SITE_OTHER): Payer: Medicare Other | Admitting: Family Medicine

## 2019-05-17 ENCOUNTER — Other Ambulatory Visit: Payer: Self-pay

## 2019-05-17 ENCOUNTER — Encounter: Payer: Self-pay | Admitting: Family Medicine

## 2019-05-17 VITALS — BP 106/60 | HR 59 | Temp 98.5°F | Ht 72.0 in | Wt 244.2 lb

## 2019-05-17 DIAGNOSIS — I10 Essential (primary) hypertension: Secondary | ICD-10-CM | POA: Diagnosis not present

## 2019-05-17 DIAGNOSIS — N182 Chronic kidney disease, stage 2 (mild): Secondary | ICD-10-CM

## 2019-05-17 DIAGNOSIS — Z794 Long term (current) use of insulin: Secondary | ICD-10-CM | POA: Diagnosis not present

## 2019-05-17 DIAGNOSIS — Z0000 Encounter for general adult medical examination without abnormal findings: Secondary | ICD-10-CM

## 2019-05-17 DIAGNOSIS — E1122 Type 2 diabetes mellitus with diabetic chronic kidney disease: Secondary | ICD-10-CM | POA: Diagnosis not present

## 2019-05-17 DIAGNOSIS — M19039 Primary osteoarthritis, unspecified wrist: Secondary | ICD-10-CM | POA: Insufficient documentation

## 2019-05-17 DIAGNOSIS — I129 Hypertensive chronic kidney disease with stage 1 through stage 4 chronic kidney disease, or unspecified chronic kidney disease: Secondary | ICD-10-CM | POA: Diagnosis not present

## 2019-05-17 DIAGNOSIS — M25559 Pain in unspecified hip: Secondary | ICD-10-CM | POA: Insufficient documentation

## 2019-05-17 DIAGNOSIS — M51379 Other intervertebral disc degeneration, lumbosacral region without mention of lumbar back pain or lower extremity pain: Secondary | ICD-10-CM | POA: Insufficient documentation

## 2019-05-17 DIAGNOSIS — E1159 Type 2 diabetes mellitus with other circulatory complications: Secondary | ICD-10-CM | POA: Diagnosis not present

## 2019-05-17 DIAGNOSIS — E785 Hyperlipidemia, unspecified: Secondary | ICD-10-CM | POA: Diagnosis not present

## 2019-05-17 DIAGNOSIS — I5042 Chronic combined systolic (congestive) and diastolic (congestive) heart failure: Secondary | ICD-10-CM | POA: Diagnosis not present

## 2019-05-17 DIAGNOSIS — I152 Hypertension secondary to endocrine disorders: Secondary | ICD-10-CM

## 2019-05-17 DIAGNOSIS — M5137 Other intervertebral disc degeneration, lumbosacral region: Secondary | ICD-10-CM | POA: Insufficient documentation

## 2019-05-17 DIAGNOSIS — E1169 Type 2 diabetes mellitus with other specified complication: Secondary | ICD-10-CM

## 2019-05-17 LAB — CBC WITH DIFFERENTIAL/PLATELET
Basophils Absolute: 0 10*3/uL (ref 0.0–0.1)
Basophils Relative: 0.4 % (ref 0.0–3.0)
Eosinophils Absolute: 0.1 10*3/uL (ref 0.0–0.7)
Eosinophils Relative: 1.2 % (ref 0.0–5.0)
HCT: 40.8 % (ref 39.0–52.0)
Hemoglobin: 13.8 g/dL (ref 13.0–17.0)
Lymphocytes Relative: 22 % (ref 12.0–46.0)
Lymphs Abs: 1.5 10*3/uL (ref 0.7–4.0)
MCHC: 33.9 g/dL (ref 30.0–36.0)
MCV: 85.8 fl (ref 78.0–100.0)
Monocytes Absolute: 0.9 10*3/uL (ref 0.1–1.0)
Monocytes Relative: 12.9 % — ABNORMAL HIGH (ref 3.0–12.0)
Neutro Abs: 4.3 10*3/uL (ref 1.4–7.7)
Neutrophils Relative %: 63.5 % (ref 43.0–77.0)
Platelets: 123 10*3/uL — ABNORMAL LOW (ref 150.0–400.0)
RBC: 4.76 Mil/uL (ref 4.22–5.81)
RDW: 13.9 % (ref 11.5–15.5)
WBC: 6.8 10*3/uL (ref 4.0–10.5)

## 2019-05-17 LAB — MICROALBUMIN / CREATININE URINE RATIO
Creatinine,U: 49.1 mg/dL
Microalb Creat Ratio: 1.4 mg/g (ref 0.0–30.0)
Microalb, Ur: <0.7 mg/dL (ref 0.0–1.9)

## 2019-05-17 LAB — LIPID PANEL
Cholesterol: 96 mg/dL (ref 0–200)
HDL: 33.1 mg/dL — ABNORMAL LOW (ref >39.00)
LDL Cholesterol: 43 mg/dL (ref 0–99)
NonHDL: 62.86
Total CHOL/HDL Ratio: 3
Triglycerides: 101 mg/dL (ref 0.0–149.0)
VLDL: 20.2 mg/dL (ref 0.0–40.0)

## 2019-05-17 LAB — COMPREHENSIVE METABOLIC PANEL
ALT: 10 U/L (ref 0–53)
AST: 14 U/L (ref 0–37)
Albumin: 3.9 g/dL (ref 3.5–5.2)
Alkaline Phosphatase: 77 U/L (ref 39–117)
BUN: 28 mg/dL — ABNORMAL HIGH (ref 6–23)
CO2: 28 mEq/L (ref 19–32)
Calcium: 9.1 mg/dL (ref 8.4–10.5)
Chloride: 103 mEq/L (ref 96–112)
Creatinine, Ser: 1.66 mg/dL — ABNORMAL HIGH (ref 0.40–1.50)
GFR: 40.29 mL/min — ABNORMAL LOW (ref >60.00)
Glucose, Bld: 113 mg/dL — ABNORMAL HIGH (ref 70–99)
Potassium: 3.9 mEq/L (ref 3.5–5.1)
Sodium: 140 mEq/L (ref 135–145)
Total Bilirubin: 0.7 mg/dL (ref 0.2–1.2)
Total Protein: 6.2 g/dL (ref 6.0–8.3)

## 2019-05-17 LAB — TSH: TSH: 2.25 u[IU]/mL (ref 0.35–4.50)

## 2019-05-17 LAB — HEMOGLOBIN A1C: Hgb A1c MFr Bld: 7.4 % — ABNORMAL HIGH (ref 4.6–6.5)

## 2019-05-17 NOTE — Progress Notes (Signed)
Subjective:    [name] is a 78 y.o. male who presents for Medicare Annual (Subsequent) preventive examination.  Review of Systems  Constitutional: Negative for chills, fever, malaise/fatigue and weight loss.  Respiratory: Negative for cough, shortness of breath and wheezing.   Cardiovascular: Negative for chest pain, palpitations and leg swelling.  Gastrointestinal: Negative for abdominal pain, constipation, diarrhea, nausea and vomiting.  Genitourinary: Negative for dysuria and urgency.  Musculoskeletal: Negative for joint pain and myalgias.  Skin: Negative for rash.  Neurological: Negative for dizziness and headaches.  Psychiatric/Behavioral: Negative for depression, substance abuse and suicidal ideas. The patient is not nervous/anxious.    Objective:   Vitals: BP 106/60 (BP Location: Left Arm, Patient Position: Sitting, Cuff Size: Large)   Pulse (!) 59   Temp 98.5 F (36.9 C) (Oral)   Ht 6' (1.829 m)   Wt 244 lb 4 oz (110.8 kg)   SpO2 96%   BMI 33.13 kg/m   Body mass index is 33.13 kg/m.  Social History   Tobacco Use  Smoking Status Former Smoker  Smokeless Tobacco Never Used  Tobacco Comment   remote at 60;     Patient Active Problem List   Diagnosis Date Noted  . Pain in joint, pelvic region and thigh 05/17/2019  . Localized osteoarthrosis, forearm 05/17/2019  . Degeneration of lumbar or lumbosacral intervertebral disc 05/17/2019  . Stage 2 chronic kidney disease 05/17/2019  . Actinic keratosis 10/14/2018  . Onychomycosis 10/14/2018  . Morbid obesity ([place]) 12/25/2017  . Thrombocytopenia ([place]) 11/15/2017  . Allergic rhinitis 09/28/2017  . Cardiac defibrillator in place 09/28/2017  . Congestive heart failure ([place]) 09/28/2017  . CAD (coronary artery disease), native coronary artery 09/28/2017  . Hyperlipidemia associated with type 2 diabetes mellitus ([place]) 09/28/2017  . GERD (gastroesophageal reflux disease) 09/28/2017  . Mitral valve incompetence  09/28/2017  . 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 History of actinic keratosis 09/12/2015  . 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 Enthesopathy of hip region 01/04/2015  . DM (diabetes mellitus), type 2 ([place]) 09/04/2013  . Hypertension associated with diabetes ([place]) 09/04/2013  . 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 Abnormal nuclear stress test 08/26/2011  . Abnormal echocardiogram 08/26/2011  . Systolic and diastolic CHF, chronic ([place]) 08/26/2011   Past Surgical History:  Procedure Laterality Date  . TONSILLECTOMY AND ADENOIDECTOMY Bilateral 1950   History reviewed. No pertinent family history. Social History   Socioeconomic History  . Marital status: Married    Spouse name: Not on file  . Number of children: Not on file  . Years of education: Not on file  . Highest education level: Not on file  Occupational History  . Not on file  Social Needs  . Financial resource strain: Not on file  . Food insecurity:    Worry: Not on file    Inability: Not on file  . Transportation needs:    Medical: Not on file    Non-medical: Not on file  Tobacco Use  . Smoking status: Former Research scientist (life sciences)  . Smokeless tobacco: Never Used  . Tobacco comment: remote at 20;  Substance and Sexual Activity  . Alcohol use: No    Frequency: Never  . Drug use: No  . Sexual activity: Yes  Lifestyle  . Physical activity:    Days per week: Not on file    Minutes per session: Not on file  . Stress: Not on file  Relationships  .  Social connections:    Talks on phone: Not on file    Gets together: Not on file    Attends religious service: Not on file    Active member of club or organization: Not on file    Attends meetings of clubs or organizations: Not on file    Relationship status: Not on file  Other Topics Concern  . Not on file  Social History Narrative  . Not on file    Current Outpatient Medications:  .  aspirin 325 MG tablet, Take 325 mg by  mouth., Disp: , Rfl:  .  Azelastine HCl 0.15 % SOLN, Place 1 spray into both nostrils daily., Disp: 90 mL, Rfl: 0 .  Blood Glucose Monitoring Suppl (FREESTYLE FREEDOM LITE) w/Device KIT, USE TO CHECK BLOOD SUGAR AS DIRECTED, Disp: 1 each, Rfl: 0 .  carvedilol (COREG) 25 MG tablet, Take 1 tablet (25 mg total) by mouth 2 (two) times daily., Disp: 180 tablet, Rfl: 3 .  digoxin (LANOXIN) 0.125 MG tablet, TAKE 1 TABLET DAILY (Patient taking differently: Three times a week), Disp: 90 tablet, Rfl: 1 .  ezetimibe-simvastatin (VYTORIN) 10-40 MG tablet, TAKE 1 TABLET DAILY, Disp: 90 tablet, Rfl: 4 .  fluticasone (FLONASE) 50 MCG/ACT nasal spray, Place 1 spray into both nostrils 2 (two) times daily as needed for allergies or rhinitis., Disp: 48 g, Rfl: 1 .  furosemide (LASIX) 40 MG tablet, TAKE 1 TABLET TWICE A DAY, Disp: 180 tablet, Rfl: 4 .  glucose blood test strip, Use as instructed, Disp: 300 each, Rfl: 3 .  Insulin Glargine (LANTUS SOLOSTAR) 100 UNIT/ML Solostar Pen, INJECT 78 UNITS UNDER THE SKIN DAILY (Patient taking differently: INJECT 75-76  UNITS UNDER THE SKIN DAILY), Disp: 45 mL, Rfl: 4 .  isosorbide mononitrate (IMDUR) 30 MG 24 hr tablet, TAKE 1 TABLET DAILY, Disp: 90 tablet, Rfl: 1 .  Lancets (FREESTYLE) lancets, USE AS INSTRUCTED, Disp: 400 each, Rfl: 4 .  nitroGLYCERIN (NITROSTAT) 0.4 MG SL tablet, Place 0.4 mg under the tongue., Disp: , Rfl:  .  NOVOLOG FLEXPEN 100 UNIT/ML FlexPen, INJECT 30 UNITS UNDER THE SKIN THREE TIMES A DAY WITH MEALS, Disp: 105 mL, Rfl: 3 .  pantoprazole (PROTONIX) 40 MG tablet, Take 1 tablet (40 mg total) by mouth 2 (two) times daily., Disp: 60 tablet, Rfl: 3 .  potassium chloride SA (K-DUR,KLOR-CON) 20 MEQ tablet, TAKE 1 TABLET DAILY WITH LASIX, Disp: 90 tablet, Rfl: 4 .  sacubitril-valsartan (ENTRESTO) 49-51 MG, Take 1 tablet by mouth 2 (two) times daily., Disp: , Rfl:  .  SURE COMFORT PEN NEEDLES 31G X 5 MM MISC, Inject 1 Device into the skin 3 (three) times  daily., Disp: 360 each, Rfl: 3  Activities of Daily Living In your present state of health, do you have any difficulty performing the following activities: 05/17/2019  Hearing? N  Vision? N  Difficulty concentrating or making decisions? N  Walking or climbing stairs? N  Dressing or bathing? N  Doing errands, shopping? N  Some recent data might be hidden   Patient Care Team: [name] Deutscher, DO as PCP - General (Family Medicine) Bonney Aid, MD as Referring Physician (Cardiology) Regenia Skeeter, MD as Referring Physician (Nephrology) Crosby Oyster, MD as Referring Physician (Dermatology) Assessment:  This is a routine wellness examination for Janoah.  Exercise Activities and Dietary recommendations Current Exercise Habits: Home exercise routine, Type of exercise: walking, Time (Minutes): 10, Frequency (Times/Week): 1, Weekly Exercise (Minutes/Week): 10, Intensity: Mild, Exercise limited  by: cardiac condition(s)  Fall Risk Fall Risk  05/17/2019 12/06/2018 02/17/2018 09/28/2017  Falls in the past year? 0 0 No No  Number falls in past yr: - 0 - -  Injury with Fall? - 0 - -  Follow up Falls evaluation completed - - -   Is the patient's home free of loose throw rugs in walkways, pet beds, electrical cords, etc?   yes. Grab bars in the bathroom? no. Handrails on the stairs?   yes. Adequate lighting? yes.  Timed Get Up and Go performed: yes, taking < 12 seconds Note: If takes > 12 seconds to complete, patient at increased risk of falling. Note if patient has slow pace, loss of balance, short strides, little or no arm swings, steadying self on walls, shuffling, not using assist device properly.   Depression Screen PHQ 2/9 Scores 05/17/2019 12/06/2018 02/17/2018 09/28/2017  PHQ - 2 Score 0 0 0 0  PHQ- 9 Score 0 0 - -    Cognitive Function 6CIT Screen 05/17/2019  What Year? 0 points  What month? 0 points  What time? 0 points  Count back from 20 0 points  Months in  reverse 0 points  Repeat phrase 0 points  Total Score 0   Immunization History  Administered Date(s) Administered  . Influenza Split 10/11/2007, 08/14/2010, 09/08/2011  . Influenza, High Dose Seasonal PF 10/11/2007, 08/14/2010, 09/08/2011, 10/24/2015, 11/06/2016, 09/28/2017, 08/23/2018  . Influenza, Seasonal, Injecte, Preservative Fre 12/18/2013  . Influenza,inj,Quad PF,6+ Mos 12/18/2013  . Pneumococcal Conjugate-13 04/26/2015  . Pneumococcal Polysaccharide-23 04/06/2012  . Td 04/22/2006, 04/22/2006  . Tdap 04/26/2015  . Zoster 05/27/2015   Qualifies for Shingles Vaccine? Yes Note: Contraindications include severe allergic reaction (e.g., anaphylaxis) after a previous dose or to a vaccine component, known severe immunodeficiency (e.g., from hematologic and solid tumors, receipt of chemotherapy, congenital immunodeficiency,  long-term immunosuppressive therapy(g) or patients with HIV infection who are severely immunocompromised), and pregnancy.  Screening Tests Health Maintenance  Topic Date Due  . URINE MICROALBUMIN  11/12/2018  . FOOT EXAM  12/24/2018  . OPHTHALMOLOGY EXAM  08/17/2019 (Originally 10/07/2018)  . HEMOGLOBIN A1C  06/06/2019  . INFLUENZA VACCINE  07/08/2019  . TETANUS/TDAP  04/25/2025  . PNA vac Low Risk Adult  Completed   Cancer Screenings: Lung: Low Dose CT Chest recommended if Age 46-80 years, 30 pack-year currently smoking OR have quit w/in 15years. Patient does not qualify.  Plan:   I have personally reviewed and noted the following in the patient's chart:   . Medical and social history . Use of alcohol, tobacco or illicit drugs  . Current medications and supplements . Functional ability and status . Nutritional status . Physical activity . Advanced directives . List of other physicians . Hospitalizations, surgeries, and ER visits in previous 12 months . Vitals . Screenings to include cognitive, depression, and falls . Referrals and appointments  In  addition, I have reviewed and discussed with patient certain preventive protocols, quality metrics, and best practice recommendations. A written personalized care plan for preventive services as well as general preventive health recommendations were provided to patient. [facility] QUESTIONS ANSWERED AND SCANNED INTO CHART UNDER MEDIA SECTION.  [name] Deutscher, DO  05/17/2019

## 2019-05-24 DIAGNOSIS — I255 Ischemic cardiomyopathy: Secondary | ICD-10-CM | POA: Diagnosis not present

## 2019-05-24 DIAGNOSIS — Z79899 Other long term (current) drug therapy: Secondary | ICD-10-CM | POA: Diagnosis not present

## 2019-05-30 ENCOUNTER — Encounter: Payer: Self-pay | Admitting: Family Medicine

## 2019-06-07 DIAGNOSIS — I340 Nonrheumatic mitral (valve) insufficiency: Secondary | ICD-10-CM | POA: Diagnosis not present

## 2019-06-07 DIAGNOSIS — Z95810 Presence of automatic (implantable) cardiac defibrillator: Secondary | ICD-10-CM | POA: Diagnosis not present

## 2019-06-07 DIAGNOSIS — I959 Hypotension, unspecified: Secondary | ICD-10-CM | POA: Diagnosis not present

## 2019-06-07 DIAGNOSIS — I255 Ischemic cardiomyopathy: Secondary | ICD-10-CM | POA: Diagnosis not present

## 2019-06-25 ENCOUNTER — Other Ambulatory Visit: Payer: Self-pay | Admitting: Family Medicine

## 2019-06-26 ENCOUNTER — Other Ambulatory Visit: Payer: Self-pay | Admitting: Family Medicine

## 2019-07-28 DIAGNOSIS — I429 Cardiomyopathy, unspecified: Secondary | ICD-10-CM | POA: Diagnosis not present

## 2019-07-28 DIAGNOSIS — I442 Atrioventricular block, complete: Secondary | ICD-10-CM | POA: Diagnosis not present

## 2019-07-28 DIAGNOSIS — I509 Heart failure, unspecified: Secondary | ICD-10-CM | POA: Diagnosis not present

## 2019-08-01 ENCOUNTER — Other Ambulatory Visit: Payer: Self-pay | Admitting: Family Medicine

## 2019-08-04 ENCOUNTER — Other Ambulatory Visit: Payer: Self-pay | Admitting: Family Medicine

## 2019-08-08 NOTE — Telephone Encounter (Signed)
Last fill 07/06/2018  #360/3 Last OV 05/17/19 Next OV 08/10/19

## 2019-08-15 ENCOUNTER — Encounter: Payer: Self-pay | Admitting: Family Medicine

## 2019-08-15 ENCOUNTER — Other Ambulatory Visit: Payer: Self-pay

## 2019-08-15 ENCOUNTER — Ambulatory Visit (INDEPENDENT_AMBULATORY_CARE_PROVIDER_SITE_OTHER): Payer: Medicare Other | Admitting: Family Medicine

## 2019-08-15 VITALS — BP 133/81 | HR 62 | Temp 97.3°F | Ht 72.0 in | Wt 246.4 lb

## 2019-08-15 DIAGNOSIS — I129 Hypertensive chronic kidney disease with stage 1 through stage 4 chronic kidney disease, or unspecified chronic kidney disease: Secondary | ICD-10-CM | POA: Diagnosis not present

## 2019-08-15 DIAGNOSIS — I10 Essential (primary) hypertension: Secondary | ICD-10-CM | POA: Diagnosis not present

## 2019-08-15 DIAGNOSIS — Z794 Long term (current) use of insulin: Secondary | ICD-10-CM | POA: Diagnosis not present

## 2019-08-15 DIAGNOSIS — N182 Chronic kidney disease, stage 2 (mild): Secondary | ICD-10-CM

## 2019-08-15 DIAGNOSIS — E1169 Type 2 diabetes mellitus with other specified complication: Secondary | ICD-10-CM

## 2019-08-15 DIAGNOSIS — Z23 Encounter for immunization: Secondary | ICD-10-CM | POA: Diagnosis not present

## 2019-08-15 DIAGNOSIS — E1159 Type 2 diabetes mellitus with other circulatory complications: Secondary | ICD-10-CM | POA: Diagnosis not present

## 2019-08-15 DIAGNOSIS — E1122 Type 2 diabetes mellitus with diabetic chronic kidney disease: Secondary | ICD-10-CM

## 2019-08-15 DIAGNOSIS — E785 Hyperlipidemia, unspecified: Secondary | ICD-10-CM | POA: Diagnosis not present

## 2019-08-15 DIAGNOSIS — I152 Hypertension secondary to endocrine disorders: Secondary | ICD-10-CM

## 2019-08-15 LAB — CBC WITH DIFFERENTIAL/PLATELET
Basophils Absolute: 0 10*3/uL (ref 0.0–0.1)
Basophils Relative: 0.5 % (ref 0.0–3.0)
Eosinophils Absolute: 0.1 10*3/uL (ref 0.0–0.7)
Eosinophils Relative: 1.1 % (ref 0.0–5.0)
HCT: 41.3 % (ref 39.0–52.0)
Hemoglobin: 14.3 g/dL (ref 13.0–17.0)
Lymphocytes Relative: 22.2 % (ref 12.0–46.0)
Lymphs Abs: 1.7 10*3/uL (ref 0.7–4.0)
MCHC: 34.6 g/dL (ref 30.0–36.0)
MCV: 84.9 fl (ref 78.0–100.0)
Monocytes Absolute: 0.9 10*3/uL (ref 0.1–1.0)
Monocytes Relative: 12.2 % — ABNORMAL HIGH (ref 3.0–12.0)
Neutro Abs: 4.9 10*3/uL (ref 1.4–7.7)
Neutrophils Relative %: 64 % (ref 43.0–77.0)
Platelets: 122 10*3/uL — ABNORMAL LOW (ref 150.0–400.0)
RBC: 4.87 Mil/uL (ref 4.22–5.81)
RDW: 14.2 % (ref 11.5–15.5)
WBC: 7.7 10*3/uL (ref 4.0–10.5)

## 2019-08-15 LAB — COMPREHENSIVE METABOLIC PANEL
ALT: 12 U/L (ref 0–53)
AST: 12 U/L (ref 0–37)
Albumin: 3.9 g/dL (ref 3.5–5.2)
Alkaline Phosphatase: 83 U/L (ref 39–117)
BUN: 24 mg/dL — ABNORMAL HIGH (ref 6–23)
CO2: 29 mEq/L (ref 19–32)
Calcium: 8.9 mg/dL (ref 8.4–10.5)
Chloride: 104 mEq/L (ref 96–112)
Creatinine, Ser: 1.67 mg/dL — ABNORMAL HIGH (ref 0.40–1.50)
GFR: 39.98 mL/min — ABNORMAL LOW (ref >60.00)
Glucose, Bld: 118 mg/dL — ABNORMAL HIGH (ref 70–99)
Potassium: 3.7 mEq/L (ref 3.5–5.1)
Sodium: 142 mEq/L (ref 135–145)
Total Bilirubin: 0.6 mg/dL (ref 0.2–1.2)
Total Protein: 6.4 g/dL (ref 6.0–8.3)

## 2019-08-15 LAB — LIPID PANEL
Cholesterol: 111 mg/dL (ref 0–200)
HDL: 34.6 mg/dL — ABNORMAL LOW (ref >39.00)
LDL Cholesterol: 52 mg/dL (ref 0–99)
NonHDL: 76.3
Total CHOL/HDL Ratio: 3
Triglycerides: 124 mg/dL (ref 0.0–149.0)
VLDL: 24.8 mg/dL (ref 0.0–40.0)

## 2019-08-15 NOTE — Progress Notes (Signed)
[name] is a 78 y.o. male is here for follow up.  History of Present Illness:   HPI: Patient is doing very well.  Continuing to monitor her carbohydrate intake and adjust insulin accordingly.  No new symptoms or concerns today.  Of note, the patient does not believe that COVID is an issue, citing [employer].  He is wearing a mask in the building today.  No chest pain, shortness of breath, edema.  Tolerating medications without side effects.    Health Maintenance Due  Topic Date Due  . INFLUENZA VACCINE  07/08/2019   Depression screen [facility] 2/9 08/15/2019 05/17/2019 12/06/2018  Decreased Interest 0 0 0  Down, Depressed, Hopeless 0 0 0  PHQ - 2 Score 0 0 0  Altered sleeping 0 0 0  Tired, decreased energy 0 0 0  Change in appetite 0 0 0  Feeling bad or failure about yourself  0 0 0  Trouble concentrating 0 0 0  Moving slowly or fidgety/restless 0 0 0  Suicidal thoughts 0 0 0  PHQ-9 Score 0 0 0  Difficult doing work/chores Not difficult at all Not difficult at all Not difficult at all   PMHx, SurgHx, SocialHx, FamHx, Medications, and Allergies were reviewed in the Visit Navigator and updated as appropriate.   Patient Active Problem List   Diagnosis Date Noted  . Pain in joint, pelvic region and thigh 05/17/2019  . Localized osteoarthrosis, forearm 05/17/2019  . Degeneration of lumbar or lumbosacral intervertebral disc 05/17/2019  . Stage 2 chronic kidney disease 05/17/2019  . Actinic keratosis 10/14/2018  . Onychomycosis 10/14/2018  . Morbid obesity ([place]) 12/25/2017  . Thrombocytopenia ([place]) 11/15/2017  . Allergic rhinitis 09/28/2017  . Cardiac defibrillator in place 09/28/2017  . Congestive heart failure ([place]) 09/28/2017  . CAD (coronary artery disease), native coronary artery 09/28/2017  . Hyperlipidemia associated with type 2 diabetes mellitus ([place]) 09/28/2017  . GERD (gastroesophageal reflux disease) 09/28/2017  . Mitral valve incompetence 09/28/2017  . Old myocardial  infarction 09/28/2017  . History of actinic keratosis 09/12/2015  . 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 Enthesopathy of hip region 01/04/2015  . DM (diabetes mellitus), type 2 ([place]) 09/04/2013  . Hypertension associated with diabetes ([place]) 09/04/2013  . 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 Abnormal nuclear stress test 08/26/2011  . Abnormal echocardiogram 08/26/2011  . Systolic and diastolic CHF, chronic ([place]) 08/26/2011   Social History   Tobacco Use  . Smoking status: Former Research scientist (life sciences)  . Smokeless tobacco: Never Used  . Tobacco comment: remote at 20;  Substance Use Topics  . Alcohol use: No    Frequency: Never  . Drug use: No   Current Medications and Allergies   Current Outpatient Medications:  .  aspirin 325 MG tablet, Take 325 mg by mouth., Disp: , Rfl:  .  Azelastine HCl 0.15 % SOLN, Place 1 spray into both nostrils daily., Disp: 90 mL, Rfl: 0 .  Blood Glucose Monitoring Suppl (FREESTYLE FREEDOM LITE) w/Device KIT, USE TO CHECK BLOOD SUGAR AS DIRECTED, Disp: 1 each, Rfl: 0 .  carvedilol (COREG) 25 MG tablet, TAKE 1 TABLET TWICE A DAY, Disp: 180 tablet, Rfl: 1 .  digoxin (LANOXIN) 0.125 MG tablet, TAKE 1 TABLET DAILY (Patient taking differently: Three times a week), Disp: 90 tablet, Rfl: 1 .  ezetimibe-simvastatin (VYTORIN) 10-40 MG tablet, TAKE 1 TABLET DAILY, Disp: 90 tablet, Rfl: 4 .  fluticasone (FLONASE) 50 MCG/ACT nasal  spray, Place 1 spray into both nostrils 2 (two) times daily as needed for allergies or rhinitis., Disp: 48 g, Rfl: 1 .  furosemide (LASIX) 40 MG tablet, TAKE 1 TABLET TWICE A DAY, Disp: 180 tablet, Rfl: 4 .  glucose blood (FREESTYLE LITE) test strip, USE AS INSTRUCTED, Disp: 300 each, Rfl: 3 .  Insulin Glargine (LANTUS SOLOSTAR) 100 UNIT/ML Solostar Pen, INJECT 78 UNITS UNDER THE SKIN DAILY or as directed, Disp: 75 mL, Rfl: 3 .  isosorbide mononitrate (IMDUR) 30 MG 24 hr tablet, TAKE 1 TABLET DAILY, Disp: 90  tablet, Rfl: 3 .  Lancets (FREESTYLE) lancets, USE AS INSTRUCTED, Disp: 400 each, Rfl: 4 .  nitroGLYCERIN (NITROSTAT) 0.4 MG SL tablet, Place 0.4 mg under the tongue., Disp: , Rfl:  .  NOVOLOG FLEXPEN 100 UNIT/ML FlexPen, INJECT 30 UNITS UNDER THE SKIN THREE TIMES A DAY WITH MEALS, Disp: 105 mL, Rfl: 3 .  pantoprazole (PROTONIX) 40 MG tablet, Take 1 tablet (40 mg total) by mouth 2 (two) times daily., Disp: 60 tablet, Rfl: 3 .  potassium chloride SA (K-DUR,KLOR-CON) 20 MEQ tablet, TAKE 1 TABLET DAILY WITH LASIX, Disp: 90 tablet, Rfl: 4 .  sacubitril-valsartan (ENTRESTO) 49-51 MG, Take 1 tablet by mouth 2 (two) times daily., Disp: , Rfl:  .  SURE COMFORT PEN NEEDLES 31G X 5 MM MISC, USE 1 PEN NEEDLE TO INJECT INTO THE SKIN THREE TIMES A DAY, Disp: 400 each, Rfl: 3  No Known Allergies Review of Systems   Pertinent items are noted in the HPI. Otherwise, a complete ROS is negative.  Vitals   Vitals:   08/15/19 0801  BP: 133/81  Pulse: 62  Temp: (!) 97.3 F (36.3 C)  TempSrc: Temporal  SpO2: 98%  Weight: 246 lb 6.4 oz (111.8 kg)  Height: 6' (1.829 m)     Body mass index is 33.42 kg/m.  Physical Exam   Physical Exam Vitals signs and nursing note reviewed.  Constitutional:      General: He is not in acute distress.    Appearance: He is well-developed.  HENT:     Head: Normocephalic and atraumatic.     Right Ear: External ear normal.     Left Ear: External ear normal.     Nose: Nose normal.  Eyes:     Conjunctiva/sclera: Conjunctivae normal.     Pupils: Pupils are equal, round, and reactive to light.  Neck:     Musculoskeletal: Neck supple.  Cardiovascular:     Rate and Rhythm: Normal rate and regular rhythm.  Pulmonary:     Effort: Pulmonary effort is normal.  Abdominal:     General: Bowel sounds are normal.     Palpations: Abdomen is soft.  Musculoskeletal: Normal range of motion.  Skin:    General: Skin is warm.  Neurological:     Mental Status: He is alert.    Psychiatric:        Behavior: Behavior normal.     Results for orders placed or performed in visit on 08/15/19  Comprehensive metabolic panel  Result Value Ref Range   Sodium 142 135 - 145 mEq/L   Potassium 3.7 3.5 - 5.1 mEq/L   Chloride 104 96 - 112 mEq/L   CO2 29 19 - 32 mEq/L   Glucose, Bld 118 (H) 70 - 99 mg/dL   BUN 24 (H) 6 - 23 mg/dL   Creatinine, Ser 1.67 (H) 0.40 - 1.50 mg/dL   Total Bilirubin 0.6 0.2 - 1.2 mg/dL  Alkaline Phosphatase 83 39 - 117 U/L   AST 12 0 - 37 U/L   ALT 12 0 - 53 U/L   Total Protein 6.4 6.0 - 8.3 g/dL   Albumin 3.9 3.5 - 5.2 g/dL   Calcium 8.9 8.4 - 10.5 mg/dL   GFR 39.98 (L) >60.00 mL/min  CBC with Differential/Platelet  Result Value Ref Range   WBC 7.7 4.0 - 10.5 K/uL   RBC 4.87 4.22 - 5.81 Mil/uL   Hemoglobin 14.3 13.0 - 17.0 g/dL   HCT 41.3 39.0 - 52.0 %   MCV 84.9 78.0 - 100.0 fl   MCHC 34.6 30.0 - 36.0 g/dL   RDW 14.2 11.5 - 15.5 %   Platelets 122.0 (L) 150.0 - 400.0 K/uL   Neutrophils Relative % 64.0 43.0 - 77.0 %   Lymphocytes Relative 22.2 12.0 - 46.0 %   Monocytes Relative 12.2 (H) 3.0 - 12.0 %   Eosinophils Relative 1.1 0.0 - 5.0 %   Basophils Relative 0.5 0.0 - 3.0 %   Neutro Abs 4.9 1.4 - 7.7 K/uL   Lymphs Abs 1.7 0.7 - 4.0 K/uL   Monocytes Absolute 0.9 0.1 - 1.0 K/uL   Eosinophils Absolute 0.1 0.0 - 0.7 K/uL   Basophils Absolute 0.0 0.0 - 0.1 K/uL  Lipid panel  Result Value Ref Range   Cholesterol 111 0 - 200 mg/dL   Triglycerides 124.0 0.0 - 149.0 mg/dL   HDL 34.60 (L) >39.00 mg/dL   VLDL 24.8 0.0 - 40.0 mg/dL   LDL Cholesterol 52 0 - 99 mg/dL   Total CHOL/HDL Ratio 3    NonHDL 76.30     Assessment and Plan   Suhaib was seen today for follow-up.  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 Orders and follow up as documented in [place], reviewed diet, exercise and weight control, cardiovascular risk and specific lipid/LDL goals reviewed, reviewed medications and side effects in detail.  . Reviewed expectations re: course of current medical issues. . Outlined signs and symptoms indicating need for more acute intervention. . Patient verbalized understanding and all questions were answered. . Patient received an After Visit Summary.  [name] Deutscher, DO [place], [facility] 08/15/2019

## 2019-08-18 ENCOUNTER — Ambulatory Visit: Payer: Medicare Other | Admitting: Family Medicine

## 2019-08-18 DIAGNOSIS — I429 Cardiomyopathy, unspecified: Secondary | ICD-10-CM | POA: Diagnosis not present

## 2019-08-18 DIAGNOSIS — I5020 Unspecified systolic (congestive) heart failure: Secondary | ICD-10-CM | POA: Diagnosis not present

## 2019-08-18 DIAGNOSIS — I2583 Coronary atherosclerosis due to lipid rich plaque: Secondary | ICD-10-CM | POA: Diagnosis not present

## 2019-08-18 DIAGNOSIS — I2510 Atherosclerotic heart disease of native coronary artery without angina pectoris: Secondary | ICD-10-CM | POA: Diagnosis not present

## 2019-08-22 DIAGNOSIS — Z794 Long term (current) use of insulin: Secondary | ICD-10-CM | POA: Diagnosis not present

## 2019-08-22 DIAGNOSIS — H2513 Age-related nuclear cataract, bilateral: Secondary | ICD-10-CM | POA: Diagnosis not present

## 2019-08-22 DIAGNOSIS — E119 Type 2 diabetes mellitus without complications: Secondary | ICD-10-CM | POA: Diagnosis not present

## 2019-08-22 LAB — HM DIABETES EYE EXAM

## 2019-09-07 ENCOUNTER — Encounter: Payer: Self-pay | Admitting: Family Medicine

## 2019-09-08 ENCOUNTER — Other Ambulatory Visit: Payer: Self-pay

## 2019-09-08 MED ORDER — SURE COMFORT PEN NEEDLES 31G X 5 MM MISC: 3 refills | Status: DC

## 2019-10-30 DIAGNOSIS — Z4502 Encounter for adjustment and management of automatic implantable cardiac defibrillator: Secondary | ICD-10-CM | POA: Diagnosis not present

## 2019-11-14 ENCOUNTER — Other Ambulatory Visit: Payer: Self-pay

## 2019-11-15 ENCOUNTER — Encounter: Payer: Self-pay | Admitting: Family Medicine

## 2019-11-15 ENCOUNTER — Ambulatory Visit (INDEPENDENT_AMBULATORY_CARE_PROVIDER_SITE_OTHER): Payer: Medicare Other | Admitting: Family Medicine

## 2019-11-15 ENCOUNTER — Other Ambulatory Visit: Payer: Self-pay | Admitting: Family Medicine

## 2019-11-15 VITALS — BP 128/62 | HR 56 | Temp 97.9°F | Ht 72.0 in | Wt 247.1 lb

## 2019-11-15 DIAGNOSIS — I129 Hypertensive chronic kidney disease with stage 1 through stage 4 chronic kidney disease, or unspecified chronic kidney disease: Secondary | ICD-10-CM

## 2019-11-15 DIAGNOSIS — E785 Hyperlipidemia, unspecified: Secondary | ICD-10-CM

## 2019-11-15 DIAGNOSIS — I152 Hypertension secondary to endocrine disorders: Secondary | ICD-10-CM

## 2019-11-15 DIAGNOSIS — N182 Chronic kidney disease, stage 2 (mild): Secondary | ICD-10-CM | POA: Diagnosis not present

## 2019-11-15 DIAGNOSIS — J309 Allergic rhinitis, unspecified: Secondary | ICD-10-CM

## 2019-11-15 DIAGNOSIS — I10 Essential (primary) hypertension: Secondary | ICD-10-CM

## 2019-11-15 DIAGNOSIS — I5042 Chronic combined systolic (congestive) and diastolic (congestive) heart failure: Secondary | ICD-10-CM

## 2019-11-15 DIAGNOSIS — K219 Gastro-esophageal reflux disease without esophagitis: Secondary | ICD-10-CM

## 2019-11-15 DIAGNOSIS — E1122 Type 2 diabetes mellitus with diabetic chronic kidney disease: Secondary | ICD-10-CM | POA: Diagnosis not present

## 2019-11-15 DIAGNOSIS — I2510 Atherosclerotic heart disease of native coronary artery without angina pectoris: Secondary | ICD-10-CM | POA: Diagnosis not present

## 2019-11-15 DIAGNOSIS — Z7189 Other specified counseling: Secondary | ICD-10-CM

## 2019-11-15 DIAGNOSIS — E1169 Type 2 diabetes mellitus with other specified complication: Secondary | ICD-10-CM

## 2019-11-15 DIAGNOSIS — E1159 Type 2 diabetes mellitus with other circulatory complications: Secondary | ICD-10-CM | POA: Diagnosis not present

## 2019-11-15 LAB — POCT GLYCOSYLATED HEMOGLOBIN (HGB A1C): Hemoglobin A1C: 6.4 % — AB (ref 4.0–5.6)

## 2019-11-15 NOTE — Assessment & Plan Note (Signed)
A1c 6.4.  Discussed backing off on insulin however patient would like to continue with current plan.  We will continue Lantus 78 units daily NovoLog 3 units 3 times daily with meals.  Does not have frequent significant lows.  Discussed hypoglycemic warning signs.  We will follow-up in 3 to 6 months.

## 2019-11-15 NOTE — Assessment & Plan Note (Signed)
Stable.  Continue Protonix 40 mg twice daily. 

## 2019-11-15 NOTE — Progress Notes (Signed)
   Chief Complaint:  [name] is a 78 y.o. male who presents today with a chief complaint of T2DM.   Assessment/Plan:  Systolic and diastolic CHF, chronic (HCC) Stable.  No signs of overload.  Continue treatment plan per cardiology.  Hypertension associated with diabetes ([place]) At goal.  Continue current medications.  GERD (gastroesophageal reflux disease) Stable.  Continue Protonix 40 mg twice daily.  Hyperlipidemia associated with type 2 diabetes mellitus (HCC) Stable.  Continue Vytorin.  CAD (coronary artery disease), native coronary artery Stable. Continue management per cardiology.   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  Discussed backing off on insulin however patient would like to continue with current plan.  We will continue Lantus 78 units daily NovoLog 3 units 3 times daily with meals.  Does not have frequent significant lows.  Discussed hypoglycemic warning signs.  We will follow-up in 3 to 6 months.  Allergic rhinitis Continue Astelin as needed.    Subjective:  HPI:  His stable, chronic medical conditions are outlined below:  # T2DM - On lantus 78 units daily and novolog 30 units three times daily - Home Sugars: 90s fasting - ROS: No polyuria or polydipsia  # CAD / CHF / Dyslipidemia / Hypertension - Follows with cardiology - Dr Alroy Dust - On coreg 25mg  twice daily, digoxin 0.160mcg 3 times weekly, imdur 30mg  daily, lasix 40mg  twice daily, and entresto 49-51 twice daily - On aspirin 325mg  daily - On vytorin 10-40 once daily - ROS: No reported chest pain or shortness of breath  # GERD - On protonix 40mg  twice daily and tolerating well - ROS: No reported early satiety or unintentional weight loss.   # Allergic Rhinitis - Uses astelin daily  # Actinic Keratosis - Follows with Dermatology  # Osteoarthritis - Uses OTC analgesics as needed  ROS: Per HPI  PMH: He reports that he has quit smoking. He has never used smokeless  tobacco. He reports that he does not drink alcohol or use drugs.      Objective:  Physical Exam: BP 128/62 (BP Location: Right Arm, Patient Position: Sitting, Cuff Size: Large)   Pulse (!) 56   Temp 97.9 F (36.6 C) (Temporal)   Ht 6' (1.829 m)   Wt 247 lb 1.6 oz (112.1 kg)   SpO2 97%   BMI 33.51 kg/m   Gen: NAD, resting comfortably CV: Regular rate and rhythm with no murmurs appreciated Pulm: Normal work of breathing, clear to auscultation bilaterally with no crackles, wheezes, or rhonchi GI: Normal bowel sounds present. Soft, Nontender, Nondistended. MSK: No edema, cyanosis, or clubbing noted Skin: Warm, dry Neuro: Grossly normal, moves all extremities Psych: Normal affect and thought content  Results for orders placed or performed in visit on 11/15/19 (from the past 24 hour(s))  POCT HgB A1C     Status: Abnormal   Collection Time: 11/15/19  8:21 AM  Result Value Ref Range   Hemoglobin A1C 6.4 (A) 4.0 - 5.6 %    Time Spent: I spent ?40 minutes face-to-face with the patient, with more than half spent on counseling for COVID-19 precautions and discussing management plan for his type 2 diabetes, hypertension, GERD, coronary artery disease, and allergic rhinitis.[name]. [name], MD 11/15/2019 8:52 AM

## 2019-11-15 NOTE — Assessment & Plan Note (Signed)
At goal. Continue current medications. 

## 2019-11-15 NOTE — Assessment & Plan Note (Signed)
Stable.  Continue Vytorin. ?

## 2019-11-15 NOTE — Assessment & Plan Note (Signed)
Stable.  No signs of overload.  Continue treatment plan per cardiology.

## 2019-11-15 NOTE — Assessment & Plan Note (Signed)
Continue Astelin as needed.

## 2019-11-15 NOTE — Assessment & Plan Note (Signed)
Stable.  Continue management per cardiology. 

## 2019-11-15 NOTE — Patient Instructions (Signed)
It was very nice to see you today!  Keep up the good work!  Come back in 3-6 months, or sooner if needed.   Take care, Dr [name]  Please try these tips to maintain a healthy lifestyle:   Eat at least 3 REAL meals and 1-2 snacks per day.  Aim for no more than 5 hours between eating.  If you eat breakfast, please do so within one hour of getting up.    Obtain twice as many fruits/vegetables as protein or carbohydrate foods for both lunch and dinner. (Half of each meal should be fruits/vegetables, one quarter protein, and one quarter starchy carbs)   Cut down on sweet beverages. This includes juice, soda, and sweet tea.    Exercise at least 150 minutes every week.

## 2019-11-21 ENCOUNTER — Encounter: Payer: Self-pay | Admitting: Family Medicine

## 2019-11-22 ENCOUNTER — Other Ambulatory Visit: Payer: Self-pay

## 2019-11-22 MED ORDER — NOVOLOG FLEXPEN 100 UNIT/ML ~~LOC~~ SOPN: PEN_INJECTOR | SUBCUTANEOUS | 3 refills | Status: DC

## 2019-11-23 ENCOUNTER — Telehealth: Payer: Self-pay | Admitting: Family Medicine

## 2019-11-23 NOTE — Telephone Encounter (Signed)
Please advise on transfer from Dr. [name] to Dr. Jonni Sanger  Copied from [place] [phone]. Topic: Appointment Scheduling - Transfer of Care >> Nov 23, 2019  1:33 PM Erick Blinks wrote: Pt is requesting to transfer FROM: Dr. [name] Pt is requesting to transfer TO: Dr. Jonni Sanger  Reason for requested transfer: Requesting TOC   Send CRM to patient's current PCP (transferring FROM).

## 2019-11-23 NOTE — Telephone Encounter (Signed)
FYI

## 2019-11-23 NOTE — Telephone Encounter (Signed)
[facility] team, I spoke with Dr. [name] and we want to ensure the reasoning for the patient requesting to transfer is not due to the insulin mychart miscommunication. Please contact patient to advise and to clarify. Please update the other telephone note also about the transfer of care if needed.

## 2019-11-23 NOTE — Telephone Encounter (Signed)
[place] with me.  [name]. [name], MD 11/23/2019 2:06 PM

## 2019-11-24 ENCOUNTER — Other Ambulatory Visit: Payer: Self-pay | Admitting: Family Medicine

## 2019-11-24 NOTE — Telephone Encounter (Signed)
See below; if no extenuating circumstances, please inform pt that we do not allow transfers between providers so he may stay with Dr. [name] or would have to choose a different office. Thanks.

## 2019-11-24 NOTE — Telephone Encounter (Signed)
I think that is reasonable and I do think I remember [place] discussing it. Looks like the team is determining if there other extenuating circumstances, but do not think it is in patient's best interest to jump from provider to provider.  [name]. [name], MD 11/24/2019 12:38 PM

## 2019-11-24 NOTE — Telephone Encounter (Signed)
I think that we agreed that once a former pt of Dr. Juleen China transfers to another provider in this office, that they would stay with them or would have to leave the office; this was to avoid having her patients jump from one of [place] to the others. For this reason, I'm not sure if he should switch. I'm not sure if there are extenuating circumstances that need to be addressed either?  Do you all agree?  Thanks.

## 2019-11-24 NOTE — Telephone Encounter (Signed)
I called the patient to explain the notes below regarding transferring.   Patient is very upset that he can not transfer to another provider within the office as he stated that it was not his choice to leave Dr. Juleen China and she left the practice. I attempted to explain the providers/practices reasoning and was unable to provide a solution he was agreeable to. I suggested he speak with the Practice Administrator of our office to explain a bit further to see what options he can have for transferring. He was agreeable. Call ended.

## 2019-11-29 ENCOUNTER — Telehealth: Payer: Self-pay | Admitting: Family Medicine

## 2019-11-29 NOTE — Telephone Encounter (Signed)
Ok with me 

## 2019-11-29 NOTE — Telephone Encounter (Signed)
Please advise on transfer of care request from Dr. [name] to Dr. Raoul Pitch;    Pt is requesting to transfer FROM: Dr [name] Pt is requesting to transfer TO: Dr Adelina Mings  Reason for requested transfer: due to location; pt states he lives very closed to [place] location Best number:  [phone]   Send CRM to patient's current PCP (transferring FROM).

## 2019-11-30 NOTE — Telephone Encounter (Signed)
Ok with me. Thanks.  

## 2019-12-11 ENCOUNTER — Other Ambulatory Visit: Payer: Self-pay

## 2019-12-12 ENCOUNTER — Encounter: Payer: Self-pay | Admitting: Family Medicine

## 2019-12-12 ENCOUNTER — Ambulatory Visit (INDEPENDENT_AMBULATORY_CARE_PROVIDER_SITE_OTHER): Payer: Medicare Other | Admitting: Family Medicine

## 2019-12-12 VITALS — BP 108/82 | HR 56 | Temp 97.7°F | Resp 16 | Ht 72.0 in | Wt 245.4 lb

## 2019-12-12 DIAGNOSIS — E1122 Type 2 diabetes mellitus with diabetic chronic kidney disease: Secondary | ICD-10-CM | POA: Diagnosis not present

## 2019-12-12 DIAGNOSIS — I252 Old myocardial infarction: Secondary | ICD-10-CM | POA: Diagnosis not present

## 2019-12-12 DIAGNOSIS — I10 Essential (primary) hypertension: Secondary | ICD-10-CM

## 2019-12-12 DIAGNOSIS — E1169 Type 2 diabetes mellitus with other specified complication: Secondary | ICD-10-CM | POA: Diagnosis not present

## 2019-12-12 DIAGNOSIS — I2510 Atherosclerotic heart disease of native coronary artery without angina pectoris: Secondary | ICD-10-CM

## 2019-12-12 DIAGNOSIS — K219 Gastro-esophageal reflux disease without esophagitis: Secondary | ICD-10-CM | POA: Diagnosis not present

## 2019-12-12 DIAGNOSIS — E785 Hyperlipidemia, unspecified: Secondary | ICD-10-CM | POA: Diagnosis not present

## 2019-12-12 DIAGNOSIS — I129 Hypertensive chronic kidney disease with stage 1 through stage 4 chronic kidney disease, or unspecified chronic kidney disease: Secondary | ICD-10-CM

## 2019-12-12 DIAGNOSIS — E1159 Type 2 diabetes mellitus with other circulatory complications: Secondary | ICD-10-CM | POA: Diagnosis not present

## 2019-12-12 DIAGNOSIS — I152 Hypertension secondary to endocrine disorders: Secondary | ICD-10-CM

## 2019-12-12 DIAGNOSIS — N182 Chronic kidney disease, stage 2 (mild): Secondary | ICD-10-CM

## 2019-12-12 DIAGNOSIS — I5042 Chronic combined systolic (congestive) and diastolic (congestive) heart failure: Secondary | ICD-10-CM | POA: Diagnosis not present

## 2019-12-12 DIAGNOSIS — Z95810 Presence of automatic (implantable) cardiac defibrillator: Secondary | ICD-10-CM

## 2019-12-12 DIAGNOSIS — N1832 Chronic kidney disease, stage 3b: Secondary | ICD-10-CM | POA: Diagnosis not present

## 2019-12-12 DIAGNOSIS — D696 Thrombocytopenia, unspecified: Secondary | ICD-10-CM

## 2019-12-12 NOTE — Patient Instructions (Signed)
Very nice to meet you today.  Follow up in 3-4 months to keep you on track for your diabetes check.  I will make sure you have enough medications called into your scripts.   Please help [place] help you:  We are honored you have chosen [facility] for your Primary Care home. Below you will find basic instructions that you may need to access in the future. Please help [place] help you by reading the instructions, which cover many of the frequent questions we experience.   Prescription refills and request:  -In order to allow more efficient response time, please call your pharmacy for all refills. They will forward the request electronically to [place]. This allows for the quickest possible response. Request left on a nurse line can take longer to refill, since these are checked as time allows between office patients and other phone calls.  - refill request can take up to 3-5 working days to complete.  - If request is sent electronically and request is appropiate, it is usually completed in 1-2 business days.  - all patients will need to be seen routinely for all chronic medical conditions requiring prescription medications (see follow-up below). If you are overdue for follow up on your condition, you will be asked to make an appointment and we will call in enough medication to cover you until your appointment (up to 30 days).  - all controlled substances will require a face to face visit to request/refill.  - if you desire your prescriptions to go through a new pharmacy, and have an active script at original pharmacy, you will need to call your pharmacy and have scripts transferred to new pharmacy. This is completed between the pharmacy locations and not by your provider.    Results: If any images or labs were ordered, it can take up to 1 week to get results depending on the test ordered and the lab/facility running and resulting the test. - Normal or stable results, which do not need further discussion, may  be released to your mychart immediately with attached note to you. A call may not be generated for normal results. Please make certain to sign up for mychart. If you have questions on how to activate your mychart you can call the front office.  - If your results need further discussion, our office will attempt to contact you via phone, and if unable to reach you after 2 attempts, we will release your abnormal result to your mychart with instructions.  - All results will be automatically released in mychart after 1 week.  - Your provider will provide you with explanation and instruction on all relevant material in your results. Please keep in mind, results and labs may appear confusing or abnormal to the untrained eye, but it does not mean they are actually abnormal for you personally. If you have any questions about your results that are not covered, or you desire more detailed explanation than what was provided, you should make an appointment with your provider to do so.   Our office handles many outgoing and incoming calls daily. If we have not contacted you within 1 week about your results, please check your mychart to see if there is a message first and if not, then contact our office.  In helping with this matter, you help decrease call volume, and therefore allow [place] to be able to respond to patients needs more efficiently.   Acute office visits (sick visit):  An acute visit is intended  for a new problem and are scheduled in shorter time slots to allow schedule openings for patients with new problems. This is the appropriate visit to discuss a new problem. Problems will not be addressed by phone call or Echart message. Appointment is needed if requesting treatment. In order to provide you with excellent quality medical care with proper time for you to explain your problem, have an exam and receive treatment with instructions, these appointments should be limited to one new problem per visit. If you  experience a new problem, in which you desire to be addressed, please make an acute office visit, we save openings on the schedule to accommodate you. Please do not save your new problem for any other type of visit, let us take care of it properly and quickly for you.   Follow up visits:  Depending on your condition(s) your provider will need to see you routinely in order to provide you with quality care and prescribe medication(s). Most chronic conditions (Example: hypertension, Diabetes, depression/anxiety... etc), require visits a couple times a year. Your provider will instruct you on proper follow up for your personal medical conditions and history. Please make certain to make follow up appointments for your condition as instructed. Failing to do so could result in lapse in your medication treatment/refills. If you request a refill, and are overdue to be seen on a condition, we will always provide you with a 30 day script (once) to allow you time to schedule.    Medicare wellness (well visit): - we have a wonderful Nurse Maudie Mercury), that will meet with you and provide you will yearly medicare wellness visits. These visits should occur yearly (can not be scheduled less than 1 calendar year apart) and cover preventive health, immunizations, advance directives and screenings you are entitled to yearly through your medicare benefits. Do not miss out on your entitled benefits, this is when medicare will pay for these benefits to be ordered for you.  These are strongly encouraged by your provider and is the appropriate type of visit to make certain you are up to date with all preventive health benefits. If you have not had your medicare wellness exam in the last 12 months, please make certain to schedule one by calling the office and schedule your medicare wellness with Maudie Mercury as soon as possible.   Yearly physical (well visit):  - Adults are recommended to be seen yearly for physicals. Check with your insurance  and date of your last physical, most insurances require one calendar year between physicals. Physicals include all preventive health topics, screenings, medical exam and labs that are appropriate for gender/age and history. You may have fasting labs needed at this visit. This is a well visit (not a sick visit), new problems should not be covered during this visit (see acute visit).  - Pediatric patients are seen more frequently when they are younger. Your provider will advise you on well child visit timing that is appropriate for your their age. - This is not a medicare wellness visit. Medicare wellness exams do not have an exam portion to the visit. Some medicare companies allow for a physical, some do not allow a yearly physical. If your medicare allows a yearly physical you can schedule the medicare wellness with our nurse Maudie Mercury and have your physical with your provider after, on the same day. Please check with insurance for your full benefits.   Late Policy/No Shows:  - all new patients should arrive 15-30 minutes earlier than appointment  to allow [place] time  to  obtain all personal demographics,  insurance information and for you to complete office paperwork. - All established patients should arrive 10-15 minutes earlier than appointment time to update all information and be checked in .  - In our best efforts to run on time, if you are late for your appointment you will be asked to either reschedule or if able, we will work you back into the schedule. There will be a wait time to work you back in the schedule,  depending on availability.  - If you are unable to make it to your appointment as scheduled, please call 24 hours ahead of time to allow [place] to fill the time slot with someone else who needs to be seen. If you do not cancel your appointment ahead of time, you may be charged a no show fee.

## 2019-12-12 NOTE — Progress Notes (Signed)
Patient ID: [name], male  DOB: October 03, 1941, 79 y.o.   MRN: [id number] Patient Care Team    Relationship Specialty Notifications Start End  Ma Hillock, DO PCP - General Family Medicine  12/12/19   Bonney Aid, MD Referring Physician Cardiology  08/23/18   Regenia Skeeter, MD Referring Physician Nephrology  08/23/18   Crosby Oyster, MD Referring Physician Dermatology  08/23/18   Otelia Sergeant, OD Referring Physician Optometry  12/18/19     Chief Complaint  Patient presents with  . Establish Care    Primary PCP was Dr [name]. Pt does not need refills.     Subjective:  [name] is a 79 y.o.  male present for TOC. All past medical history, surgical history, allergies, family history, immunizations, medications and social history were updated in the electronic medical record today. All recent labs, ED visits and hospitalizations within the last year were reviewed.  GERD: Well-controlled with Protonix twice daily. Diabetes:Pt reports compliance with NovoLog 30 units 3 times daily before meals and Lantus 72 units daily. Denies numbness, tingling of extremities, hypo/hyperglycemic events or non-healing wounds. Pt reports BG ranges fasting 80-1 0. PNA series: Completed Flu shot: Up-to-date 2020 (recommneded yearly) Foot exam: Up-to-date 05/2019 Microalbumin: Up-to-date 05/2019 Eye exam: Up-to-date 05/2019 Dr. Joaquim Lai A1c: 6.4 (11/14/2019)  HTN/HLD/CHFrEF/defib in place/CAD/CKD 3: Pt reports compliance with Coreg 25, digoxin 3 times a week, Lasix 40 mg daily, Imdur, simvastatin, [name]. Blood pressures ranges at home WNL. Patient denies chest pain, shortness of breath or lower extremity edema. Pt 325 daily baby ASA. Pt is  prescribed statin. BMP: CKD 3-last BMP 08/15/2019 GFR 39, creatinine 1.67.  Baseline. CBC: 08/15/2019 platelets 122-seems to be at baseline. Cardiology refills is [facility]. Patient has a history of ischemic  cardiomyopathy, status post biventricular pacemaker, coronary artery disease with MI in 1993 and 2003.  Mitral regurg.  Last echo 02/2018 ejection fraction less than 25%.  Depression screen [facility] 2/9 08/15/2019 05/17/2019 12/06/2018 02/17/2018 09/28/2017  Decreased Interest 0 0 0 0 0  Down, Depressed, Hopeless 0 0 0 0 0  PHQ - 2 Score 0 0 0 0 0  Altered sleeping 0 0 0 - -  Tired, decreased energy 0 0 0 - -  Change in appetite 0 0 0 - -  Feeling bad or failure about yourself  0 0 0 - -  Trouble concentrating 0 0 0 - -  Moving slowly or fidgety/restless 0 0 0 - -  Suicidal thoughts 0 0 0 - -  PHQ-9 Score 0 0 0 - -  Difficult doing work/chores Not difficult at all Not difficult at all Not difficult at all - -   No flowsheet data found.      Fall Risk  11/15/2019 05/17/2019 12/06/2018 02/17/2018 09/28/2017  Falls in the past year? 0 0 0 No No  Number falls in past yr: 0 - 0 - -  Injury with Fall? 0 - 0 - -  Follow up - Falls evaluation completed - - -     Immunization History  Administered Date(s) Administered  . Fluad Quad(high Dose 65+) 08/15/2019  . Influenza Split 10/11/2007, 08/14/2010, 09/08/2011  . Influenza, High Dose Seasonal PF 10/11/2007, 08/14/2010, 09/08/2011, 10/24/2015, 11/06/2016, 09/28/2017, 08/23/2018  . Influenza, Seasonal, Injecte, Preservative Fre 12/18/2013  . Influenza,inj,Quad PF,6+ Mos 12/18/2013  . Pneumococcal Conjugate-13 04/26/2015  . Pneumococcal Polysaccharide-23 04/06/2012  . Td 04/22/2006, 04/22/2006  . Tdap 04/26/2015  .  Zoster 05/27/2015    No exam data present  Past Medical History:  Diagnosis Date  . Abnormal echocardiogram 08/26/2011  . Abnormal nuclear stress test 08/26/2011  . Allergic rhinitis 09/28/2017  . Allergy   . 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 CAD (coronary artery disease), native coronary artery 09/28/2017  . Cardiac defibrillator in place 09/28/2017  . 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 Laurance Flatten  . Congestive heart failure ([place]) 09/28/2017  . DM (diabetes mellitus), type 2 ([place]) 09/04/2013  . Enthesopathy of hip region 01/04/2015  . GERD (gastroesophageal reflux disease) 09/28/2017  . History of actinic keratoses 09/12/2015  . Hyperlipidemia 09/28/2017  . Hypertension associated with diabetes ([place]) 09/04/2013  . Mitral valve incompetence 09/28/2017  . Obesity (BMI 30-39.9) 05/12/2017  . 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 Andria Frames, Dr. Purcell Nails  . Systolic and diastolic CHF, chronic ([place]) 08/26/2011  . Thrombocytopenia ([place])    No Known Allergies Past Surgical History:  Procedure Laterality Date  . TONSILLECTOMY AND ADENOIDECTOMY Bilateral 1950   Family History  Problem Relation Age of Onset  . Diabetes Father   . Heart attack Father   . Lung cancer Father   . Hypertension Sister    Social History   Social History Narrative   Marital status/children/pets: Married.   Education/employment: High school graduate.  Retired.   Safety:      -smoke alarm in the home:Yes     - wears seatbelt: No     - Feels safe in their relationships: Yes    Allergies as of 12/12/2019   No Known Allergies     Medication List       Accurate as of December 12, 2019 11:59 PM. If you have any questions, ask your nurse or doctor.        aspirin 325 MG tablet Take 325 mg by mouth.   Azelastine HCl 0.15 % Soln Place 1 spray into both nostrils daily.   carvedilol 25 MG tablet Commonly known as: COREG Take 1 tablet (25 mg total) by mouth 2 (two) times daily.   digoxin 0.125 MG tablet Commonly known as: LANOXIN Three times a week   Entresto 49-51 MG Generic drug: sacubitril-valsartan Take 1 tablet by mouth 2 (two) times daily.   ezetimibe-simvastatin 10-40 MG tablet Commonly known as: VYTORIN Take 1 tablet by mouth daily.   fluticasone 50 MCG/ACT nasal spray Commonly known as: FLONASE Place 1 spray into  both nostrils 2 (two) times daily as needed for allergies or rhinitis.   FreeStyle Freedom Lite w/Device Kit USE TO CHECK BLOOD SUGAR AS DIRECTED   freestyle lancets USE AS INSTRUCTED   FREESTYLE LITE test strip Generic drug: glucose blood USE AS INSTRUCTED   furosemide 40 MG tablet Commonly known as: LASIX Take 1 tablet (40 mg total) by mouth 2 (two) times daily.   isosorbide mononitrate 30 MG 24 hr tablet Commonly known as: IMDUR Take 1 tablet (30 mg total) by mouth daily.   Lantus SoloStar 100 UNIT/ML Solostar Pen Generic drug: Insulin Glargine INJECT 72 UNITS UNDER THE SKIN DAILY or as directed What changed: additional instructions Changed by: Howard Pouch, DO   nitroGLYCERIN 0.4 MG SL tablet Commonly known as: NITROSTAT Place 0.4 mg under the tongue.   NovoLOG FlexPen 100 UNIT/ML FlexPen Generic drug: insulin aspart INJECT 30 UNITS UNDER THE SKIN THREE TIMES A DAY WITH MEALS   pantoprazole  40 MG tablet Commonly known as: PROTONIX Take 1 tablet (40 mg total) by mouth 2 (two) times daily.   potassium chloride SA 20 MEQ tablet Commonly known as: KLOR-CON 1 tab p.o. with Lasix use. What changed: See the new instructions. Changed by: Howard Pouch, DO   Sure Comfort Pen Needles 31G X 5 MM Misc Generic drug: Insulin Pen Needle USE 1 PEN NEEDLE TO INJECT INTO THE SKIN THREE TIMES A DAY       All past medical history, surgical history, allergies, family history, immunizations andmedications were updated in the EMR today and reviewed under the history and medication portions of their EMR.    Recent Results (from the past 2160 hour(s))  POCT HgB A1C     Status: Abnormal   Collection Time: 11/15/19  8:21 AM  Result Value Ref Range   Hemoglobin A1C 6.4 (A) 4.0 - 5.6 %    No results found.   ROS: 14 pt review of systems performed and negative (unless mentioned in an HPI)  Objective: BP 108/82 (BP Location: Left Arm, Patient Position: Sitting, Cuff Size: Large)    Pulse (!) 56   Temp 97.7 F (36.5 C) (Temporal)   Resp 16   Ht 6' (1.829 m)   Wt 245 lb 6 oz (111.3 kg)   SpO2 97%   BMI 33.28 kg/m  Gen: Afebrile. No acute distress. Nontoxic in appearance, well-developed, well-nourished, very pleasant obese Caucasian male. HENT: AT. [place].  Eyes:Pupils Equal Round Reactive to light, Extraocular movements intact,  Conjunctiva without redness, discharge or icterus. CV: RRR no murmur, no edema Chest: CTAB, no wheeze, rhonchi or crackles.  Skin: Warm and well-perfused. Skin intact. Neuro/Msk:  Normal gait. PERLA. EOMi. Alert. Oriented x3.   Psych: Normal affect, dress and demeanor. Normal speech. Normal thought content and judgment.   Assessment/plan: [name] is a 79 y.o. male present for TOC Gastroesophageal reflux disease without esophagitis Stable. Refilled Protonix 40 mg twice daily. Monitor B12, mag, vitamin D every 3 years.  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 Continue to monitor blood sugars. Hold insulin if glucose less than 85 before meals for now. Continue Lantus 72 units. Continue NovoLog 30 units 3 times daily. PNA series: Completed Flu shot: Up-to-date 2020 (recommneded yearly) Foot exam: Up-to-date 05/2019 Microalbumin: Up-to-date 05/2019 Eye exam: Up-to-date 05/2019 Dr. Joaquim Lai A1c: 6.4 (11/14/2019) Follow-up every 3-4 months.  HFrEF/CAD/Cardiac defibrillator in place/HTN/HLD Stable. Continue routine follow-ups with cardiology. Entresto prescribed by cardiology team. Continue Imdur 30 mg daily. Continue Lasix 40 mg twice daily Continue K. Dur 20 mEq daily with Lasix Continue Coreg 25 mg twice daily Continue Vytorin 10-40 daily Continue digoxin 0.125 mg 3 times a week Continue aspirin 325 mg daily  Stage 3b chronic kidney disease Patient is established with nephrology.  Continue routine follow-ups. Avoid all NSAIDs. BMP encouraged every 4 to 6  months. PTH/calcium/vitamin D every 6-12 months. Renally dose medications when appropriate    Return in about 3 months (around 03/11/2020). No orders of the defined types were placed in this encounter.  Meds ordered this encounter  Medications  . pantoprazole (PROTONIX) 40 MG tablet    Sig: Take 1 tablet (40 mg total) by mouth 2 (two) times daily.    Dispense:  180 tablet    Refill:  3  . isosorbide mononitrate (IMDUR) 30 MG 24 hr tablet    Sig: Take 1 tablet (30 mg total) by mouth daily.  Dispense:  90 tablet    Refill:  1    DC prior scripts change in PCP  . Insulin Glargine (LANTUS SOLOSTAR) 100 UNIT/ML Solostar Pen    Sig: INJECT 72 UNITS UNDER THE SKIN DAILY or as directed    Dispense:  75 mL    Refill:  1    DC prior prescription change in PCP  . furosemide (LASIX) 40 MG tablet    Sig: Take 1 tablet (40 mg total) by mouth 2 (two) times daily.    Dispense:  180 tablet    Refill:  1  . fluticasone (FLONASE) 50 MCG/ACT nasal spray    Sig: Place 1 spray into both nostrils 2 (two) times daily as needed for allergies or rhinitis.    Dispense:  48 g    Refill:  1  . ezetimibe-simvastatin (VYTORIN) 10-40 MG tablet    Sig: Take 1 tablet by mouth daily.    Dispense:  90 tablet    Refill:  1    DC prior scripts, change in PCP  . digoxin (LANOXIN) 0.125 MG tablet    Sig: Three times a week    Dispense:  40 tablet    Refill:  2    DC prior scripts, change in PCP  . carvedilol (COREG) 25 MG tablet    Sig: Take 1 tablet (25 mg total) by mouth 2 (two) times daily.    Dispense:  180 tablet    Refill:  1    Please see prior [facility], change in PCP  . potassium chloride SA (KLOR-CON) 20 MEQ tablet    Sig: 1 tab p.o. with Lasix use.    Dispense:  90 tablet    Refill:  4    Note is dictated utilizing voice recognition software. Although note has been proof read prior to signing, occasional typographical errors still can be missed. If any questions arise, please do not hesitate  to call for verification.  Electronically signed by: Howard Pouch, DO [place]

## 2019-12-13 MED ORDER — FLUTICASONE PROPIONATE 50 MCG/ACT NA SUSP: 1.0000 | Freq: Two times a day (BID) | NASAL | 1 refills | Status: DC | PRN

## 2019-12-13 MED ORDER — ISOSORBIDE MONONITRATE ER 30 MG PO TB24: 30.0000 mg | ORAL_TABLET | Freq: Every day | ORAL | 1 refills | Status: DC

## 2019-12-13 MED ORDER — DIGOXIN 125 MCG PO TABS: ORAL_TABLET | ORAL | 2 refills | Status: DC

## 2019-12-13 MED ORDER — PANTOPRAZOLE SODIUM 40 MG PO TBEC: 40.0000 mg | DELAYED_RELEASE_TABLET | Freq: Two times a day (BID) | ORAL | 3 refills | Status: AC

## 2019-12-13 MED ORDER — EZETIMIBE-SIMVASTATIN 10-40 MG PO TABS: 1.0000 | ORAL_TABLET | Freq: Every day | ORAL | 1 refills | Status: DC

## 2019-12-13 MED ORDER — LANTUS SOLOSTAR 100 UNIT/ML ~~LOC~~ SOPN: PEN_INJECTOR | SUBCUTANEOUS | 1 refills | Status: DC

## 2019-12-13 MED ORDER — CARVEDILOL 25 MG PO TABS: 25.0000 mg | ORAL_TABLET | Freq: Two times a day (BID) | ORAL | 1 refills | Status: DC

## 2019-12-13 MED ORDER — FUROSEMIDE 40 MG PO TABS: 40.0000 mg | ORAL_TABLET | Freq: Two times a day (BID) | ORAL | 1 refills | Status: DC

## 2019-12-18 ENCOUNTER — Encounter: Payer: Self-pay | Admitting: Family Medicine

## 2019-12-18 DIAGNOSIS — I252 Old myocardial infarction: Secondary | ICD-10-CM | POA: Insufficient documentation

## 2019-12-18 DIAGNOSIS — D696 Thrombocytopenia, unspecified: Secondary | ICD-10-CM | POA: Insufficient documentation

## 2019-12-18 DIAGNOSIS — N183 Chronic kidney disease, stage 3 unspecified: Secondary | ICD-10-CM | POA: Insufficient documentation

## 2019-12-18 MED ORDER — POTASSIUM CHLORIDE CRYS ER 20 MEQ PO TBCR: EXTENDED_RELEASE_TABLET | ORAL | 4 refills | Status: DC

## 2019-12-27 DIAGNOSIS — I2583 Coronary atherosclerosis due to lipid rich plaque: Secondary | ICD-10-CM | POA: Diagnosis not present

## 2019-12-27 DIAGNOSIS — I340 Nonrheumatic mitral (valve) insufficiency: Secondary | ICD-10-CM | POA: Diagnosis not present

## 2019-12-27 DIAGNOSIS — I5020 Unspecified systolic (congestive) heart failure: Secondary | ICD-10-CM | POA: Diagnosis not present

## 2019-12-27 DIAGNOSIS — I509 Heart failure, unspecified: Secondary | ICD-10-CM | POA: Diagnosis not present

## 2019-12-27 DIAGNOSIS — I2510 Atherosclerotic heart disease of native coronary artery without angina pectoris: Secondary | ICD-10-CM | POA: Diagnosis not present

## 2019-12-27 DIAGNOSIS — Z95810 Presence of automatic (implantable) cardiac defibrillator: Secondary | ICD-10-CM | POA: Diagnosis not present

## 2019-12-27 DIAGNOSIS — Z950 Presence of cardiac pacemaker: Secondary | ICD-10-CM | POA: Diagnosis not present

## 2019-12-27 DIAGNOSIS — I429 Cardiomyopathy, unspecified: Secondary | ICD-10-CM | POA: Diagnosis not present

## 2019-12-28 ENCOUNTER — Ambulatory Visit: Payer: Medicare Other | Attending: Internal Medicine

## 2019-12-28 DIAGNOSIS — Z23 Encounter for immunization: Secondary | ICD-10-CM

## 2019-12-28 NOTE — Progress Notes (Signed)
   Covid-19 Vaccination Clinic  Name:  [name]    MRN: [id number] DOB: 1941/09/03  12/28/2019  [name] was observed post Covid-19 immunization for 15 minutes without incidence. He was provided with Vaccine Information Sheet and instruction to access the V-Safe system.   [name] was instructed to call 911 with any severe reactions post vaccine: Marland Kitchen Difficulty breathing  . Swelling of your face and throat  . A fast heartbeat  . A bad rash all over your body  . Dizziness and weakness    Immunizations Administered    Name Date Dose VIS Date Route   Pfizer COVID-19 Vaccine 12/28/2019  9:24 AM 0.3 mL 11/17/2019 Intramuscular   Manufacturer: [place]   Lot: [id number]   [place]: [id number]

## 2020-01-18 ENCOUNTER — Ambulatory Visit: Payer: TRICARE For Life (TFL)

## 2020-01-29 ENCOUNTER — Ambulatory Visit: Payer: TRICARE For Life (TFL)

## 2020-01-29 DIAGNOSIS — Z4502 Encounter for adjustment and management of automatic implantable cardiac defibrillator: Secondary | ICD-10-CM | POA: Diagnosis not present

## 2020-02-01 ENCOUNTER — Ambulatory Visit: Payer: Medicare Other | Attending: Internal Medicine

## 2020-02-01 DIAGNOSIS — Z23 Encounter for immunization: Secondary | ICD-10-CM | POA: Insufficient documentation

## 2020-02-01 NOTE — Progress Notes (Signed)
   Covid-19 Vaccination Clinic  Name:  [name]    MRN: [id number] DOB: May 18, 1941  02/01/2020  [name] was observed post Covid-19 immunization for 15 minutes without incidence. He was provided with Vaccine Information Sheet and instruction to access the V-Safe system.   [name] was instructed to call 911 with any severe reactions post vaccine: Marland Kitchen Difficulty breathing  . Swelling of your face and throat  . A fast heartbeat  . A bad rash all over your body  . Dizziness and weakness    Immunizations Administered    Name Date Dose VIS Date Route   Pfizer COVID-19 Vaccine 02/01/2020 10:43 AM 0.3 mL 11/17/2019 Intramuscular   Manufacturer: [place]   Lot: [id number]   [place]: [id number]

## 2020-02-15 DIAGNOSIS — I255 Ischemic cardiomyopathy: Secondary | ICD-10-CM | POA: Diagnosis not present

## 2020-02-15 DIAGNOSIS — I340 Nonrheumatic mitral (valve) insufficiency: Secondary | ICD-10-CM | POA: Diagnosis not present

## 2020-02-15 DIAGNOSIS — Z950 Presence of cardiac pacemaker: Secondary | ICD-10-CM | POA: Diagnosis not present

## 2020-02-15 DIAGNOSIS — Z79899 Other long term (current) drug therapy: Secondary | ICD-10-CM | POA: Diagnosis not present

## 2020-02-22 ENCOUNTER — Encounter: Payer: Self-pay | Admitting: Family Medicine

## 2020-02-22 NOTE — Telephone Encounter (Signed)
These advise patient with his decreased kidney function and his mildly lower than normal platelets, along with his use of daily aspirin for his cardiac conditions he would not be ideal to use NSAIDs (which is ibuprofen/Advil/naproxen etc.) for pain.  NSAIDs can cause increase in reflux which she has GERD, decreased platelets especially when used at the same time as aspirin and decreased kidney function. Tylenol use would be okay per label instructions.

## 2020-03-11 ENCOUNTER — Ambulatory Visit: Payer: Medicare Other | Admitting: Family Medicine

## 2020-03-12 ENCOUNTER — Ambulatory Visit (INDEPENDENT_AMBULATORY_CARE_PROVIDER_SITE_OTHER): Payer: Medicare Other | Admitting: Family Medicine

## 2020-03-12 ENCOUNTER — Encounter: Payer: Self-pay | Admitting: Family Medicine

## 2020-03-12 ENCOUNTER — Other Ambulatory Visit: Payer: Self-pay

## 2020-03-12 VITALS — BP 113/58 | HR 95 | Temp 98.0°F | Resp 17 | Ht 72.0 in | Wt 245.1 lb

## 2020-03-12 DIAGNOSIS — D696 Thrombocytopenia, unspecified: Secondary | ICD-10-CM

## 2020-03-12 DIAGNOSIS — Z95810 Presence of automatic (implantable) cardiac defibrillator: Secondary | ICD-10-CM

## 2020-03-12 DIAGNOSIS — Z79899 Other long term (current) drug therapy: Secondary | ICD-10-CM

## 2020-03-12 DIAGNOSIS — J309 Allergic rhinitis, unspecified: Secondary | ICD-10-CM

## 2020-03-12 DIAGNOSIS — I5042 Chronic combined systolic (congestive) and diastolic (congestive) heart failure: Secondary | ICD-10-CM | POA: Diagnosis not present

## 2020-03-12 DIAGNOSIS — N1832 Chronic kidney disease, stage 3b: Secondary | ICD-10-CM | POA: Diagnosis not present

## 2020-03-12 DIAGNOSIS — K219 Gastro-esophageal reflux disease without esophagitis: Secondary | ICD-10-CM | POA: Diagnosis not present

## 2020-03-12 DIAGNOSIS — N183 Chronic kidney disease, stage 3 unspecified: Secondary | ICD-10-CM | POA: Diagnosis not present

## 2020-03-12 DIAGNOSIS — Z794 Long term (current) use of insulin: Secondary | ICD-10-CM | POA: Diagnosis not present

## 2020-03-12 DIAGNOSIS — E1159 Type 2 diabetes mellitus with other circulatory complications: Secondary | ICD-10-CM | POA: Diagnosis not present

## 2020-03-12 DIAGNOSIS — I152 Hypertension secondary to endocrine disorders: Secondary | ICD-10-CM

## 2020-03-12 DIAGNOSIS — I129 Hypertensive chronic kidney disease with stage 1 through stage 4 chronic kidney disease, or unspecified chronic kidney disease: Secondary | ICD-10-CM

## 2020-03-12 DIAGNOSIS — I10 Essential (primary) hypertension: Secondary | ICD-10-CM

## 2020-03-12 DIAGNOSIS — I2510 Atherosclerotic heart disease of native coronary artery without angina pectoris: Secondary | ICD-10-CM

## 2020-03-12 DIAGNOSIS — N182 Chronic kidney disease, stage 2 (mild): Secondary | ICD-10-CM

## 2020-03-12 DIAGNOSIS — E1169 Type 2 diabetes mellitus with other specified complication: Secondary | ICD-10-CM | POA: Diagnosis not present

## 2020-03-12 DIAGNOSIS — E1122 Type 2 diabetes mellitus with diabetic chronic kidney disease: Secondary | ICD-10-CM | POA: Diagnosis not present

## 2020-03-12 DIAGNOSIS — E785 Hyperlipidemia, unspecified: Secondary | ICD-10-CM

## 2020-03-12 DIAGNOSIS — I252 Old myocardial infarction: Secondary | ICD-10-CM

## 2020-03-12 LAB — CBC WITH DIFFERENTIAL/PLATELET
Basophils Absolute: 0 10*3/uL (ref 0.0–0.1)
Basophils Relative: 0.3 % (ref 0.0–3.0)
Eosinophils Absolute: 0.1 10*3/uL (ref 0.0–0.7)
Eosinophils Relative: 1.2 % (ref 0.0–5.0)
HCT: 42.2 % (ref 39.0–52.0)
Hemoglobin: 14.5 g/dL (ref 13.0–17.0)
Lymphocytes Relative: 20.9 % (ref 12.0–46.0)
Lymphs Abs: 1.4 10*3/uL (ref 0.7–4.0)
MCHC: 34.5 g/dL (ref 30.0–36.0)
MCV: 85.9 fl (ref 78.0–100.0)
Monocytes Absolute: 0.8 10*3/uL (ref 0.1–1.0)
Monocytes Relative: 10.9 % (ref 3.0–12.0)
Neutro Abs: 4.6 10*3/uL (ref 1.4–7.7)
Neutrophils Relative %: 66.7 % (ref 43.0–77.0)
Platelets: 120 10*3/uL — ABNORMAL LOW (ref 150.0–400.0)
RBC: 4.91 Mil/uL (ref 4.22–5.81)
RDW: 14.3 % (ref 11.5–15.5)
WBC: 6.9 10*3/uL (ref 4.0–10.5)

## 2020-03-12 LAB — POCT GLYCOSYLATED HEMOGLOBIN (HGB A1C)
HbA1c POC (<> result, manual entry): 7 % (ref 4.0–5.6)
HbA1c, POC (controlled diabetic range): 7 % (ref 0.0–7.0)
HbA1c, POC (prediabetic range): 7 % — AB (ref 5.7–6.4)
Hemoglobin A1C: 7 % — AB (ref 4.0–5.6)

## 2020-03-12 LAB — RENAL FUNCTION PANEL
Albumin: 4 g/dL (ref 3.5–5.2)
BUN: 27 mg/dL — ABNORMAL HIGH (ref 6–23)
CO2: 27 mEq/L (ref 19–32)
Calcium: 8.6 mg/dL (ref 8.4–10.5)
Chloride: 103 mEq/L (ref 96–112)
Creatinine, Ser: 1.81 mg/dL — ABNORMAL HIGH (ref 0.40–1.50)
GFR: 36.38 mL/min — ABNORMAL LOW (ref >60.00)
Glucose, Bld: 147 mg/dL — ABNORMAL HIGH (ref 70–99)
Phosphorus: 3.5 mg/dL (ref 2.3–4.6)
Potassium: 3.7 mEq/L (ref 3.5–5.1)
Sodium: 141 mEq/L (ref 135–145)

## 2020-03-12 LAB — MAGNESIUM: Magnesium: 1.2 mg/dL — ABNORMAL LOW (ref 1.5–2.5)

## 2020-03-12 LAB — VITAMIN D 25 HYDROXY (VIT D DEFICIENCY, FRACTURES): VITD: 24.82 ng/mL — ABNORMAL LOW (ref 30.00–100.00)

## 2020-03-12 LAB — VITAMIN B12: Vitamin B-12: 218 pg/mL (ref 211–911)

## 2020-03-12 MED ORDER — FUROSEMIDE 40 MG PO TABS: 40.0000 mg | ORAL_TABLET | Freq: Two times a day (BID) | ORAL | 1 refills | Status: DC

## 2020-03-12 MED ORDER — ISOSORBIDE MONONITRATE ER 30 MG PO TB24: 30.0000 mg | ORAL_TABLET | Freq: Every day | ORAL | 1 refills | Status: DC

## 2020-03-12 MED ORDER — LANTUS SOLOSTAR 100 UNIT/ML ~~LOC~~ SOPN: PEN_INJECTOR | SUBCUTANEOUS | 1 refills | Status: DC

## 2020-03-12 MED ORDER — CETIRIZINE HCL 10 MG PO TABS: 5.0000 mg | ORAL_TABLET | Freq: Every day | ORAL | 1 refills | Status: DC

## 2020-03-12 MED ORDER — CARVEDILOL 25 MG PO TABS: 25.0000 mg | ORAL_TABLET | Freq: Two times a day (BID) | ORAL | 1 refills | Status: DC

## 2020-03-12 MED ORDER — EZETIMIBE-SIMVASTATIN 10-40 MG PO TABS: 1.0000 | ORAL_TABLET | Freq: Every day | ORAL | 1 refills | Status: DC

## 2020-03-12 MED ORDER — NOVOLOG FLEXPEN 100 UNIT/ML ~~LOC~~ SOPN: PEN_INJECTOR | SUBCUTANEOUS | 3 refills | Status: DC

## 2020-03-12 MED ORDER — FLUTICASONE PROPIONATE 50 MCG/ACT NA SUSP: 1.0000 | Freq: Two times a day (BID) | NASAL | 1 refills | Status: AC | PRN

## 2020-03-12 MED ORDER — AZELASTINE HCL 0.15 % NA SOLN: 1.0000 | Freq: Every day | NASAL | 3 refills | Status: AC

## 2020-03-12 MED ORDER — DIGOXIN 125 MCG PO TABS: ORAL_TABLET | ORAL | 2 refills | Status: DC

## 2020-03-12 NOTE — Patient Instructions (Signed)
I have refilled your medications for you today.  We will call you with lab results Your a1c looks great 7.0 Continue medications at current doses.  We added zyrtec (certizine) 1/2 tab before bed to help cover your allergies better.   Follow up in 4 months.    Continue to follow with your cardiologist and nephrologist.

## 2020-03-12 NOTE — Progress Notes (Signed)
Patient ID: [name], male  DOB: 1941/02/23, 79 y.o.   MRN: [id number] Patient Care Team    Relationship Specialty Notifications Start End  Ma Hillock, DO PCP - General Family Medicine  12/12/19   Bonney Aid, MD Referring Physician Cardiology  08/23/18   Regenia Skeeter, MD Referring Physician Nephrology  08/23/18   Crosby Oyster, MD Referring Physician Dermatology  08/23/18   Otelia Sergeant, OD Referring Physician Optometry  12/18/19     Chief Complaint  Patient presents with  . Diabetes    Pt needs refills.   . seasonal allergies     Pt is asking for a change in medication due to Flonase not working all day   Subjective: [name] is a 79 y.o.  male present for [facility] follow up.  GERD: Patient reports his reflux is well controlled on current medication regimen Diabetes:Pt reports compliance with NovoLog 30 units 3 times daily before meals and Lantus 60 units daily. Patient denies dizziness, hyperglycemic or hypoglycemic events. Patient denies numbness, tingling in the extremities or nonhealing wounds of feet.   Pt reports BG ranges fasting 75-140. PNA series: Completed Flu shot: Up-to-date 2020 (recommneded yearly) Foot exam: Up-to-date 05/2019 Microalbumin: Up-to-date 05/2019 Eye exam: Up-to-date 05/2019 Dr. Joaquim Lai A1c: 6.4> 7.0 today  HTN/HLD/CHFrEF/defib in place/CAD/CKD 3: Pt reports compliance with Coreg 25, digoxin 3 times a week, Lasix 40 mg daily, Imdur, simvastatin, [name]. Blood pressures ranges at home within normal limits. Patient denies chest pain, shortness of breath, dizziness or lower extremity edema.   Pt 325 daily baby ASA. Pt is  prescribed statin. BMP: CKD 3-last BMP 08/15/2019 GFR 39, creatinine 1.67.  Baseline. CBC: 08/15/2019 platelets 122-seems to be at baseline. Cardiology refills is Stann [facility]. Patient has a history of ischemic cardiomyopathy, status post biventricular pacemaker, coronary artery disease with  MI in 1993 and 2003.  Mitral regurg.  Last echo 02/2018 ejection fraction less than 25%.  Allergic rhinitis: Patient reports his allergic rhinitis has been mostly controlled on Flonase and as Astelin.  However he has noticed over the last few weeks more nasal congestion towards the end of the day.  He feels the nasal sprays wear off too quickly.  He is not taking an oral antihistamine.  Depression screen [facility] 2/9 08/15/2019 05/17/2019 12/06/2018 02/17/2018 09/28/2017  Decreased Interest 0 0 0 0 0  Down, Depressed, Hopeless 0 0 0 0 0  PHQ - 2 Score 0 0 0 0 0  Altered sleeping 0 0 0 - -  Tired, decreased energy 0 0 0 - -  Change in appetite 0 0 0 - -  Feeling bad or failure about yourself  0 0 0 - -  Trouble concentrating 0 0 0 - -  Moving slowly or fidgety/restless 0 0 0 - -  Suicidal thoughts 0 0 0 - -  PHQ-9 Score 0 0 0 - -  Difficult doing work/chores Not difficult at all Not difficult at all Not difficult at all - -   No flowsheet data found.      Fall Risk  11/15/2019 05/17/2019 12/06/2018 02/17/2018 09/28/2017  Falls in the past year? 0 0 0 No No  Number falls in past yr: 0 - 0 - -  Injury with Fall? 0 - 0 - -  Follow up - Falls evaluation completed - - -     Immunization History  Administered Date(s) Administered  . Fluad Quad(high Dose  65+) 08/15/2019  . Influenza Split 10/11/2007, 08/14/2010, 09/08/2011  . Influenza, High Dose Seasonal PF 10/11/2007, 08/14/2010, 09/08/2011, 10/24/2015, 11/06/2016, 09/28/2017, 08/23/2018  . Influenza, Seasonal, Injecte, Preservative Fre 12/18/2013  . Influenza,inj,Quad PF,6+ Mos 12/18/2013  . PFIZER SARS-COV-2 Vaccination 12/28/2019, 02/01/2020  . Pneumococcal Conjugate-13 04/26/2015  . Pneumococcal Polysaccharide-23 04/06/2012  . Td 04/22/2006, 04/22/2006  . Tdap 04/26/2015  . Zoster 05/27/2015    No exam data present  Past Medical History:  Diagnosis Date  . Abnormal echocardiogram 08/26/2011  . Abnormal nuclear stress test 08/26/2011    . Allergic rhinitis 09/28/2017  . Allergy   . 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 CAD (coronary artery disease), native coronary artery 09/28/2017  . Cardiac defibrillator in place 09/28/2017  . 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 Laurance Flatten  . Congestive heart failure ([place]) 09/28/2017  . DM (diabetes mellitus), type 2 ([place]) 09/04/2013  . Enthesopathy of hip region 01/04/2015  . GERD (gastroesophageal reflux disease) 09/28/2017  . History of actinic keratoses 09/12/2015  . Hyperlipidemia 09/28/2017  . Hypertension associated with diabetes ([place]) 09/04/2013  . Mitral valve incompetence 09/28/2017  . Obesity (BMI 30-39.9) 05/12/2017  . 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 Andria Frames, Dr. Purcell Nails  . Systolic and diastolic CHF, chronic ([place]) 08/26/2011  . Thrombocytopenia ([place])    No Known Allergies Past Surgical History:  Procedure Laterality Date  . TONSILLECTOMY AND ADENOIDECTOMY Bilateral 1950   Family History  Problem Relation Age of Onset  . Diabetes Father   . Heart attack Father   . Lung cancer Father   . Hypertension Sister    Social History   Social History Narrative   Marital status/children/pets: Married.   Education/employment: High school graduate.  Retired.   Safety:      -smoke alarm in the home:Yes     - wears seatbelt: No     - Feels safe in their relationships: Yes    Allergies as of 03/12/2020   No Known Allergies     Medication List       Accurate as of March 12, 2020  5:42 PM. If you have any questions, ask your nurse or doctor.        aspirin 325 MG tablet Take 325 mg by mouth.   Azelastine HCl 0.15 % Soln Place 1 spray into both nostrils daily.   carvedilol 25 MG tablet Commonly known as: COREG Take 1 tablet (25 mg total) by mouth 2 (two) times daily.   cetirizine 10 MG tablet Commonly known as: ZYRTEC Take 0.5 tablets (5 mg total) by  mouth at bedtime. Started by: Howard Pouch, DO   digoxin 0.125 MG tablet Commonly known as: LANOXIN Three times a week   Entresto 49-51 MG Generic drug: sacubitril-valsartan Take 1 tablet by mouth 2 (two) times daily.   ezetimibe-simvastatin 10-40 MG tablet Commonly known as: VYTORIN Take 1 tablet by mouth daily.   fluticasone 50 MCG/ACT nasal spray Commonly known as: FLONASE Place 1 spray into both nostrils 2 (two) times daily as needed for allergies or rhinitis.   FreeStyle Freedom Lite w/Device Kit USE TO CHECK BLOOD SUGAR AS DIRECTED   freestyle lancets USE AS INSTRUCTED   FREESTYLE LITE test strip Generic drug: glucose blood USE AS INSTRUCTED   furosemide 40 MG tablet Commonly known as: LASIX Take 1 tablet (40 mg total) by mouth 2 (two) times daily.   isosorbide mononitrate  30 MG 24 hr tablet Commonly known as: IMDUR Take 1 tablet (30 mg total) by mouth daily.   Lantus SoloStar 100 UNIT/ML Solostar Pen Generic drug: insulin glargine INJECT 72 UNITS UNDER THE SKIN DAILY or as directed   nitroGLYCERIN 0.4 MG SL tablet Commonly known as: NITROSTAT Place 0.4 mg under the tongue.   NovoLOG FlexPen 100 UNIT/ML FlexPen Generic drug: insulin aspart INJECT 30 UNITS UNDER THE SKIN THREE TIMES A DAY WITH MEALS   pantoprazole 40 MG tablet Commonly known as: PROTONIX Take 1 tablet (40 mg total) by mouth 2 (two) times daily.   potassium chloride SA 20 MEQ tablet Commonly known as: KLOR-CON 1 tab p.o. with Lasix use.   Sure Comfort Pen Needles 31G X 5 MM Misc Generic drug: Insulin Pen Needle USE 1 PEN NEEDLE TO INJECT INTO THE SKIN THREE TIMES A DAY       All past medical history, surgical history, allergies, family history, immunizations andmedications were updated in the EMR today and reviewed under the history and medication portions of their EMR.     No results found.   ROS: 14 pt review of systems performed and negative (unless mentioned in an  HPI)  Objective: BP (!) 113/58 (BP Location: Right Arm, Patient Position: Sitting, Cuff Size: Large)   Pulse 95   Temp 98 F (36.7 C) (Temporal)   Resp 17   Ht 6' (1.829 m)   Wt 245 lb 2 oz (111.2 kg)   SpO2 97%   BMI 33.24 kg/m  Gen: Afebrile. No acute distress.  HENT: AT. [place].  Bilateral nares without erythema or drainage, mild swelling present bilaterally. Eyes:Pupils Equal Round Reactive to light, Extraocular movements intact,  Conjunctiva without redness, discharge or icterus. CV: RRR no murmur, no edema, +2/4 P posterior tibialis pulses Chest: CTAB, no wheeze or crackles Abd: Soft.  Obese. NTND. BS present.  No masses palpated.  Skin: No rashes, purpura or petechiae.  Neuro:  Normal gait. PERLA. EOMi. Alert. Oriented x3  Psych: Normal affect, dress and demeanor. Normal speech. Normal thought content and judgment.   Assessment/plan: [name] is a 79 y.o. male present for TOC Gastroesophageal reflux disease without esophagitis Stable Continue Protonix 40 mg twice daily. Monitor B12, mag, vitamin D every 3 years> collected today  Type 2 DM with CKD stage 3 and hypertension (HCC)/Morbid Obesity Stable. Cardiac team recommended trial of Jardiance to provide patient with added cardiac benefits.  Patient's renal function is a contraindication to start/initiation of Jardiance. Continue to monitor blood sugars. Hold insulin if glucose less than 85 before meals for now. Continue Lantus 60 units. Continue NovoLog 30 units 3 times daily. PNA series: Completed Flu shot: Up-to-date 2020 (recommneded yearly) Foot exam: Up-to-date 05/2019 Microalbumin: Up-to-date 05/2019 Eye exam: Up-to-date 05/2019 Dr. Joaquim Lai A1c: 6.4 >7.0 Follow-up every 3-4 months.  HFrEF/CAD/Cardiac defibrillator in place/HTN/HLD-associated with DM/thrombocytopenia Stable CBC collected today Continue routine follow-ups with cardiology> last seen March 2021 with stable report. Entresto prescribed  by cardiology team. Continue Imdur 30 mg daily. Continue Lasix 40 mg twice daily Continue K. Dur 20 mEq daily with Lasix Continue Coreg 25 mg twice daily Continue Vytorin 10-40 daily Continue digoxin 0.125 mg 3 times a week> level checked March 2021 and in normal range Continue aspirin 325 mg daily  Stage 3b chronic kidney disease Patient is established with nephrology.  Continue routine follow-ups (pt reports yearly only). Avoid all NSAIDs. Renal panel encouraged every 4 to 6 months> collected today PTH/calcium/vitamin  D every 6-12 months> collected today Renally dose medications when appropriate  Allergic rhinitis: Continue Flonase> could consider Nasacort in place of Flonase if added needed Continue Astelin Added Zyrtec 5 mg nightly (renally dosed)  No follow-ups on file. Orders Placed This Encounter  Procedures  . CBC w/Diff  . Renal Function Panel  . PTH, Intact and Calcium  . Vitamin D (25 hydroxy)  . B12  . Magnesium  . POCT glycosylated hemoglobin (Hb A1C)   Meds ordered this encounter  Medications  . cetirizine (ZYRTEC) 10 MG tablet    Sig: Take 0.5 tablets (5 mg total) by mouth at bedtime.    Dispense:  45 tablet    Refill:  1  . Azelastine HCl 0.15 % SOLN    Sig: Place 1 spray into both nostrils daily.    Dispense:  90 mL    Refill:  3  . fluticasone (FLONASE) 50 MCG/ACT nasal spray    Sig: Place 1 spray into both nostrils 2 (two) times daily as needed for allergies or rhinitis.    Dispense:  48 g    Refill:  1  . furosemide (LASIX) 40 MG tablet    Sig: Take 1 tablet (40 mg total) by mouth 2 (two) times daily.    Dispense:  180 tablet    Refill:  1  . isosorbide mononitrate (IMDUR) 30 MG 24 hr tablet    Sig: Take 1 tablet (30 mg total) by mouth daily.    Dispense:  90 tablet    Refill:  1    DC prior scripts change in PCP  . carvedilol (COREG) 25 MG tablet    Sig: Take 1 tablet (25 mg total) by mouth 2 (two) times daily.    Dispense:  180 tablet     Refill:  1    Please see prior [facility], change in PCP  . insulin glargine (LANTUS SOLOSTAR) 100 UNIT/ML Solostar Pen    Sig: INJECT 72 UNITS UNDER THE SKIN DAILY or as directed    Dispense:  60 mL    Refill:  1    DC prior prescription change in PCP  . insulin aspart (NOVOLOG FLEXPEN) 100 UNIT/ML FlexPen    Sig: INJECT 30 UNITS UNDER THE SKIN THREE TIMES A DAY WITH MEALS    Dispense:  105 mL    Refill:  3  . ezetimibe-simvastatin (VYTORIN) 10-40 MG tablet    Sig: Take 1 tablet by mouth daily.    Dispense:  90 tablet    Refill:  1  . digoxin (LANOXIN) 0.125 MG tablet    Sig: Three times a week    Dispense:  40 tablet    Refill:  2    Note is dictated utilizing voice recognition software. Although note has been proof read prior to signing, occasional typographical errors still can be missed. If any questions arise, please do not hesitate to call for verification.  Electronically signed by: Howard Pouch, DO [place]

## 2020-03-14 ENCOUNTER — Encounter: Payer: Self-pay | Admitting: Family Medicine

## 2020-03-14 ENCOUNTER — Telehealth: Payer: Self-pay | Admitting: Family Medicine

## 2020-03-14 LAB — PTH, INTACT AND CALCIUM
Calcium: 8.6 mg/dL (ref 8.6–10.3)
PTH: 65 pg/mL — ABNORMAL HIGH (ref 14–64)

## 2020-03-14 MED ORDER — CHOLECALCIFEROL 10 MCG (400 UNIT) PO TABS: 400.0000 [IU] | ORAL_TABLET | Freq: Every day | ORAL | 1 refills | Status: DC

## 2020-03-14 MED ORDER — VITAMIN B 12 500 MCG PO TABS: 500.0000 ug | ORAL_TABLET | Freq: Every day | ORAL | 1 refills | Status: DC

## 2020-03-14 NOTE — Telephone Encounter (Signed)
Please inform patient the following information: His blood cell counts are stable. He has a low vitamin D, B12 and magnesium levels. All the above is more than likely from his chronic use of higher dose Protonix which can decrease the absorption of all three.  I have called in a vitamin d daily supplement to take with food. I have called in a daily B12 supplement. To his local pharmacy.    Magnesium supplementation in a person with decreased kidney function has to precede with more caution. His kidney function has been slowly declining (more so than prior) over the last year. I would encourage him to make sure he has follow-up scheduled with his nephrologist/kidney doctor to discuss ASAP.  Please forward his labs to his nephrologist as well.   F/u here in 3-4 mos.

## 2020-03-14 NOTE — Telephone Encounter (Signed)
Pt was called and lab results/instructions were given to patient, he verbalized understanding. Labs were faxed to Dr Antionette Fairy. Pts F/U appt was made and he stated he has appt with kidney specialist in a month

## 2020-03-15 ENCOUNTER — Encounter: Payer: Self-pay | Admitting: Family Medicine

## 2020-04-02 ENCOUNTER — Encounter: Payer: Self-pay | Admitting: Family Medicine

## 2020-04-02 ENCOUNTER — Other Ambulatory Visit: Payer: Self-pay

## 2020-04-02 ENCOUNTER — Telehealth (INDEPENDENT_AMBULATORY_CARE_PROVIDER_SITE_OTHER): Payer: Medicare Other | Admitting: Family Medicine

## 2020-04-02 VITALS — BP 112/60 | Ht 72.0 in

## 2020-04-02 DIAGNOSIS — J329 Chronic sinusitis, unspecified: Secondary | ICD-10-CM

## 2020-04-02 DIAGNOSIS — B9689 Other specified bacterial agents as the cause of diseases classified elsewhere: Secondary | ICD-10-CM | POA: Diagnosis not present

## 2020-04-02 MED ORDER — DM-GUAIFENESIN ER 30-600 MG PO TB12: 1.0000 | ORAL_TABLET | Freq: Two times a day (BID) | ORAL | 0 refills | Status: DC

## 2020-04-02 MED ORDER — BENZONATATE 100 MG PO CAPS: 100.0000 mg | ORAL_CAPSULE | Freq: Two times a day (BID) | ORAL | 0 refills | Status: DC | PRN

## 2020-04-02 MED ORDER — IPRATROPIUM BROMIDE 0.03 % NA SOLN: 2.0000 | Freq: Two times a day (BID) | NASAL | 0 refills | Status: DC | PRN

## 2020-04-02 MED ORDER — DOXYCYCLINE HYCLATE 100 MG PO TABS: 100.0000 mg | ORAL_TABLET | Freq: Two times a day (BID) | ORAL | 0 refills | Status: DC

## 2020-04-02 NOTE — Progress Notes (Addendum)
Telephone VISIT   I connected with [name] on 04/02/20 at 11:30 AM EDT by telephone and verified that I am speaking with the correct person using two identifiers. Location patient: Home Location provider: [facility], Office Persons participating in the virtual visit: Patient, Dr. Raoul Pitch and R.Baker, LPN  I discussed the limitations of evaluation and management by telemedicine and the availability of in person appointments. The patient expressed understanding and agreed to proceed. Patient was unable to schedule a video visit, he does not have a smart phone.   SUBJECTIVE Chief Complaint  Patient presents with  . Nasal Congestion    x2 weeks. Denies fever or SOB.   Marland Kitchen Cough  . Facial Pain    HPI: [name] is a 79 y.o. male present for sinus infection. Patient reports he has had nasal congestion for 2 weeks. Over the last few days he has developed a cough secondary to PND and drainage. He endorses right maxillary sinus pressure. He denies fever, chills, nausea, vomit, shortness of breath or wheezing. He has been fully vaccinated with Pfizer covid vaccine series > 4 weeks ago.   ROS: See pertinent positives and negatives per HPI.  Patient Active Problem List   Diagnosis Date Noted  . Chronic kidney disease (CKD), stage III (moderate) 12/18/2019  . Morbid obesity ([place]) 12/18/2019  . History of MI (myocardial infarction) 12/18/2019  . Thrombocytopenia ([place])   . Degeneration of lumbar or lumbosacral intervertebral disc 05/17/2019  . Actinic keratosis 10/14/2018  . Allergic rhinitis 09/28/2017  . Cardiac defibrillator in place 09/28/2017  . CAD (coronary artery disease), native coronary artery 09/28/2017  . Hyperlipidemia associated with type 2 diabetes mellitus ([place]) 09/28/2017  . GERD (gastroesophageal reflux disease) 09/28/2017  . Type 2 diabetes mellitus with stage 3 chronic kidney disease and hypertension ([place]) 01/04/2015  . Enthesopathy of hip region  01/04/2015  . Hypertension associated with diabetes ([place]) 09/04/2013  . 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 Systolic and diastolic CHF, chronic ([place]) 08/26/2011    Social History   Tobacco Use  . Smoking status: Former Research scientist (life sciences)  . Smokeless tobacco: Never Used  . Tobacco comment: remote at 20;  Substance Use Topics  . Alcohol use: No    Current Outpatient Medications:  .  aspirin 325 MG tablet, Take 325 mg by mouth., Disp: , Rfl:  .  Azelastine HCl 0.15 % SOLN, Place 1 spray into both nostrils daily., Disp: 90 mL, Rfl: 3 .  Blood Glucose Monitoring Suppl (FREESTYLE FREEDOM LITE) w/Device KIT, USE TO CHECK BLOOD SUGAR AS DIRECTED, Disp: 1 each, Rfl: 0 .  carvedilol (COREG) 25 MG tablet, Take 1 tablet (25 mg total) by mouth 2 (two) times daily., Disp: 180 tablet, Rfl: 1 .  cetirizine (ZYRTEC) 10 MG tablet, Take 0.5 tablets (5 mg total) by mouth at bedtime., Disp: 45 tablet, Rfl: 1 .  cholecalciferol (VITAMIN D3) 10 MCG (400 UNIT) TABS tablet, Take 1 tablet (400 Units total) by mouth daily., Disp: 90 tablet, Rfl: 1 .  Cyanocobalamin (VITAMIN B-12) 500 MCG TABS, Take 500 mcg by mouth daily., Disp: 90 tablet, Rfl: 1 .  digoxin (LANOXIN) 0.125 MG tablet, Three times a week, Disp: 40 tablet, Rfl: 2 .  ezetimibe-simvastatin (VYTORIN) 10-40 MG tablet, Take 1 tablet by mouth daily., Disp: 90 tablet, Rfl: 1 .  fluticasone (FLONASE) 50 MCG/ACT nasal spray, Place 1 spray into both nostrils 2 (two) times daily as needed for allergies  or rhinitis., Disp: 48 g, Rfl: 1 .  furosemide (LASIX) 40 MG tablet, Take 1 tablet (40 mg total) by mouth 2 (two) times daily., Disp: 180 tablet, Rfl: 1 .  glucose blood (FREESTYLE LITE) test strip, USE AS INSTRUCTED, Disp: 300 each, Rfl: 3 .  insulin aspart (NOVOLOG FLEXPEN) 100 UNIT/ML FlexPen, INJECT 30 UNITS UNDER THE SKIN THREE TIMES A DAY WITH MEALS, Disp: 105 mL, Rfl: 3 .  insulin glargine (LANTUS SOLOSTAR) 100 UNIT/ML Solostar Pen, INJECT 72 UNITS  UNDER THE SKIN DAILY or as directed, Disp: 60 mL, Rfl: 1 .  isosorbide mononitrate (IMDUR) 30 MG 24 hr tablet, Take 1 tablet (30 mg total) by mouth daily., Disp: 90 tablet, Rfl: 1 .  Lancets (FREESTYLE) lancets, USE AS INSTRUCTED, Disp: 400 each, Rfl: 3 .  nitroGLYCERIN (NITROSTAT) 0.4 MG SL tablet, Place 0.4 mg under the tongue., Disp: , Rfl:  .  pantoprazole (PROTONIX) 40 MG tablet, Take 1 tablet (40 mg total) by mouth 2 (two) times daily., Disp: 180 tablet, Rfl: 3 .  potassium chloride SA (KLOR-CON) 20 MEQ tablet, 1 tab p.o. with Lasix use., Disp: 90 tablet, Rfl: 4 .  sacubitril-valsartan (ENTRESTO) 49-51 MG, Take 1 tablet by mouth 2 (two) times daily., Disp: , Rfl:  .  SURE COMFORT PEN NEEDLES 31G X 5 MM MISC, USE 1 PEN NEEDLE TO INJECT INTO THE SKIN THREE TIMES A DAY, Disp: 400 each, Rfl: 3 .  benzonatate (TESSALON) 100 MG capsule, Take 1 capsule (100 mg total) by mouth 2 (two) times daily as needed for cough., Disp: 20 capsule, Rfl: 0 .  dextromethorphan-guaiFENesin (MUCINEX DM) 30-600 MG 12hr tablet, Take 1 tablet by mouth 2 (two) times daily., Disp: 30 tablet, Rfl: 0 .  doxycycline (VIBRA-TABS) 100 MG tablet, Take 1 tablet (100 mg total) by mouth 2 (two) times daily., Disp: 20 tablet, Rfl: 0 .  ipratropium (ATROVENT) 0.03 % nasal spray, Place 2 sprays into both nostrils every 12 (twelve) hours as needed for rhinitis., Disp: 30 mL, Rfl: 0  No Known Allergies  OBJECTIVE: BP 112/60   Ht 6' (1.829 m)   BMI 33.24 kg/m  Gen: No acute distress.  Chest: Cough or shortness of breath not present.  Neuro:Alert. Oriented x3   ASSESSMENT AND PLAN: [name] is a 79 y.o. male present for  Bacterial sinusitis Rest, hydrate.  Continue current allergy regimen.  Start mucinex DM Start Atrovent nasal spray BID PRN Tessalon perles BID PRN Doxy BID x10 d prescribed, take until completed.  If cough present it can last up to 6-8 weeks.  F/U 2 weeks of not improved.   [place],  DO 04/02/2020  14 minutes spent with patient today.   No follow-ups on file.  No orders of the defined types were placed in this encounter.  Meds ordered this encounter  Medications  . doxycycline (VIBRA-TABS) 100 MG tablet    Sig: Take 1 tablet (100 mg total) by mouth 2 (two) times daily.    Dispense:  20 tablet    Refill:  0  . dextromethorphan-guaiFENesin (MUCINEX DM) 30-600 MG 12hr tablet    Sig: Take 1 tablet by mouth 2 (two) times daily.    Dispense:  30 tablet    Refill:  0  . ipratropium (ATROVENT) 0.03 % nasal spray    Sig: Place 2 sprays into both nostrils every 12 (twelve) hours as needed for rhinitis.    Dispense:  30 mL    Refill:  0  .  benzonatate (TESSALON) 100 MG capsule    Sig: Take 1 capsule (100 mg total) by mouth 2 (two) times daily as needed for cough.    Dispense:  20 capsule    Refill:  0   Referral Orders  No referral(s) requested today

## 2020-04-02 NOTE — Addendum Note (Signed)
Addended by: Howard Pouch A on: 04/02/2020 12:32 PM   Modules accepted: Orders

## 2020-04-06 DIAGNOSIS — R0989 Other specified symptoms and signs involving the circulatory and respiratory systems: Secondary | ICD-10-CM | POA: Diagnosis not present

## 2020-04-06 DIAGNOSIS — M7122 Synovial cyst of popliteal space [Baker], left knee: Secondary | ICD-10-CM | POA: Diagnosis not present

## 2020-04-06 DIAGNOSIS — J189 Pneumonia, unspecified organism: Secondary | ICD-10-CM | POA: Diagnosis not present

## 2020-04-06 DIAGNOSIS — R61 Generalized hyperhidrosis: Secondary | ICD-10-CM | POA: Diagnosis not present

## 2020-04-06 DIAGNOSIS — I5043 Acute on chronic combined systolic (congestive) and diastolic (congestive) heart failure: Secondary | ICD-10-CM | POA: Diagnosis not present

## 2020-04-06 DIAGNOSIS — I130 Hypertensive heart and chronic kidney disease with heart failure and stage 1 through stage 4 chronic kidney disease, or unspecified chronic kidney disease: Secondary | ICD-10-CM | POA: Diagnosis not present

## 2020-04-06 DIAGNOSIS — R7989 Other specified abnormal findings of blood chemistry: Secondary | ICD-10-CM | POA: Diagnosis not present

## 2020-04-06 DIAGNOSIS — Z794 Long term (current) use of insulin: Secondary | ICD-10-CM | POA: Diagnosis not present

## 2020-04-06 DIAGNOSIS — J329 Chronic sinusitis, unspecified: Secondary | ICD-10-CM | POA: Diagnosis not present

## 2020-04-06 DIAGNOSIS — J9601 Acute respiratory failure with hypoxia: Secondary | ICD-10-CM | POA: Diagnosis not present

## 2020-04-06 DIAGNOSIS — R0603 Acute respiratory distress: Secondary | ICD-10-CM | POA: Diagnosis not present

## 2020-04-06 DIAGNOSIS — E1122 Type 2 diabetes mellitus with diabetic chronic kidney disease: Secondary | ICD-10-CM | POA: Diagnosis not present

## 2020-04-06 DIAGNOSIS — M7121 Synovial cyst of popliteal space [Baker], right knee: Secondary | ICD-10-CM | POA: Diagnosis not present

## 2020-04-06 DIAGNOSIS — N1832 Chronic kidney disease, stage 3b: Secondary | ICD-10-CM | POA: Diagnosis not present

## 2020-04-06 DIAGNOSIS — I2510 Atherosclerotic heart disease of native coronary artery without angina pectoris: Secondary | ICD-10-CM | POA: Diagnosis not present

## 2020-04-06 DIAGNOSIS — I5023 Acute on chronic systolic (congestive) heart failure: Secondary | ICD-10-CM | POA: Diagnosis not present

## 2020-04-06 DIAGNOSIS — J811 Chronic pulmonary edema: Secondary | ICD-10-CM | POA: Diagnosis not present

## 2020-04-06 DIAGNOSIS — R0602 Shortness of breath: Secondary | ICD-10-CM | POA: Diagnosis not present

## 2020-04-06 DIAGNOSIS — R918 Other nonspecific abnormal finding of lung field: Secondary | ICD-10-CM | POA: Diagnosis not present

## 2020-04-06 DIAGNOSIS — Z87891 Personal history of nicotine dependence: Secondary | ICD-10-CM | POA: Diagnosis not present

## 2020-04-06 DIAGNOSIS — Z20822 Contact with and (suspected) exposure to covid-19: Secondary | ICD-10-CM | POA: Diagnosis not present

## 2020-04-06 DIAGNOSIS — Z6832 Body mass index (BMI) 32.0-32.9, adult: Secondary | ICD-10-CM | POA: Diagnosis not present

## 2020-04-06 DIAGNOSIS — I38 Endocarditis, valve unspecified: Secondary | ICD-10-CM | POA: Diagnosis not present

## 2020-04-06 DIAGNOSIS — I110 Hypertensive heart disease with heart failure: Secondary | ICD-10-CM | POA: Diagnosis not present

## 2020-04-07 DIAGNOSIS — M7121 Synovial cyst of popliteal space [Baker], right knee: Secondary | ICD-10-CM | POA: Diagnosis not present

## 2020-04-07 DIAGNOSIS — M7122 Synovial cyst of popliteal space [Baker], left knee: Secondary | ICD-10-CM | POA: Diagnosis not present

## 2020-04-07 DIAGNOSIS — I5043 Acute on chronic combined systolic (congestive) and diastolic (congestive) heart failure: Secondary | ICD-10-CM | POA: Diagnosis not present

## 2020-04-07 DIAGNOSIS — I340 Nonrheumatic mitral (valve) insufficiency: Secondary | ICD-10-CM | POA: Diagnosis not present

## 2020-04-08 DIAGNOSIS — Z87891 Personal history of nicotine dependence: Secondary | ICD-10-CM | POA: Diagnosis not present

## 2020-04-08 DIAGNOSIS — B9689 Other specified bacterial agents as the cause of diseases classified elsewhere: Secondary | ICD-10-CM | POA: Diagnosis present

## 2020-04-08 DIAGNOSIS — J9601 Acute respiratory failure with hypoxia: Secondary | ICD-10-CM | POA: Diagnosis present

## 2020-04-08 DIAGNOSIS — E785 Hyperlipidemia, unspecified: Secondary | ICD-10-CM | POA: Diagnosis present

## 2020-04-08 DIAGNOSIS — Z95810 Presence of automatic (implantable) cardiac defibrillator: Secondary | ICD-10-CM | POA: Diagnosis not present

## 2020-04-08 DIAGNOSIS — I5043 Acute on chronic combined systolic (congestive) and diastolic (congestive) heart failure: Secondary | ICD-10-CM | POA: Diagnosis present

## 2020-04-08 DIAGNOSIS — J329 Chronic sinusitis, unspecified: Secondary | ICD-10-CM | POA: Diagnosis present

## 2020-04-08 DIAGNOSIS — I517 Cardiomegaly: Secondary | ICD-10-CM | POA: Diagnosis not present

## 2020-04-08 DIAGNOSIS — Z6832 Body mass index (BMI) 32.0-32.9, adult: Secondary | ICD-10-CM | POA: Diagnosis not present

## 2020-04-08 DIAGNOSIS — E6609 Other obesity due to excess calories: Secondary | ICD-10-CM | POA: Diagnosis present

## 2020-04-08 DIAGNOSIS — I2510 Atherosclerotic heart disease of native coronary artery without angina pectoris: Secondary | ICD-10-CM | POA: Diagnosis present

## 2020-04-08 DIAGNOSIS — N1832 Chronic kidney disease, stage 3b: Secondary | ICD-10-CM | POA: Diagnosis present

## 2020-04-08 DIAGNOSIS — R0603 Acute respiratory distress: Secondary | ICD-10-CM | POA: Diagnosis not present

## 2020-04-08 DIAGNOSIS — R0602 Shortness of breath: Secondary | ICD-10-CM | POA: Diagnosis not present

## 2020-04-08 DIAGNOSIS — K219 Gastro-esophageal reflux disease without esophagitis: Secondary | ICD-10-CM | POA: Diagnosis present

## 2020-04-08 DIAGNOSIS — Z79899 Other long term (current) drug therapy: Secondary | ICD-10-CM | POA: Diagnosis not present

## 2020-04-08 DIAGNOSIS — R918 Other nonspecific abnormal finding of lung field: Secondary | ICD-10-CM | POA: Diagnosis not present

## 2020-04-08 DIAGNOSIS — I340 Nonrheumatic mitral (valve) insufficiency: Secondary | ICD-10-CM | POA: Diagnosis present

## 2020-04-08 DIAGNOSIS — Z20822 Contact with and (suspected) exposure to covid-19: Secondary | ICD-10-CM | POA: Diagnosis present

## 2020-04-08 DIAGNOSIS — I252 Old myocardial infarction: Secondary | ICD-10-CM | POA: Diagnosis not present

## 2020-04-08 DIAGNOSIS — K5900 Constipation, unspecified: Secondary | ICD-10-CM | POA: Diagnosis present

## 2020-04-08 DIAGNOSIS — Z794 Long term (current) use of insulin: Secondary | ICD-10-CM | POA: Diagnosis not present

## 2020-04-08 DIAGNOSIS — I5023 Acute on chronic systolic (congestive) heart failure: Secondary | ICD-10-CM | POA: Diagnosis not present

## 2020-04-08 DIAGNOSIS — Z7982 Long term (current) use of aspirin: Secondary | ICD-10-CM | POA: Diagnosis not present

## 2020-04-08 DIAGNOSIS — E1122 Type 2 diabetes mellitus with diabetic chronic kidney disease: Secondary | ICD-10-CM | POA: Diagnosis present

## 2020-04-08 DIAGNOSIS — I255 Ischemic cardiomyopathy: Secondary | ICD-10-CM | POA: Diagnosis present

## 2020-04-08 DIAGNOSIS — I130 Hypertensive heart and chronic kidney disease with heart failure and stage 1 through stage 4 chronic kidney disease, or unspecified chronic kidney disease: Secondary | ICD-10-CM | POA: Diagnosis present

## 2020-04-10 MED ORDER — FLUTICASONE PROPIONATE 50 MCG/ACT NA SUSP
2.00 | NASAL | Status: DC
Start: ? — End: 2020-04-10

## 2020-04-10 MED ORDER — SODIUM CHLORIDE 0.9 % IV SOLN
10.00 | INTRAVENOUS | Status: DC
Start: ? — End: 2020-04-10

## 2020-04-10 MED ORDER — CYANOCOBALAMIN 1000 MCG PO TABS
1000.00 | ORAL_TABLET | ORAL | Status: DC
Start: 2020-04-11 — End: 2020-04-10

## 2020-04-10 MED ORDER — POTASSIUM CHLORIDE CRYS ER 20 MEQ PO TBCR
20.00 | EXTENDED_RELEASE_TABLET | ORAL | Status: DC
Start: 2020-04-11 — End: 2020-04-10

## 2020-04-10 MED ORDER — INSULIN LISPRO 100 UNIT/ML ~~LOC~~ SOLN
1.00 | SUBCUTANEOUS | Status: DC
Start: 2020-04-10 — End: 2020-04-10

## 2020-04-10 MED ORDER — HEPARIN SODIUM (PORCINE) 5000 UNIT/ML IJ SOLN
5000.00 | INTRAMUSCULAR | Status: DC
Start: 2020-04-10 — End: 2020-04-10

## 2020-04-10 MED ORDER — GENERIC EXTERNAL MEDICATION
Status: DC
Start: ? — End: 2020-04-10

## 2020-04-10 MED ORDER — PANTOPRAZOLE SODIUM 40 MG PO TBEC
40.00 | DELAYED_RELEASE_TABLET | ORAL | Status: DC
Start: 2020-04-11 — End: 2020-04-10

## 2020-04-10 MED ORDER — CHLORHEXIDINE GLUCONATE 0.12 % MT SOLN
15.00 | OROMUCOSAL | Status: DC
Start: 2020-04-10 — End: 2020-04-10

## 2020-04-10 MED ORDER — HYDROCODONE-ACETAMINOPHEN 5-325 MG PO TABS
1.00 | ORAL_TABLET | ORAL | Status: DC
Start: ? — End: 2020-04-10

## 2020-04-10 MED ORDER — HYDROCODONE-HOMATROPINE 5-1.5 MG/5ML PO SYRP
5.00 | ORAL_SOLUTION | ORAL | Status: DC
Start: ? — End: 2020-04-10

## 2020-04-10 MED ORDER — ASPIRIN 325 MG PO TABS
325.00 | ORAL_TABLET | ORAL | Status: DC
Start: 2020-04-11 — End: 2020-04-10

## 2020-04-10 MED ORDER — GENERIC EXTERNAL MEDICATION
Status: DC
Start: 2020-04-10 — End: 2020-04-10

## 2020-04-10 MED ORDER — FUROSEMIDE 40 MG PO TABS
40.00 | ORAL_TABLET | ORAL | Status: DC
Start: 2020-04-10 — End: 2020-04-10

## 2020-04-10 MED ORDER — HYDROCODONE-ACETAMINOPHEN 10-325 MG PO TABS
1.00 | ORAL_TABLET | ORAL | Status: DC
Start: ? — End: 2020-04-10

## 2020-04-10 MED ORDER — DSS 100 MG PO CAPS
100.00 | ORAL_CAPSULE | ORAL | Status: DC
Start: 2020-04-10 — End: 2020-04-10

## 2020-04-10 MED ORDER — DIGOXIN 125 MCG PO TABS
0.13 | ORAL_TABLET | ORAL | Status: DC
Start: 2020-04-12 — End: 2020-04-10

## 2020-04-10 MED ORDER — GENERIC EXTERNAL MEDICATION
250.00 | Status: DC
Start: 2020-04-10 — End: 2020-04-10

## 2020-04-10 MED ORDER — INSULIN GLARGINE 100 UNIT/ML ~~LOC~~ SOLN
1.00 | SUBCUTANEOUS | Status: DC
Start: 2020-04-10 — End: 2020-04-10

## 2020-04-10 MED ORDER — BISACODYL 10 MG RE SUPP
10.00 | RECTAL | Status: DC
Start: ? — End: 2020-04-10

## 2020-04-10 MED ORDER — CARVEDILOL 25 MG PO TABS
25.00 | ORAL_TABLET | ORAL | Status: DC
Start: 2020-04-10 — End: 2020-04-10

## 2020-04-10 MED ORDER — INSULIN LISPRO 100 UNIT/ML ~~LOC~~ SOLN
1.00 | SUBCUTANEOUS | Status: DC
Start: ? — End: 2020-04-10

## 2020-04-10 MED ORDER — DOXYCYCLINE MONOHYDRATE 100 MG PO CAPS
100.00 | ORAL_CAPSULE | ORAL | Status: DC
Start: 2020-04-10 — End: 2020-04-10

## 2020-04-10 MED ORDER — POLYETHYLENE GLYCOL 3350 17 GM/SCOOP PO POWD
17.00 | ORAL | Status: DC
Start: 2020-04-10 — End: 2020-04-10

## 2020-04-10 MED ORDER — LORATADINE 10 MG PO TABS
10.00 | ORAL_TABLET | ORAL | Status: DC
Start: 2020-04-11 — End: 2020-04-10

## 2020-04-10 MED ORDER — ISOSORBIDE MONONITRATE ER 30 MG PO TB24
30.00 | ORAL_TABLET | ORAL | Status: DC
Start: 2020-04-11 — End: 2020-04-10

## 2020-04-10 MED ORDER — HYDRALAZINE HCL 10 MG PO TABS
10.00 | ORAL_TABLET | ORAL | Status: DC
Start: ? — End: 2020-04-10

## 2020-04-10 MED ORDER — GUAIFENESIN 400 MG PO TABS
400.00 | ORAL_TABLET | ORAL | Status: DC
Start: 2020-04-10 — End: 2020-04-10

## 2020-04-10 MED ORDER — SACUBITRIL-VALSARTAN 49-51 MG PO TABS
1.00 | ORAL_TABLET | ORAL | Status: DC
Start: 2020-04-10 — End: 2020-04-10

## 2020-04-11 ENCOUNTER — Encounter: Payer: Self-pay | Admitting: Family Medicine

## 2020-04-11 MED ORDER — GENERIC EXTERNAL MEDICATION
Status: DC
Start: ? — End: 2020-04-11

## 2020-04-11 NOTE — Telephone Encounter (Signed)
Called and spoke with patients wife about appt and cardiology, also wrote pt my chart message

## 2020-04-17 DIAGNOSIS — I429 Cardiomyopathy, unspecified: Secondary | ICD-10-CM | POA: Diagnosis not present

## 2020-04-17 DIAGNOSIS — I5020 Unspecified systolic (congestive) heart failure: Secondary | ICD-10-CM | POA: Diagnosis not present

## 2020-04-19 DIAGNOSIS — D492 Neoplasm of unspecified behavior of bone, soft tissue, and skin: Secondary | ICD-10-CM | POA: Diagnosis not present

## 2020-04-19 DIAGNOSIS — L578 Other skin changes due to chronic exposure to nonionizing radiation: Secondary | ICD-10-CM | POA: Diagnosis not present

## 2020-04-19 DIAGNOSIS — L570 Actinic keratosis: Secondary | ICD-10-CM | POA: Diagnosis not present

## 2020-04-29 DIAGNOSIS — I429 Cardiomyopathy, unspecified: Secondary | ICD-10-CM | POA: Diagnosis not present

## 2020-05-08 DIAGNOSIS — N1831 Chronic kidney disease, stage 3a: Secondary | ICD-10-CM | POA: Diagnosis not present

## 2020-05-15 ENCOUNTER — Ambulatory Visit: Payer: Medicare Other | Admitting: Family Medicine

## 2020-05-28 DIAGNOSIS — I429 Cardiomyopathy, unspecified: Secondary | ICD-10-CM | POA: Diagnosis not present

## 2020-06-25 ENCOUNTER — Telehealth: Payer: Self-pay | Admitting: Family Medicine

## 2020-06-25 ENCOUNTER — Other Ambulatory Visit: Payer: Self-pay

## 2020-06-25 ENCOUNTER — Ambulatory Visit (INDEPENDENT_AMBULATORY_CARE_PROVIDER_SITE_OTHER): Payer: Medicare Other | Admitting: Family Medicine

## 2020-06-25 ENCOUNTER — Encounter: Payer: Self-pay | Admitting: Family Medicine

## 2020-06-25 VITALS — BP 113/61 | HR 72 | Temp 97.0°F | Ht 72.0 in | Wt 239.8 lb

## 2020-06-25 DIAGNOSIS — E559 Vitamin D deficiency, unspecified: Secondary | ICD-10-CM

## 2020-06-25 DIAGNOSIS — E1122 Type 2 diabetes mellitus with diabetic chronic kidney disease: Secondary | ICD-10-CM | POA: Diagnosis not present

## 2020-06-25 DIAGNOSIS — N183 Chronic kidney disease, stage 3 unspecified: Secondary | ICD-10-CM | POA: Diagnosis not present

## 2020-06-25 DIAGNOSIS — E538 Deficiency of other specified B group vitamins: Secondary | ICD-10-CM

## 2020-06-25 DIAGNOSIS — I129 Hypertensive chronic kidney disease with stage 1 through stage 4 chronic kidney disease, or unspecified chronic kidney disease: Secondary | ICD-10-CM | POA: Diagnosis not present

## 2020-06-25 DIAGNOSIS — N182 Chronic kidney disease, stage 2 (mild): Secondary | ICD-10-CM

## 2020-06-25 LAB — POCT GLYCOSYLATED HEMOGLOBIN (HGB A1C): Hemoglobin A1C: 7.3 % — AB (ref 4.0–5.6)

## 2020-06-25 LAB — VITAMIN B12: Vitamin B-12: 319 pg/mL (ref 211–911)

## 2020-06-25 LAB — VITAMIN D 25 HYDROXY (VIT D DEFICIENCY, FRACTURES): VITD: 32.54 ng/mL (ref 30.00–100.00)

## 2020-06-25 MED ORDER — NOVOLOG FLEXPEN 100 UNIT/ML ~~LOC~~ SOPN: PEN_INJECTOR | SUBCUTANEOUS | 3 refills | Status: AC

## 2020-06-25 MED ORDER — CARVEDILOL 25 MG PO TABS: 25.0000 mg | ORAL_TABLET | Freq: Two times a day (BID) | ORAL | 1 refills | Status: DC

## 2020-06-25 MED ORDER — ISOSORBIDE MONONITRATE ER 30 MG PO TB24: 30.0000 mg | ORAL_TABLET | Freq: Every day | ORAL | 1 refills | Status: DC

## 2020-06-25 MED ORDER — LANTUS SOLOSTAR 100 UNIT/ML ~~LOC~~ SOPN: PEN_INJECTOR | SUBCUTANEOUS | 1 refills | Status: AC

## 2020-06-25 MED ORDER — FUROSEMIDE 40 MG PO TABS: 40.0000 mg | ORAL_TABLET | Freq: Two times a day (BID) | ORAL | 1 refills | Status: DC

## 2020-06-25 MED ORDER — DIGOXIN 125 MCG PO TABS: ORAL_TABLET | ORAL | 2 refills | Status: DC

## 2020-06-25 MED ORDER — EZETIMIBE-SIMVASTATIN 10-40 MG PO TABS: 1.0000 | ORAL_TABLET | Freq: Every day | ORAL | 3 refills | Status: DC

## 2020-06-25 NOTE — Patient Instructions (Addendum)
Nice to see you today.  Your a1c is 7.3 today. Goal 7.0. If able to tolerate increase lantus to 59 unit a night.  Try to increase exercise and walking as tolerated. Diabetic diet encouraged.  BP looks great.   Eye exam due in September.    Follow up in 4 months on chronic conditions, sooner if needed.

## 2020-06-25 NOTE — Telephone Encounter (Signed)
Please call patient's pharmacy at [phone] and canceled the Coreg 25 mg twice daily prescription refilled yesterday.  I did cancel this in the computer however possibly already went through to his mail-in pharmacy.  It appears his cardiologist has refilled this very recently  and changed the dose, therefore cardiology will manage prescription.  Thank you.

## 2020-06-25 NOTE — Progress Notes (Signed)
Patient ID: [name], male  DOB: 05-09-1941, 79 y.o.   MRN: [id number] Patient Care Team    Relationship Specialty Notifications Start End  Ma Hillock, DO PCP - General Family Medicine  12/12/19   Bonney Aid, MD Referring Physician Cardiology  08/23/18   Regenia Skeeter, MD Referring Physician Nephrology  08/23/18   Crosby Oyster, MD Referring Physician Dermatology  08/23/18   Otelia Sergeant, OD Referring Physician Optometry  12/18/19     Chief Complaint  Patient presents with  . Diabetes   Subjective: [name] is a 79 y.o.  male present for [facility] follow up.  GERD: Patient reports his reflux is well controlled on current medication regimen Diabetes:Pt reports compliance with NovoLog 30 units 3 times daily before meals and Lantus 58 units daily. Patient denies dizziness, hyperglycemic or hypoglycemic events. Patient denies numbness, tingling in the extremities or nonhealing wounds of feet.  Pt reports FBG ranges fasting 85-130. Before meals 170-180 PNA series: Completed Flu shot: Up-to-date 2020 (recommneded yearly) Foot exam: Up-to-date 06/2020 Microalbumin: Up-to-date 05/2019> collected today Eye exam: Up-to-date 08/2019 Dr. Joaquim Lai A1c: 6.4> 7.0> 7.3 today  HTN/HLD/CHFrEF/defib in place/CAD/CKD 3: Pt reports complaince with Coreg 12.5 bid (Cardio refilled), digoxin 3 times a week, Lasix 40 mg BID, Imdur, simvastatin-ezetimibe, [name], spiro 12.5 mg qd (cardio) . Blood pressures ranges at home within normal limits. Patient denies chest pain, shortness of breath, dizziness or lower extremity edema.   Pt 325 daily baby ASA. Pt is  prescribed statin. UTD labs completed by cardio and nephrology.  Cardiology refills is [facility]. Patient has a history of ischemic cardiomyopathy, status post biventricular pacemaker, coronary artery disease with MI in 1993 and 2003.  Mitral regurg.   Echo 04/2020 EF 20-25%, LVIDd 6.8cm    Depression  screen [facility] 2/9 06/25/2020 08/15/2019 05/17/2019 12/06/2018 02/17/2018  Decreased Interest 0 0 0 0 0  Down, Depressed, Hopeless 0 0 0 0 0  PHQ - 2 Score 0 0 0 0 0  Altered sleeping 0 0 0 0 -  Tired, decreased energy 0 0 0 0 -  Change in appetite 0 0 0 0 -  Feeling bad or failure about yourself  0 0 0 0 -  Trouble concentrating 0 0 0 0 -  Moving slowly or fidgety/restless 0 0 0 0 -  Suicidal thoughts 0 0 0 0 -  PHQ-9 Score 0 0 0 0 -  Difficult doing work/chores Not difficult at all Not difficult at all Not difficult at all Not difficult at all -   No flowsheet data found.      Fall Risk  11/15/2019 05/17/2019 12/06/2018 02/17/2018 09/28/2017  Falls in the past year? 0 0 0 No No  Number falls in past yr: 0 - 0 - -  Injury with Fall? 0 - 0 - -  Follow up - Falls evaluation completed - - -     Immunization History  Administered Date(s) Administered  . Fluad Quad(high Dose 65+) 08/15/2019  . Influenza Split 10/11/2007, 08/14/2010, 09/08/2011  . Influenza, High Dose Seasonal PF 10/11/2007, 08/14/2010, 09/08/2011, 10/24/2015, 11/06/2016, 09/28/2017, 08/23/2018  . Influenza, Seasonal, Injecte, Preservative Fre 12/18/2013  . Influenza,inj,Quad PF,6+ Mos 12/18/2013  . PFIZER SARS-COV-2 Vaccination 12/28/2019, 02/01/2020  . Pneumococcal Conjugate-13 04/26/2015  . Pneumococcal Polysaccharide-23 04/06/2012  . Td 04/22/2006, 04/22/2006  . Tdap 04/26/2015  . Zoster 05/27/2015    No exam data present  Past Medical  History:  Diagnosis Date  . Abnormal echocardiogram 08/26/2011  . Abnormal nuclear stress test 08/26/2011  . Allergic rhinitis 09/28/2017  . Allergy   . 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 CAD (coronary artery disease), native coronary artery 09/28/2017  . Cardiac defibrillator in place 09/28/2017  . 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 Laurance Flatten  . Congestive heart failure ([place]) 09/28/2017  . DM  (diabetes mellitus), type 2 ([place]) 09/04/2013  . Enthesopathy of hip region 01/04/2015  . GERD (gastroesophageal reflux disease) 09/28/2017  . History of actinic keratoses 09/12/2015  . Hyperlipidemia 09/28/2017  . Hypertension associated with diabetes ([place]) 09/04/2013  . Mitral valve incompetence 09/28/2017  . Obesity (BMI 30-39.9) 05/12/2017  . 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 Andria Frames, Dr. Purcell Nails  . Systolic and diastolic CHF, chronic ([place]) 08/26/2011  . Thrombocytopenia ([place])    No Known Allergies Past Surgical History:  Procedure Laterality Date  . TONSILLECTOMY AND ADENOIDECTOMY Bilateral 1950   Family History  Problem Relation Age of Onset  . Diabetes Father   . Heart attack Father   . Lung cancer Father   . Hypertension Sister    Social History   Social History Narrative   Marital status/children/pets: Married.   Education/employment: High school graduate.  Retired.   Safety:      -smoke alarm in the home:Yes     - wears seatbelt: No     - Feels safe in their relationships: Yes    Allergies as of 06/25/2020   No Known Allergies     Medication List       Accurate as of June 25, 2020  8:12 AM. If you have any questions, ask your nurse or doctor.        STOP taking these medications   doxycycline 100 MG tablet Commonly known as: VIBRA-TABS Stopped by: Howard Pouch, DO     TAKE these medications   aspirin 325 MG tablet Take 325 mg by mouth.   Azelastine HCl 0.15 % Soln Place 1 spray into both nostrils daily.   benzonatate 100 MG capsule Commonly known as: TESSALON Take 1 capsule (100 mg total) by mouth 2 (two) times daily as needed for cough.   carvedilol 25 MG tablet Commonly known as: COREG Take 1 tablet (25 mg total) by mouth 2 (two) times daily.   cetirizine 10 MG tablet Commonly known as: ZYRTEC Take 0.5 tablets (5 mg total) by mouth at bedtime.   cholecalciferol 10 MCG (400 UNIT) Tabs tablet Commonly known as:  VITAMIN D3 Take 1 tablet (400 Units total) by mouth daily.   dextromethorphan-guaiFENesin 30-600 MG 12hr tablet Commonly known as: MUCINEX DM Take 1 tablet by mouth 2 (two) times daily.   digoxin 0.125 MG tablet Commonly known as: LANOXIN Three times a week   Entresto 49-51 MG Generic drug: sacubitril-valsartan Take 1 tablet by mouth 2 (two) times daily.   ezetimibe-simvastatin 10-40 MG tablet Commonly known as: VYTORIN Take 1 tablet by mouth daily.   fluticasone 50 MCG/ACT nasal spray Commonly known as: FLONASE Place 1 spray into both nostrils 2 (two) times daily as needed for allergies or rhinitis.   FreeStyle Freedom Lite w/Device Kit USE TO CHECK BLOOD SUGAR AS DIRECTED   freestyle lancets USE AS INSTRUCTED   FREESTYLE LITE test strip Generic drug: glucose blood USE AS INSTRUCTED   furosemide 40 MG tablet Commonly known as: LASIX Take  1 tablet (40 mg total) by mouth 2 (two) times daily.   ipratropium 0.03 % nasal spray Commonly known as: ATROVENT Place 2 sprays into both nostrils every 12 (twelve) hours as needed for rhinitis.   isosorbide mononitrate 30 MG 24 hr tablet Commonly known as: IMDUR Take 1 tablet (30 mg total) by mouth daily.   Lantus SoloStar 100 UNIT/ML Solostar Pen Generic drug: insulin glargine INJECT 72 UNITS UNDER THE SKIN DAILY or as directed   nitroGLYCERIN 0.4 MG SL tablet Commonly known as: NITROSTAT Place 0.4 mg under the tongue.   NovoLOG FlexPen 100 UNIT/ML FlexPen Generic drug: insulin aspart INJECT 30 UNITS UNDER THE SKIN THREE TIMES A DAY WITH MEALS   pantoprazole 40 MG tablet Commonly known as: PROTONIX Take 1 tablet (40 mg total) by mouth 2 (two) times daily.   potassium chloride SA 20 MEQ tablet Commonly known as: KLOR-CON 1 tab p.o. with Lasix use.   Sure Comfort Pen Needles 31G X 5 MM Misc Generic drug: Insulin Pen Needle USE 1 PEN NEEDLE TO INJECT INTO THE SKIN THREE TIMES A DAY   Vitamin B 12 500 MCG  Tabs Take 500 mcg by mouth daily.       All past medical history, surgical history, allergies, family history, immunizations andmedications were updated in the EMR today and reviewed under the history and medication portions of their EMR.     No results found.   ROS: 14 pt review of systems performed and negative (unless mentioned in an HPI)  Objective: BP 113/61   Pulse 72   Temp (!) 97 F (36.1 C) (Temporal)   Ht 6' (1.829 m)   Wt 239 lb 12.8 oz (108.8 kg)   SpO2 97%   BMI 32.52 kg/m  Gen: Afebrile. No acute distress.  HENT: AT. [place].  Eyes:Pupils Equal Round Reactive to light, Extraocular movements intact,  Conjunctiva without redness, discharge or icterus. Neck/lymp/endocrine: Supple, no lymphadenopathy, no thyromegaly CV: RRR no murmur, no edema, +2/4 P posterior tibialis pulses Chest: CTAB, no wheeze or crackles Abd: Soft. NTND. BS present.  Neuro:  Normal gait. PERLA. EOMi. Alert. Oriented x3  Psych: Normal affect, dress and demeanor. Normal speech. Normal thought content and judgment. Diabetic Foot Exam - Simple   Simple Foot Form Diabetic Foot exam was performed with the following findings: Yes 06/25/2020  8:00 AM  Visual Inspection No deformities, no ulcerations, no other skin breakdown bilaterally: Yes Sensation Testing Intact to touch and monofilament testing bilaterally: Yes Pulse Check Posterior Tibialis and Dorsalis pulse intact bilaterally: Yes Comments Thickened elongated nails bilateral feet     Results for orders placed or performed in visit on 06/25/20 (from the past 24 hour(s))  POCT glycosylated hemoglobin (Hb A1C)     Status: Abnormal   Collection Time: 06/25/20  8:08 AM  Result Value Ref Range   Hemoglobin A1C 7.3 (A) 4.0 - 5.6 %   HbA1c POC (<> result, manual entry)     HbA1c, POC (prediabetic range)     HbA1c, POC (controlled diabetic range)      Assessment/plan: [name] is a 79 y.o. male present for TOC Gastroesophageal reflux  disease without esophagitis/B12 insufficiency Stable Continue Protonix 40 mg twice daily. Monitor B12, mag, vitamin D every 3 years> check vitamin D and B12 today secondary to insufficiencies.  Magnesium is up-to-date.  Type 2 DM with CKD stage 3 and hypertension (HCC)/Morbid Obesity Stable. Goal ~7.0 (with age) Cardiac team recommended trial of Jardiance to provide  patient with added cardiac benefits.  Patient's renal function is a contraindication to start/initiation of Jardiance. Continue to monitor blood sugars. Hold insulin if glucose less than 85 before meals for now. Continue Lantus 58-60 units. Continue NovoLog 30 units 3 times daily. PNA series: Completed Flu shot: Up-to-date 2020 (recommneded yearly) Foot exam: Up-to-date 06/2020 Microalbumin: UTD collected 05/08/2020 at nephrology office. Eye exam: Up-to-date 08/2019 Dr. Joaquim Lai A1c: 6.4> 7.0> 7.3 today  HFrEF/CAD/Cardiac defibrillator in place/HTN/HLD-associated with DM/thrombocytopenia Stable. Continue routine follow-ups with cardiology Coreg, spironolactone and Entresto prescribed by cardiology team. Per cardiology note, he was discontinued from potassium supplementation when the spironolactone was added. Continue Imdur 30 mg daily. 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  Continue routine follow-ups./ Avoid all NSAIDs. Renal panel encouraged every 4 to 6 months> reviewed BMP collected 06/20/2020 at another office. PTH/calcium/vitamin D every 6-12 months> UTD.  Vitamin D collected today for recheck. Renally dose medications when appropriate   No follow-ups on file. Orders Placed This Encounter  Procedures  . POCT glycosylated hemoglobin (Hb A1C)   No orders of the defined  types were placed in this encounter.   Note is dictated utilizing voice recognition software. Although note has been proof read prior to signing, occasional typographical errors still can be missed. If any questions arise, please do not hesitate to call for verification.  Electronically signed by: Howard Pouch, DO [facility]

## 2020-06-26 NOTE — Telephone Encounter (Signed)
Called was on hold and then call dropped.

## 2020-07-02 NOTE — Telephone Encounter (Signed)
This RX was cancelled.

## 2020-07-03 DIAGNOSIS — I340 Nonrheumatic mitral (valve) insufficiency: Secondary | ICD-10-CM | POA: Diagnosis not present

## 2020-07-03 DIAGNOSIS — I442 Atrioventricular block, complete: Secondary | ICD-10-CM | POA: Diagnosis not present

## 2020-07-03 DIAGNOSIS — Z95810 Presence of automatic (implantable) cardiac defibrillator: Secondary | ICD-10-CM | POA: Diagnosis not present

## 2020-07-03 DIAGNOSIS — I429 Cardiomyopathy, unspecified: Secondary | ICD-10-CM | POA: Diagnosis not present

## 2020-07-03 DIAGNOSIS — I252 Old myocardial infarction: Secondary | ICD-10-CM | POA: Diagnosis not present

## 2020-08-07 ENCOUNTER — Other Ambulatory Visit: Payer: Self-pay | Admitting: Family Medicine

## 2020-08-27 DIAGNOSIS — I429 Cardiomyopathy, unspecified: Secondary | ICD-10-CM | POA: Diagnosis not present

## 2020-08-27 DIAGNOSIS — I5020 Unspecified systolic (congestive) heart failure: Secondary | ICD-10-CM | POA: Diagnosis not present

## 2020-08-28 ENCOUNTER — Other Ambulatory Visit: Payer: Self-pay | Admitting: Family Medicine

## 2020-09-09 DIAGNOSIS — E1122 Type 2 diabetes mellitus with diabetic chronic kidney disease: Secondary | ICD-10-CM | POA: Diagnosis not present

## 2020-09-09 DIAGNOSIS — Z23 Encounter for immunization: Secondary | ICD-10-CM | POA: Diagnosis not present

## 2020-09-09 DIAGNOSIS — E6609 Other obesity due to excess calories: Secondary | ICD-10-CM | POA: Diagnosis not present

## 2020-09-09 DIAGNOSIS — I5020 Unspecified systolic (congestive) heart failure: Secondary | ICD-10-CM | POA: Diagnosis not present

## 2020-09-09 DIAGNOSIS — K219 Gastro-esophageal reflux disease without esophagitis: Secondary | ICD-10-CM | POA: Diagnosis not present

## 2020-09-09 DIAGNOSIS — Z6832 Body mass index (BMI) 32.0-32.9, adult: Secondary | ICD-10-CM | POA: Diagnosis not present

## 2020-09-09 DIAGNOSIS — Z794 Long term (current) use of insulin: Secondary | ICD-10-CM | POA: Diagnosis not present

## 2020-09-09 DIAGNOSIS — I340 Nonrheumatic mitral (valve) insufficiency: Secondary | ICD-10-CM | POA: Diagnosis not present

## 2020-09-09 DIAGNOSIS — Z7689 Persons encountering health services in other specified circumstances: Secondary | ICD-10-CM | POA: Diagnosis not present

## 2020-09-09 DIAGNOSIS — N1832 Chronic kidney disease, stage 3b: Secondary | ICD-10-CM | POA: Diagnosis not present

## 2020-10-07 DIAGNOSIS — I429 Cardiomyopathy, unspecified: Secondary | ICD-10-CM | POA: Diagnosis not present

## 2020-10-16 ENCOUNTER — Other Ambulatory Visit: Payer: Self-pay | Admitting: Family Medicine

## 2020-11-21 DIAGNOSIS — J0100 Acute maxillary sinusitis, unspecified: Secondary | ICD-10-CM | POA: Diagnosis not present

## 2020-12-05 ENCOUNTER — Telehealth: Payer: Self-pay | Admitting: Family Medicine

## 2020-12-05 NOTE — Telephone Encounter (Signed)
Spoke with spouse she stated to call back around 1pm today

## 2020-12-12 DIAGNOSIS — I340 Nonrheumatic mitral (valve) insufficiency: Secondary | ICD-10-CM | POA: Diagnosis not present

## 2020-12-12 DIAGNOSIS — N1832 Chronic kidney disease, stage 3b: Secondary | ICD-10-CM | POA: Diagnosis not present

## 2020-12-12 DIAGNOSIS — I5043 Acute on chronic combined systolic (congestive) and diastolic (congestive) heart failure: Secondary | ICD-10-CM | POA: Diagnosis not present

## 2020-12-12 DIAGNOSIS — I213 ST elevation (STEMI) myocardial infarction of unspecified site: Secondary | ICD-10-CM | POA: Diagnosis not present

## 2020-12-12 DIAGNOSIS — I5020 Unspecified systolic (congestive) heart failure: Secondary | ICD-10-CM | POA: Diagnosis not present

## 2020-12-12 DIAGNOSIS — Z0000 Encounter for general adult medical examination without abnormal findings: Secondary | ICD-10-CM | POA: Diagnosis not present

## 2020-12-12 DIAGNOSIS — Z794 Long term (current) use of insulin: Secondary | ICD-10-CM | POA: Diagnosis not present

## 2020-12-12 DIAGNOSIS — E1122 Type 2 diabetes mellitus with diabetic chronic kidney disease: Secondary | ICD-10-CM | POA: Diagnosis not present

## 2020-12-26 DIAGNOSIS — E1122 Type 2 diabetes mellitus with diabetic chronic kidney disease: Secondary | ICD-10-CM | POA: Diagnosis not present

## 2020-12-26 DIAGNOSIS — Z794 Long term (current) use of insulin: Secondary | ICD-10-CM | POA: Diagnosis not present

## 2020-12-26 DIAGNOSIS — N1832 Chronic kidney disease, stage 3b: Secondary | ICD-10-CM | POA: Diagnosis not present

## 2021-01-01 DIAGNOSIS — I340 Nonrheumatic mitral (valve) insufficiency: Secondary | ICD-10-CM | POA: Diagnosis not present

## 2021-01-01 DIAGNOSIS — I429 Cardiomyopathy, unspecified: Secondary | ICD-10-CM | POA: Diagnosis not present

## 2021-01-01 DIAGNOSIS — I255 Ischemic cardiomyopathy: Secondary | ICD-10-CM | POA: Diagnosis not present

## 2021-01-01 DIAGNOSIS — I213 ST elevation (STEMI) myocardial infarction of unspecified site: Secondary | ICD-10-CM | POA: Diagnosis not present

## 2021-01-01 DIAGNOSIS — I5042 Chronic combined systolic (congestive) and diastolic (congestive) heart failure: Secondary | ICD-10-CM | POA: Diagnosis not present

## 2021-01-01 DIAGNOSIS — Z95810 Presence of automatic (implantable) cardiac defibrillator: Secondary | ICD-10-CM | POA: Diagnosis not present

## 2021-01-01 DIAGNOSIS — I5020 Unspecified systolic (congestive) heart failure: Secondary | ICD-10-CM | POA: Diagnosis not present

## 2021-01-06 DIAGNOSIS — I442 Atrioventricular block, complete: Secondary | ICD-10-CM | POA: Diagnosis not present

## 2021-01-11 ENCOUNTER — Other Ambulatory Visit: Payer: Self-pay | Admitting: Family Medicine

## 2021-02-17 DIAGNOSIS — R0989 Other specified symptoms and signs involving the circulatory and respiratory systems: Secondary | ICD-10-CM | POA: Diagnosis not present

## 2021-02-17 DIAGNOSIS — I5042 Chronic combined systolic (congestive) and diastolic (congestive) heart failure: Secondary | ICD-10-CM | POA: Diagnosis not present

## 2021-02-17 DIAGNOSIS — R059 Cough, unspecified: Secondary | ICD-10-CM | POA: Diagnosis not present

## 2021-02-17 DIAGNOSIS — R0601 Orthopnea: Secondary | ICD-10-CM | POA: Diagnosis not present

## 2021-02-18 DIAGNOSIS — R059 Cough, unspecified: Secondary | ICD-10-CM | POA: Diagnosis not present

## 2021-02-18 DIAGNOSIS — I5042 Chronic combined systolic (congestive) and diastolic (congestive) heart failure: Secondary | ICD-10-CM | POA: Diagnosis not present

## 2021-02-18 DIAGNOSIS — R0989 Other specified symptoms and signs involving the circulatory and respiratory systems: Secondary | ICD-10-CM | POA: Diagnosis not present

## 2021-02-21 DIAGNOSIS — I5042 Chronic combined systolic (congestive) and diastolic (congestive) heart failure: Secondary | ICD-10-CM | POA: Diagnosis not present

## 2021-02-25 DIAGNOSIS — I5020 Unspecified systolic (congestive) heart failure: Secondary | ICD-10-CM | POA: Diagnosis not present

## 2021-02-25 DIAGNOSIS — I429 Cardiomyopathy, unspecified: Secondary | ICD-10-CM | POA: Diagnosis not present

## 2021-02-26 DIAGNOSIS — E1122 Type 2 diabetes mellitus with diabetic chronic kidney disease: Secondary | ICD-10-CM | POA: Diagnosis not present

## 2021-02-26 DIAGNOSIS — I213 ST elevation (STEMI) myocardial infarction of unspecified site: Secondary | ICD-10-CM | POA: Diagnosis not present

## 2021-02-26 DIAGNOSIS — I5042 Chronic combined systolic (congestive) and diastolic (congestive) heart failure: Secondary | ICD-10-CM | POA: Diagnosis not present

## 2021-02-26 DIAGNOSIS — R0989 Other specified symptoms and signs involving the circulatory and respiratory systems: Secondary | ICD-10-CM | POA: Diagnosis not present

## 2021-02-26 DIAGNOSIS — I952 Hypotension due to drugs: Secondary | ICD-10-CM | POA: Diagnosis not present

## 2021-02-26 DIAGNOSIS — N1832 Chronic kidney disease, stage 3b: Secondary | ICD-10-CM | POA: Diagnosis not present

## 2021-02-26 DIAGNOSIS — R3912 Poor urinary stream: Secondary | ICD-10-CM | POA: Diagnosis not present

## 2021-02-26 DIAGNOSIS — Z794 Long term (current) use of insulin: Secondary | ICD-10-CM | POA: Diagnosis not present

## 2021-03-03 DIAGNOSIS — H2513 Age-related nuclear cataract, bilateral: Secondary | ICD-10-CM | POA: Diagnosis not present

## 2021-03-03 DIAGNOSIS — E119 Type 2 diabetes mellitus without complications: Secondary | ICD-10-CM | POA: Diagnosis not present

## 2021-03-03 DIAGNOSIS — Z794 Long term (current) use of insulin: Secondary | ICD-10-CM | POA: Diagnosis not present

## 2021-03-12 DIAGNOSIS — E1122 Type 2 diabetes mellitus with diabetic chronic kidney disease: Secondary | ICD-10-CM | POA: Diagnosis not present

## 2021-03-12 DIAGNOSIS — Z794 Long term (current) use of insulin: Secondary | ICD-10-CM | POA: Diagnosis not present

## 2021-03-12 DIAGNOSIS — N1832 Chronic kidney disease, stage 3b: Secondary | ICD-10-CM | POA: Diagnosis not present

## 2021-03-14 DIAGNOSIS — I509 Heart failure, unspecified: Secondary | ICD-10-CM | POA: Diagnosis not present

## 2021-03-14 DIAGNOSIS — Z950 Presence of cardiac pacemaker: Secondary | ICD-10-CM | POA: Diagnosis not present

## 2021-03-14 DIAGNOSIS — R9439 Abnormal result of other cardiovascular function study: Secondary | ICD-10-CM | POA: Diagnosis not present

## 2021-03-14 DIAGNOSIS — I5043 Acute on chronic combined systolic (congestive) and diastolic (congestive) heart failure: Secondary | ICD-10-CM | POA: Diagnosis not present

## 2021-03-14 DIAGNOSIS — I2510 Atherosclerotic heart disease of native coronary artery without angina pectoris: Secondary | ICD-10-CM | POA: Diagnosis not present

## 2021-03-14 DIAGNOSIS — I255 Ischemic cardiomyopathy: Secondary | ICD-10-CM | POA: Diagnosis not present

## 2021-03-14 DIAGNOSIS — Z95810 Presence of automatic (implantable) cardiac defibrillator: Secondary | ICD-10-CM | POA: Diagnosis not present

## 2021-04-29 DIAGNOSIS — Z872 Personal history of diseases of the skin and subcutaneous tissue: Secondary | ICD-10-CM | POA: Diagnosis not present

## 2021-04-29 DIAGNOSIS — Z85828 Personal history of other malignant neoplasm of skin: Secondary | ICD-10-CM | POA: Diagnosis not present

## 2021-04-29 DIAGNOSIS — L570 Actinic keratosis: Secondary | ICD-10-CM | POA: Diagnosis not present

## 2021-04-29 DIAGNOSIS — L578 Other skin changes due to chronic exposure to nonionizing radiation: Secondary | ICD-10-CM | POA: Diagnosis not present

## 2021-04-29 DIAGNOSIS — L821 Other seborrheic keratosis: Secondary | ICD-10-CM | POA: Diagnosis not present

## 2021-05-07 DIAGNOSIS — N1831 Chronic kidney disease, stage 3a: Secondary | ICD-10-CM | POA: Diagnosis not present

## 2021-06-11 DIAGNOSIS — E1122 Type 2 diabetes mellitus with diabetic chronic kidney disease: Secondary | ICD-10-CM | POA: Diagnosis not present

## 2021-06-11 DIAGNOSIS — Z794 Long term (current) use of insulin: Secondary | ICD-10-CM | POA: Diagnosis not present

## 2021-06-11 DIAGNOSIS — N1832 Chronic kidney disease, stage 3b: Secondary | ICD-10-CM | POA: Diagnosis not present

## 2021-06-16 DIAGNOSIS — I429 Cardiomyopathy, unspecified: Secondary | ICD-10-CM | POA: Diagnosis not present

## 2021-06-30 DIAGNOSIS — E1122 Type 2 diabetes mellitus with diabetic chronic kidney disease: Secondary | ICD-10-CM | POA: Diagnosis not present

## 2021-06-30 DIAGNOSIS — K219 Gastro-esophageal reflux disease without esophagitis: Secondary | ICD-10-CM | POA: Diagnosis not present

## 2021-06-30 DIAGNOSIS — N1832 Chronic kidney disease, stage 3b: Secondary | ICD-10-CM | POA: Diagnosis not present

## 2021-06-30 DIAGNOSIS — Z794 Long term (current) use of insulin: Secondary | ICD-10-CM | POA: Diagnosis not present

## 2021-06-30 DIAGNOSIS — I5042 Chronic combined systolic (congestive) and diastolic (congestive) heart failure: Secondary | ICD-10-CM | POA: Diagnosis not present

## 2021-08-26 DIAGNOSIS — I429 Cardiomyopathy, unspecified: Secondary | ICD-10-CM | POA: Diagnosis not present

## 2021-08-26 DIAGNOSIS — I5020 Unspecified systolic (congestive) heart failure: Secondary | ICD-10-CM | POA: Diagnosis not present

## 2021-09-16 DIAGNOSIS — E1122 Type 2 diabetes mellitus with diabetic chronic kidney disease: Secondary | ICD-10-CM | POA: Diagnosis not present

## 2021-09-16 DIAGNOSIS — N1832 Chronic kidney disease, stage 3b: Secondary | ICD-10-CM | POA: Diagnosis not present

## 2021-09-16 DIAGNOSIS — Z794 Long term (current) use of insulin: Secondary | ICD-10-CM | POA: Diagnosis not present

## 2021-09-29 DIAGNOSIS — I429 Cardiomyopathy, unspecified: Secondary | ICD-10-CM | POA: Diagnosis not present

## 2021-10-08 DIAGNOSIS — Z794 Long term (current) use of insulin: Secondary | ICD-10-CM | POA: Diagnosis not present

## 2021-10-08 DIAGNOSIS — I5042 Chronic combined systolic (congestive) and diastolic (congestive) heart failure: Secondary | ICD-10-CM | POA: Diagnosis not present

## 2021-10-08 DIAGNOSIS — E1122 Type 2 diabetes mellitus with diabetic chronic kidney disease: Secondary | ICD-10-CM | POA: Diagnosis not present

## 2021-10-08 DIAGNOSIS — M546 Pain in thoracic spine: Secondary | ICD-10-CM | POA: Diagnosis not present

## 2021-10-08 DIAGNOSIS — K219 Gastro-esophageal reflux disease without esophagitis: Secondary | ICD-10-CM | POA: Diagnosis not present

## 2021-10-08 DIAGNOSIS — N1832 Chronic kidney disease, stage 3b: Secondary | ICD-10-CM | POA: Diagnosis not present

## 2022-08-02 ENCOUNTER — Emergency Department (HOSPITAL_COMMUNITY): Payer: Medicare Other

## 2022-08-02 ENCOUNTER — Inpatient Hospital Stay (HOSPITAL_COMMUNITY)
Admission: EM | Admit: 2022-08-02 | Discharge: 2022-08-05 | DRG: 178 | Disposition: A | Discharge status: Home Health | Payer: Medicare Other | Attending: Family Medicine | Admitting: Family Medicine

## 2022-08-02 ENCOUNTER — Other Ambulatory Visit: Payer: Self-pay

## 2022-08-02 ENCOUNTER — Encounter (HOSPITAL_COMMUNITY): Payer: Self-pay

## 2022-08-02 DIAGNOSIS — K7460 Unspecified cirrhosis of liver: Secondary | ICD-10-CM | POA: Diagnosis present

## 2022-08-02 DIAGNOSIS — E785 Hyperlipidemia, unspecified: Secondary | ICD-10-CM | POA: Diagnosis present

## 2022-08-02 DIAGNOSIS — T462X5A Adverse effect of other antidysrhythmic drugs, initial encounter: Secondary | ICD-10-CM | POA: Diagnosis present

## 2022-08-02 DIAGNOSIS — K716 Toxic liver disease with hepatitis, not elsewhere classified: Secondary | ICD-10-CM | POA: Diagnosis present

## 2022-08-02 DIAGNOSIS — N179 Acute kidney failure, unspecified: Secondary | ICD-10-CM | POA: Diagnosis present

## 2022-08-02 DIAGNOSIS — U071 COVID-19: Secondary | ICD-10-CM | POA: Diagnosis not present

## 2022-08-02 DIAGNOSIS — R296 Repeated falls: Secondary | ICD-10-CM | POA: Diagnosis present

## 2022-08-02 DIAGNOSIS — I959 Hypotension, unspecified: Secondary | ICD-10-CM | POA: Diagnosis present

## 2022-08-02 DIAGNOSIS — Z950 Presence of cardiac pacemaker: Secondary | ICD-10-CM | POA: Diagnosis present

## 2022-08-02 DIAGNOSIS — R7401 Elevation of levels of liver transaminase levels: Secondary | ICD-10-CM

## 2022-08-02 DIAGNOSIS — I2510 Atherosclerotic heart disease of native coronary artery without angina pectoris: Secondary | ICD-10-CM | POA: Diagnosis present

## 2022-08-02 DIAGNOSIS — N1832 Chronic kidney disease, stage 3b: Secondary | ICD-10-CM | POA: Diagnosis present

## 2022-08-02 DIAGNOSIS — I152 Hypertension secondary to endocrine disorders: Secondary | ICD-10-CM | POA: Diagnosis present

## 2022-08-02 DIAGNOSIS — D696 Thrombocytopenia, unspecified: Secondary | ICD-10-CM | POA: Diagnosis present

## 2022-08-02 DIAGNOSIS — Z95810 Presence of automatic (implantable) cardiac defibrillator: Secondary | ICD-10-CM

## 2022-08-02 DIAGNOSIS — Z7982 Long term (current) use of aspirin: Secondary | ICD-10-CM

## 2022-08-02 DIAGNOSIS — E860 Dehydration: Secondary | ICD-10-CM | POA: Diagnosis present

## 2022-08-02 DIAGNOSIS — I442 Atrioventricular block, complete: Secondary | ICD-10-CM | POA: Diagnosis present

## 2022-08-02 DIAGNOSIS — I5042 Chronic combined systolic (congestive) and diastolic (congestive) heart failure: Secondary | ICD-10-CM | POA: Diagnosis present

## 2022-08-02 DIAGNOSIS — Z794 Long term (current) use of insulin: Secondary | ICD-10-CM

## 2022-08-02 DIAGNOSIS — D689 Coagulation defect, unspecified: Secondary | ICD-10-CM | POA: Diagnosis present

## 2022-08-02 DIAGNOSIS — E1169 Type 2 diabetes mellitus with other specified complication: Secondary | ICD-10-CM | POA: Diagnosis present

## 2022-08-02 DIAGNOSIS — E1122 Type 2 diabetes mellitus with diabetic chronic kidney disease: Secondary | ICD-10-CM | POA: Diagnosis present

## 2022-08-02 DIAGNOSIS — E669 Obesity, unspecified: Secondary | ICD-10-CM | POA: Diagnosis present

## 2022-08-02 DIAGNOSIS — I255 Ischemic cardiomyopathy: Secondary | ICD-10-CM | POA: Diagnosis present

## 2022-08-02 DIAGNOSIS — Z87891 Personal history of nicotine dependence: Secondary | ICD-10-CM

## 2022-08-02 DIAGNOSIS — I472 Ventricular tachycardia, unspecified: Secondary | ICD-10-CM | POA: Diagnosis present

## 2022-08-02 DIAGNOSIS — I5020 Unspecified systolic (congestive) heart failure: Secondary | ICD-10-CM | POA: Diagnosis present

## 2022-08-02 DIAGNOSIS — Z79899 Other long term (current) drug therapy: Secondary | ICD-10-CM

## 2022-08-02 DIAGNOSIS — Z833 Family history of diabetes mellitus: Secondary | ICD-10-CM

## 2022-08-02 DIAGNOSIS — Z6832 Body mass index (BMI) 32.0-32.9, adult: Secondary | ICD-10-CM

## 2022-08-02 DIAGNOSIS — Z8249 Family history of ischemic heart disease and other diseases of the circulatory system: Secondary | ICD-10-CM

## 2022-08-02 DIAGNOSIS — E1165 Type 2 diabetes mellitus with hyperglycemia: Secondary | ICD-10-CM | POA: Diagnosis present

## 2022-08-02 DIAGNOSIS — K219 Gastro-esophageal reflux disease without esophagitis: Secondary | ICD-10-CM | POA: Diagnosis present

## 2022-08-02 LAB — COMPREHENSIVE METABOLIC PANEL
ALT: 875 U/L — ABNORMAL HIGH (ref 0–44)
AST: 958 U/L — ABNORMAL HIGH (ref 15–41)
Albumin: 3.3 g/dL — ABNORMAL LOW (ref 3.5–5.0)
Alkaline Phosphatase: 59 U/L (ref 38–126)
Anion gap: 7 (ref 5–15)
BUN: 36 mg/dL — ABNORMAL HIGH (ref 8–23)
CO2: 22 mmol/L (ref 22–32)
Calcium: 8.8 mg/dL — ABNORMAL LOW (ref 8.9–10.3)
Chloride: 106 mmol/L (ref 98–111)
Creatinine, Ser: 2.6 mg/dL — ABNORMAL HIGH (ref 0.61–1.24)
GFR, Estimated: 24 mL/min — ABNORMAL LOW (ref >60)
Glucose, Bld: 189 mg/dL — ABNORMAL HIGH (ref 70–99)
Potassium: 5 mmol/L (ref 3.5–5.1)
Sodium: 135 mmol/L (ref 135–145)
Total Bilirubin: 0.9 mg/dL (ref 0.3–1.2)
Total Protein: 6 g/dL — ABNORMAL LOW (ref 6.5–8.1)

## 2022-08-02 LAB — RESP PANEL BY RT-PCR (FLU A&B, COVID) ARPGX2
Influenza A by PCR: NEGATIVE
Influenza B by PCR: NEGATIVE
SARS Coronavirus 2 by RT PCR: POSITIVE — AB

## 2022-08-02 LAB — CBC WITH DIFFERENTIAL/PLATELET
Abs Immature Granulocytes: 0.02 10*3/uL (ref 0.00–0.07)
Basophils Absolute: 0 10*3/uL (ref 0.0–0.1)
Basophils Relative: 0 %
Eosinophils Absolute: 0 10*3/uL (ref 0.0–0.5)
Eosinophils Relative: 0 %
HCT: 40.4 % (ref 39.0–52.0)
Hemoglobin: 13.1 g/dL (ref 13.0–17.0)
Immature Granulocytes: 0 %
Lymphocytes Relative: 8 %
Lymphs Abs: 0.6 10*3/uL — ABNORMAL LOW (ref 0.7–4.0)
MCH: 30.5 pg (ref 26.0–34.0)
MCHC: 32.4 g/dL (ref 30.0–36.0)
MCV: 94.2 fL (ref 80.0–100.0)
Monocytes Absolute: 0.4 10*3/uL (ref 0.1–1.0)
Monocytes Relative: 5 %
Neutro Abs: 6.1 10*3/uL (ref 1.7–7.7)
Neutrophils Relative %: 87 %
Platelets: 103 10*3/uL — ABNORMAL LOW (ref 150–400)
RBC: 4.29 MIL/uL (ref 4.22–5.81)
RDW: 14.9 % (ref 11.5–15.5)
WBC: 7.1 10*3/uL (ref 4.0–10.5)
nRBC: 0 % (ref 0.0–0.2)

## 2022-08-02 LAB — TROPONIN I (HIGH SENSITIVITY): Troponin I (High Sensitivity): 43 ng/L — ABNORMAL HIGH (ref <18)

## 2022-08-02 LAB — CBG MONITORING, ED: Glucose-Capillary: 188 mg/dL — ABNORMAL HIGH (ref 70–99)

## 2022-08-02 MED ORDER — ACETAMINOPHEN 325 MG PO TABS
650.0000 mg | ORAL_TABLET | Freq: Once | ORAL | Status: AC
Start: 2022-08-03 — End: 2022-08-03
  Administered 2022-08-03: 650 mg via ORAL
  Filled 2022-08-02: qty 2

## 2022-08-02 MED ORDER — SODIUM CHLORIDE 0.9 % IV BOLUS (SEPSIS)
250.0000 mL | Freq: Once | INTRAVENOUS | Status: AC
  Administered 2022-08-03: 250 mL via INTRAVENOUS

## 2022-08-02 NOTE — ED Triage Notes (Signed)
pT bib ems for multiple falls and near syncopal episodes. Pt fell and hit head yesterday. No LOC no thinners. Pt does believe he has covid and has been feeling fatigueg and weak. Denies SOB and CP

## 2022-08-02 NOTE — ED Provider Triage Note (Signed)
Emergency Medicine Provider Triage Evaluation Note  [name] , a 81 y.o. male  was evaluated in triage.  Pt complains of multiple falls.  States that he fell a few days ago out of bed striking his head on the bedside table.  He states that since then he has been somewhat off balance and dizzy and figured he had a concussion.  States that he has fallen probably 4 more times since then.  He blames this on tripping on stuff in the house.  He also thinks he has COVID as he has had 4 exposures.  He has had some congestion but no cough or fever.  Does not feel short of breath.  Denies chest pain, palpitations, abdominal pain, diarrhea.  He has had some mild nausea without vomiting..  Review of Systems  Positive: See above Negative:   Physical Exam  BP (!) 107/47   Pulse 64   Temp (!) 100.6 F (38.1 C) (Oral)   Resp 18   Ht 6' (1.829 m)   Wt 108.8 kg   SpO2 94%   BMI 32.53 kg/m  Gen:   Awake, alert and oriented Resp:  Normal effort  MSK:   Moves extremities without difficulty  Other:    Medical Decision Making  Medically screening exam initiated at 8:05 PM.  Appropriate orders placed.  [name] was informed that the remainder of the evaluation will be completed by another provider, this initial triage assessment does not replace that evaluation, and the importance of remaining in the ED until their evaluation is complete.     Richard Hillier, PA-C 08/02/22 2006

## 2022-08-02 NOTE — ED Notes (Signed)
Patient moved to appropriate seating area and triage rn notified of patient temp

## 2022-08-03 ENCOUNTER — Inpatient Hospital Stay (HOSPITAL_COMMUNITY): Payer: Medicare Other

## 2022-08-03 DIAGNOSIS — R296 Repeated falls: Secondary | ICD-10-CM | POA: Diagnosis present

## 2022-08-03 DIAGNOSIS — I5020 Unspecified systolic (congestive) heart failure: Secondary | ICD-10-CM | POA: Diagnosis not present

## 2022-08-03 DIAGNOSIS — E1122 Type 2 diabetes mellitus with diabetic chronic kidney disease: Secondary | ICD-10-CM | POA: Diagnosis present

## 2022-08-03 DIAGNOSIS — N179 Acute kidney failure, unspecified: Secondary | ICD-10-CM | POA: Diagnosis present

## 2022-08-03 DIAGNOSIS — Z95810 Presence of automatic (implantable) cardiac defibrillator: Secondary | ICD-10-CM | POA: Diagnosis not present

## 2022-08-03 DIAGNOSIS — E1159 Type 2 diabetes mellitus with other circulatory complications: Secondary | ICD-10-CM

## 2022-08-03 DIAGNOSIS — R7401 Elevation of levels of liver transaminase levels: Secondary | ICD-10-CM | POA: Diagnosis present

## 2022-08-03 DIAGNOSIS — D696 Thrombocytopenia, unspecified: Secondary | ICD-10-CM

## 2022-08-03 DIAGNOSIS — E785 Hyperlipidemia, unspecified: Secondary | ICD-10-CM | POA: Diagnosis present

## 2022-08-03 DIAGNOSIS — I5042 Chronic combined systolic (congestive) and diastolic (congestive) heart failure: Secondary | ICD-10-CM | POA: Diagnosis present

## 2022-08-03 DIAGNOSIS — K716 Toxic liver disease with hepatitis, not elsewhere classified: Secondary | ICD-10-CM | POA: Diagnosis present

## 2022-08-03 DIAGNOSIS — I255 Ischemic cardiomyopathy: Secondary | ICD-10-CM | POA: Diagnosis present

## 2022-08-03 DIAGNOSIS — K219 Gastro-esophageal reflux disease without esophagitis: Secondary | ICD-10-CM

## 2022-08-03 DIAGNOSIS — Z950 Presence of cardiac pacemaker: Secondary | ICD-10-CM | POA: Diagnosis not present

## 2022-08-03 DIAGNOSIS — E1169 Type 2 diabetes mellitus with other specified complication: Secondary | ICD-10-CM

## 2022-08-03 DIAGNOSIS — U071 COVID-19: Secondary | ICD-10-CM | POA: Diagnosis present

## 2022-08-03 DIAGNOSIS — E1165 Type 2 diabetes mellitus with hyperglycemia: Secondary | ICD-10-CM | POA: Diagnosis present

## 2022-08-03 DIAGNOSIS — N189 Chronic kidney disease, unspecified: Secondary | ICD-10-CM

## 2022-08-03 DIAGNOSIS — D689 Coagulation defect, unspecified: Secondary | ICD-10-CM | POA: Diagnosis present

## 2022-08-03 DIAGNOSIS — I5022 Chronic systolic (congestive) heart failure: Secondary | ICD-10-CM | POA: Diagnosis not present

## 2022-08-03 DIAGNOSIS — E860 Dehydration: Secondary | ICD-10-CM | POA: Diagnosis present

## 2022-08-03 DIAGNOSIS — I152 Hypertension secondary to endocrine disorders: Secondary | ICD-10-CM

## 2022-08-03 DIAGNOSIS — K7460 Unspecified cirrhosis of liver: Secondary | ICD-10-CM | POA: Diagnosis present

## 2022-08-03 DIAGNOSIS — Z6832 Body mass index (BMI) 32.0-32.9, adult: Secondary | ICD-10-CM | POA: Diagnosis not present

## 2022-08-03 DIAGNOSIS — E669 Obesity, unspecified: Secondary | ICD-10-CM | POA: Diagnosis present

## 2022-08-03 DIAGNOSIS — I442 Atrioventricular block, complete: Secondary | ICD-10-CM | POA: Diagnosis present

## 2022-08-03 DIAGNOSIS — I2510 Atherosclerotic heart disease of native coronary artery without angina pectoris: Secondary | ICD-10-CM | POA: Diagnosis present

## 2022-08-03 DIAGNOSIS — Z794 Long term (current) use of insulin: Secondary | ICD-10-CM | POA: Diagnosis not present

## 2022-08-03 DIAGNOSIS — I959 Hypotension, unspecified: Secondary | ICD-10-CM | POA: Diagnosis present

## 2022-08-03 DIAGNOSIS — I472 Ventricular tachycardia, unspecified: Secondary | ICD-10-CM | POA: Diagnosis present

## 2022-08-03 DIAGNOSIS — N1832 Chronic kidney disease, stage 3b: Secondary | ICD-10-CM | POA: Diagnosis present

## 2022-08-03 LAB — URINALYSIS, ROUTINE W REFLEX MICROSCOPIC
Bilirubin Urine: NEGATIVE
Glucose, UA: >=500 mg/dL — AB
Ketones, ur: NEGATIVE mg/dL
Leukocytes,Ua: NEGATIVE
Nitrite: NEGATIVE
Protein, ur: 30 mg/dL — AB
Specific Gravity, Urine: 1.022 (ref 1.005–1.030)
pH: 5 (ref 5.0–8.0)

## 2022-08-03 LAB — BRAIN NATRIURETIC PEPTIDE: B Natriuretic Peptide: 288.6 pg/mL — ABNORMAL HIGH (ref 0.0–100.0)

## 2022-08-03 LAB — COMPREHENSIVE METABOLIC PANEL
ALT: 1348 U/L — ABNORMAL HIGH (ref 0–44)
AST: 1568 U/L — ABNORMAL HIGH (ref 15–41)
Albumin: 3.1 g/dL — ABNORMAL LOW (ref 3.5–5.0)
Alkaline Phosphatase: 51 U/L (ref 38–126)
Anion gap: 8 (ref 5–15)
BUN: 39 mg/dL — ABNORMAL HIGH (ref 8–23)
CO2: 20 mmol/L — ABNORMAL LOW (ref 22–32)
Calcium: 8.3 mg/dL — ABNORMAL LOW (ref 8.9–10.3)
Chloride: 108 mmol/L (ref 98–111)
Creatinine, Ser: 2.63 mg/dL — ABNORMAL HIGH (ref 0.61–1.24)
GFR, Estimated: 24 mL/min — ABNORMAL LOW (ref >60)
Glucose, Bld: 164 mg/dL — ABNORMAL HIGH (ref 70–99)
Potassium: 4.9 mmol/L (ref 3.5–5.1)
Sodium: 136 mmol/L (ref 135–145)
Total Bilirubin: 1.1 mg/dL (ref 0.3–1.2)
Total Protein: 5.5 g/dL — ABNORMAL LOW (ref 6.5–8.1)

## 2022-08-03 LAB — HEPATITIS B SURFACE ANTIGEN: Hepatitis B Surface Ag: NONREACTIVE

## 2022-08-03 LAB — HEPATITIS PANEL, ACUTE
HCV Ab: NONREACTIVE
Hep A IgM: NONREACTIVE
Hep B C IgM: NONREACTIVE
Hepatitis B Surface Ag: NONREACTIVE

## 2022-08-03 LAB — GLUCOSE, CAPILLARY: Glucose-Capillary: 166 mg/dL — ABNORMAL HIGH (ref 70–99)

## 2022-08-03 LAB — CBG MONITORING, ED
Glucose-Capillary: 127 mg/dL — ABNORMAL HIGH (ref 70–99)
Glucose-Capillary: 120 mg/dL — ABNORMAL HIGH (ref 70–99)

## 2022-08-03 LAB — PROCALCITONIN: Procalcitonin: 0.2 ng/mL

## 2022-08-03 LAB — HEMOGLOBIN A1C
Hgb A1c MFr Bld: 7.2 % — ABNORMAL HIGH (ref 4.8–5.6)
Mean Plasma Glucose: 159.94 mg/dL

## 2022-08-03 LAB — PHOSPHORUS: Phosphorus: 4.8 mg/dL — ABNORMAL HIGH (ref 2.5–4.6)

## 2022-08-03 LAB — FERRITIN: Ferritin: 2485 ng/mL — ABNORMAL HIGH (ref 24–336)

## 2022-08-03 LAB — SODIUM, URINE, RANDOM: Sodium, Ur: 45 mmol/L

## 2022-08-03 LAB — FIBRINOGEN: Fibrinogen: 343 mg/dL (ref 210–475)

## 2022-08-03 LAB — PROTIME-INR
INR: 1.3 — ABNORMAL HIGH (ref 0.8–1.2)
Prothrombin Time: 16 seconds — ABNORMAL HIGH (ref 11.4–15.2)

## 2022-08-03 LAB — C-REACTIVE PROTEIN: CRP: 4.1 mg/dL — ABNORMAL HIGH (ref <1.0)

## 2022-08-03 LAB — TROPONIN I (HIGH SENSITIVITY): Troponin I (High Sensitivity): 49 ng/L — ABNORMAL HIGH (ref <18)

## 2022-08-03 LAB — DIGOXIN LEVEL: Digoxin Level: <0.2 ng/mL — ABNORMAL LOW (ref 0.8–2.0)

## 2022-08-03 LAB — TSH: TSH: 0.812 u[IU]/mL (ref 0.350–4.500)

## 2022-08-03 LAB — MAGNESIUM: Magnesium: 2.1 mg/dL (ref 1.7–2.4)

## 2022-08-03 LAB — APTT: aPTT: 33 seconds (ref 24–36)

## 2022-08-03 LAB — AMMONIA: Ammonia: 43 umol/L — ABNORMAL HIGH (ref 9–35)

## 2022-08-03 LAB — D-DIMER, QUANTITATIVE: D-Dimer, Quant: 3.44 ug/mL-FEU — ABNORMAL HIGH (ref 0.00–0.50)

## 2022-08-03 LAB — CREATININE, URINE, RANDOM: Creatinine, Urine: 114 mg/dL

## 2022-08-03 LAB — LACTIC ACID, PLASMA: Lactic Acid, Venous: 1.8 mmol/L (ref 0.5–1.9)

## 2022-08-03 LAB — LACTATE DEHYDROGENASE: LDH: 1483 U/L — ABNORMAL HIGH (ref 98–192)

## 2022-08-03 LAB — CK: Total CK: 167 U/L (ref 49–397)

## 2022-08-03 MED ORDER — PANTOPRAZOLE SODIUM 40 MG PO TBEC
40.0000 mg | DELAYED_RELEASE_TABLET | Freq: Every day | ORAL | Status: DC
  Administered 2022-08-03 – 2022-08-05 (×3): 40 mg via ORAL
  Filled 2022-08-03 (×3): qty 1

## 2022-08-03 MED ORDER — GUAIFENESIN-DM 100-10 MG/5ML PO SYRP
10.0000 mL | ORAL_SOLUTION | ORAL | Status: DC | PRN
  Administered 2022-08-03: 10 mL via ORAL
  Filled 2022-08-03 (×2): qty 10

## 2022-08-03 MED ORDER — HEPARIN SODIUM (PORCINE) 5000 UNIT/ML IJ SOLN
5000.0000 [IU] | Freq: Three times a day (TID) | INTRAMUSCULAR | Status: DC
Start: 2022-08-03 — End: 2022-08-04
  Administered 2022-08-03 – 2022-08-04 (×3): 5000 [IU] via SUBCUTANEOUS
  Filled 2022-08-03 (×3): qty 1

## 2022-08-03 MED ORDER — INSULIN GLARGINE-YFGN 100 UNIT/ML ~~LOC~~ SOLN
30.0000 [IU] | Freq: Every day | SUBCUTANEOUS | Status: DC
  Administered 2022-08-03: 30 [IU] via SUBCUTANEOUS
  Filled 2022-08-03 (×3): qty 0.3

## 2022-08-03 MED ORDER — SODIUM CHLORIDE 0.9 % IV BOLUS (SEPSIS)
250.0000 mL | Freq: Once | INTRAVENOUS | Status: AC
  Administered 2022-08-03: 250 mL via INTRAVENOUS

## 2022-08-03 MED ORDER — ASPIRIN 325 MG PO TABS
325.0000 mg | ORAL_TABLET | Freq: Every day | ORAL | Status: DC
  Administered 2022-08-03: 325 mg via ORAL
  Filled 2022-08-03: qty 1

## 2022-08-03 MED ORDER — INSULIN ASPART 100 UNIT/ML IJ SOLN: 0.0000 [IU] | Freq: Every day | INTRAMUSCULAR | Status: DC

## 2022-08-03 MED ORDER — SODIUM CHLORIDE 0.9 % IV SOLN
INTRAVENOUS | Status: DC
Start: 2022-08-03 — End: 2022-08-04

## 2022-08-03 MED ORDER — MOLNUPIRAVIR EUA 200MG CAPSULE
4.0000 | ORAL_CAPSULE | Freq: Two times a day (BID) | ORAL | Status: DC
  Administered 2022-08-03 – 2022-08-05 (×5): 800 mg via ORAL
  Filled 2022-08-03 (×2): qty 4

## 2022-08-03 MED ORDER — ALBUTEROL SULFATE (2.5 MG/3ML) 0.083% IN NEBU
2.5000 mg | INHALATION_SOLUTION | Freq: Four times a day (QID) | RESPIRATORY_TRACT | Status: DC | PRN
Start: 2022-08-03 — End: 2022-08-03

## 2022-08-03 MED ORDER — INSULIN ASPART 100 UNIT/ML IJ SOLN
0.0000 [IU] | Freq: Three times a day (TID) | INTRAMUSCULAR | Status: DC
  Administered 2022-08-03: 3 [IU] via SUBCUTANEOUS

## 2022-08-03 MED ORDER — FLUTICASONE PROPIONATE 50 MCG/ACT NA SUSP: 1.0000 | Freq: Two times a day (BID) | NASAL | Status: DC | PRN

## 2022-08-03 MED ORDER — ALBUTEROL SULFATE HFA 108 (90 BASE) MCG/ACT IN AERS
2.0000 | INHALATION_SPRAY | Freq: Four times a day (QID) | RESPIRATORY_TRACT | Status: DC
  Administered 2022-08-03: 2 via RESPIRATORY_TRACT
  Filled 2022-08-03: qty 6.7

## 2022-08-03 MED ORDER — ZINC SULFATE 220 (50 ZN) MG PO CAPS
220.0000 mg | ORAL_CAPSULE | Freq: Every day | ORAL | Status: DC
  Administered 2022-08-03 – 2022-08-05 (×3): 220 mg via ORAL
  Filled 2022-08-03 (×3): qty 1

## 2022-08-03 MED ORDER — AEROCHAMBER PLUS FLO-VU LARGE MISC
Status: AC
  Filled 2022-08-03: qty 1

## 2022-08-03 MED ORDER — AZELASTINE HCL 0.1 % NA SOLN
1.0000 | Freq: Two times a day (BID) | NASAL | Status: DC | PRN
Start: 2022-08-03 — End: 2022-08-05

## 2022-08-03 MED ORDER — ASCORBIC ACID 500 MG PO TABS
500.0000 mg | ORAL_TABLET | Freq: Every day | ORAL | Status: DC
  Administered 2022-08-03 – 2022-08-05 (×3): 500 mg via ORAL
  Filled 2022-08-03 (×3): qty 1

## 2022-08-03 MED ORDER — HYDROCOD POLI-CHLORPHE POLI ER 10-8 MG/5ML PO SUER
5.0000 mL | Freq: Two times a day (BID) | ORAL | Status: DC | PRN
  Administered 2022-08-03: 5 mL via ORAL
  Filled 2022-08-03: qty 5

## 2022-08-03 MED ORDER — SODIUM CHLORIDE 0.9% FLUSH
3.0000 mL | Freq: Two times a day (BID) | INTRAVENOUS | Status: DC
  Administered 2022-08-04 – 2022-08-05 (×2): 3 mL via INTRAVENOUS

## 2022-08-03 NOTE — H&P (Addendum)
History and Physical    Patient: [name] MHW:[id number] DOB: 04/09/1941 DOA: 08/02/2022 DOS: the patient was seen and examined on 08/03/2022 PCP: Pcp, No  Patient coming from: Home lives with wife via EMS  Chief Complaint:  Chief Complaint  Patient presents with   Fall   Dizziness   HPI: [name] is a 81 y.o. male with medical history significant of HTN, HLD, HFrEF(15-20%), CHB s/p AICD/PM, CAD, DM type II, CKD stage 3B, thrombocytopenia, GERD, and obesity presents after having multiple falls at home.  On patient had fallen and hit his head yesterday for which EMS was called.  He admits that he has intermittently felt lightheaded, but denied any loss of consciousness with the falls.  He did not suffer any significant trauma to his head.  Normally patient ambulates without need of assistance, but he feels like his equilibrium is off.  He thinks he has been falling more so to his left side, but feels weak all over.  Over the last 4 days he has had nasal drainage with mild productive cough.  He attributed symptoms to his allergies during this time a year.  To his knowledge he had not had any fevers at home.  Denies any wheezing, change in speech, headache, change in vision, shortness of breath, nausea, vomiting, diarrhea, abdominal pain, dysuria, leg swelling, or calf pain.  He has been eating and drinking like normal. Denies any alcohol or tobacco abuse. Patient reports that he had his initial COVID vaccination and received at least 1 booster.    Upon admission into the emergency department patient was noted to be febrile up to 102.1 F, heart rate 58-64, respirations 10-22, blood pressure 93/56-135/66, and O2 saturation currently maintained on room air.  Labs since 8/27 significant for creatinine 2.6-> 2.63, BUN 36-> 39, calcium 8.8->8.3, AST 958-> 1568, ALT 875-> 1348, CK1 67, digoxin level less than 0.2, lactic acid 1.8, and high-sensitivity troponin 43.  COVID-19 screening was positive.   Chest x-ray was clear.  CT scan of the head and cervical spine did not note any acute abnormality.  Urinalysis noted small hemoglobin, glucose, rare bacteria, and no other significant findings to suggest infection.  Patient has been given acetaminophen and a total of 500 mL normal saline IV fluids.    Review of Systems: As mentioned in the history of present illness. All other systems reviewed and are negative. 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 Andria Frames, Dr. Purcell Nails   Systolic and diastolic CHF, chronic ([place]) 08/26/2011   Thrombocytopenia ([place])    Past Surgical History:  Procedure Laterality Date   TONSILLECTOMY AND ADENOIDECTOMY Bilateral 1950   Social History:  reports that he  has quit smoking. He has never used smokeless tobacco. He reports that he does not drink alcohol and does not use drugs.  No Known Allergies  Family History  Problem Relation Age of Onset   Diabetes Father    Heart attack Father    Lung cancer Father    Hypertension Sister     Prior to Admission medications   Medication Sig Start Date End Date Taking? Authorizing  Provider  aspirin 325 MG tablet Take 325 mg by mouth.    [provider]  Azelastine HCl 0.15 % SOLN Place 1 spray into both nostrils daily. 03/12/20   Kuneff, Renee A, DO  Blood Glucose Monitoring Suppl (FREESTYLE FREEDOM LITE) w/Device KIT USE TO CHECK BLOOD SUGAR AS DIRECTED 04/11/18   [name] Deutscher, DO  carvedilol (COREG) 12.5 MG tablet Take 12.5 mg by mouth 2 (two) times daily. 05/28/20   [provider]  Cyanocobalamin (VITAMIN B-12) 500 MCG TABS Take 500 mcg by mouth daily. Patient not taking: Reported on 06/25/2020 03/14/20   Howard Pouch A, DO  digoxin (LANOXIN) 0.125 MG tablet Three times a week 06/25/20   Kuneff, Renee A, DO  ezetimibe-simvastatin (VYTORIN) 10-40 MG tablet Take 1 tablet by mouth daily. 06/25/20   Kuneff, Renee A, DO  fluticasone (FLONASE) 50 MCG/ACT nasal spray Place 1 spray into both nostrils 2 (two) times daily as needed for allergies or rhinitis. 03/12/20   Kuneff, Renee A, DO  furosemide (LASIX) 40 MG tablet Take 1 tablet (40 mg total) by mouth 2 (two) times daily. 06/25/20   Kuneff, Renee A, DO  glucose blood (FREESTYLE LITE) test strip USE AS INSTRUCTED 06/26/19   [name] Deutscher, DO  insulin aspart (NOVOLOG FLEXPEN) 100 UNIT/ML FlexPen INJECT 30 UNITS UNDER THE SKIN THREE TIMES A DAY WITH MEALS 06/25/20   Kuneff, Renee A, DO  insulin glargine (LANTUS SOLOSTAR) 100 UNIT/ML Solostar Pen INJECT 59-60 UNITS UNDER THE SKIN DAILY or as directed 06/25/20   Kuneff, Renee A, DO  ipratropium (ATROVENT) 0.03 % nasal spray Place 2 sprays into both nostrils every 12 (twelve) hours as needed for rhinitis. 04/02/20   Kuneff, Renee A, DO  isosorbide mononitrate (IMDUR) 30 MG 24 hr tablet Take 1 tablet (30 mg total) by mouth daily. 06/25/20   Raoul Pitch, Renee A, DO  Lancets (FREESTYLE) lancets USE AS INSTRUCTED 11/30/19   Vivi Barrack, MD  nitroGLYCERIN (NITROSTAT) 0.4 MG SL tablet Place 0.4 mg under the tongue.    [provider]  pantoprazole (PROTONIX) 40 MG tablet  Take 1 tablet (40 mg total) by mouth 2 (two) times daily. 12/13/19   Kuneff, Renee A, DO  sacubitril-valsartan (ENTRESTO) 49-51 MG Take 1 tablet by mouth 2 (two) times daily. 12/31/17   [provider]  spironolactone (ALDACTONE) 25 MG tablet Take 12.5 mg by mouth daily. Cardiology manages    [provider]  SURE COMFORT PEN NEEDLES 31G X 5 MM MISC USE 1 PEN NEEDLE TO INJECT UNDER THE SKIN THREE TIMES A DAY 08/28/20   Vivi Barrack, MD    Physical Exam: Vitals:   08/03/22 0430 08/03/22 0500 08/03/22 0515 08/03/22 0545  BP: (!) 111/54 106/63 (!) 105/55 121/68  Pulse: (!) 59 (!) 58 (!) 59 (!) 59  Resp: $Remo'16 18 15 16  'TJqkl$ Temp:      TempSrc:      SpO2: 93% 93% 93% 94%  Weight:      Height:       Constitutional: Male currently in  no acute distress Eyes: PERRL, lids and conjunctivae normal ENMT: Mucous membranes are moist.   Neck: normal, supple, no JVD appreciated Respiratory: clear to auscultation bilaterally, no wheezing, no crackles. Normal respiratory effort. No accessory muscle use.  Cardiovascular: Regular rate and rhythm, no murmurs / rubs / gallops. No extremity edema.   Abdomen: no tenderness, no masses palpated.   Bowel sounds positive.  Musculoskeletal: no clubbing / cyanosis. No joint deformity upper and lower extremities. Good ROM, no contractures. Normal muscle tone.  Skin: no rashes, lesions, ulcers. No induration Neurologic: CN 2-12 grossly intact.  4/5 in all extremities. Psychiatric: Normal judgment and insight. Alert and oriented x 3. Normal mood.   Data Reviewed:  ED revealed a paced rhythm at 64 bpm.  Reviewed labs, and imaging, and pertinent records as noted above in HPI. Assessment and Plan:  COVID-19 infection Acute.  Patient was noted to be febrile up to 102.1 F and testing found him to be COVID-19 positive.  O2 saturations currently maintained on room air.  Chest x-ray noted no acute abnormality.  Currently no indication for steroids. -Admit to a  telemetry bed -COVID-19 focused order set utilized -Continuous pulse oximetry with oxygen maintain O2 saturation greater than 92% -Check inflammatory markers -Check blood culture -Albuterol inhaler -Molnupiravir p.o. twice daily -Vitamin C and zinc -Antitussives as needed  Frequent fall Patient presents after having several falls and near syncopal event.  Noted that it seemed like he fell more to the left side.  CT scan of the head and cervical spine did not note any acute abnormality.  He is not on any blood thinners.  Unclear cause of symptoms at this time. -Check orthostatic vital signs -PT/OT to to evaluate and treat -Question need of MRI if AICD/pacemaker compatible.  Transaminitis Acute.  Patient found to have elevated liver enzyme AST 958-> 1568 and ALT 875-> 1348 with total bilirubin within normal limits.  AST and ALT at least 35 times the upper limit of normal.  Question possibility of acute viral hepatitis, toxin/drug related hepatitis, or other.  Records note patient had amiodarone level checked last 6/20 where it was noted to be 814. -Avoid hepatotoxic agents -Check amiodarone level, acute hepatitis panel, PT/INR, APTT, and ammonia level -GI consulted, we will follow-up for any further recommendations  Acute kidney injury superimposed on chronic kidney disease stage IIIb On admission creatinine elevated up to 2.63 with BUN 39.  Baseline creatinine previously had been around 1.6-1.8.  This is greater than a 0.3 increased to suggest that acute kidney injury.  CK was within normal limits did not test rhabdomyolysis.  Patient still able to make urine. -Strict I&Os -Avoid nephrotoxic agents -Check urine creatinine, sodium, and urea -Normal saline IV fluids at 100 mL/h -Continue to monitor kidney function daily  Essential hypertension associated with diabetes Blood pressures initially as low as 93/56.  Home blood pressure regimen includes Coreg 12.5 mg twice daily, furosemide 20  mg every other day, Entresto 49-51 mg twice daily, spironolactone 25 mg daily amiodarone 200 mg twice daily, Imdur 30 mg daily. 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  Last EF noted to be 15 to 20% with severe diffuse hypokinesis or akinesis suggested with indeterminate diastolic parameters in 0/9295. -Daily weights  Complete heart block s/p AICD/pacemaker Pacemaker had last been interrogated on 8/14 and noted  to be functioning properly. -Orders placed to interrogate pacemaker  Thrombocytopenia Chronic.  Platelet count 103. -Continue to monitor  Diabetes mellitus type 2 Last hemoglobin A1c  7.2 in 04/2022.  Home medication regimen includes Jardiance 12.5 mg daily, Lantus 35 units nightly, NovoLog 20-25 units 3 times daily with meals. -Hypoglycemic protocols -Hold Jardiance due to kidney function -Pharmacy substitution of Semglee 30 units nightly -CBGs before every meal with resistant SSI -CBGs nightly with NovoLog 0 to 5 units  Hyperlipidemia associated with type 2 diabetes Home medication regimen appears to include ezetimibe-simvastatin 10-40 mg daily. -Hold ezetimibe-simvastatin due to liver dysfunction  GERD -Continue Protonix  DVT prophylaxis: Heparin Advance Care Planning:   Code Status: Full Code  Consults: GI  Family Communication: Attempted to call his wife, but no answer  Severity of Illness: The appropriate patient status for this patient is INPATIENT. Inpatient status is judged to be reasonable and necessary in order to provide the required intensity of service to ensure the patient's safety. The patient's presenting symptoms, physical exam findings, and initial radiographic and laboratory data in the context of their chronic comorbidities is felt to  place them at high risk for further clinical deterioration. Furthermore, it is not anticipated that the patient will be medically stable for discharge from the hospital within 2 midnights of admission.   * I certify that at the point of admission it is my clinical judgment that the patient will require inpatient hospital care spanning beyond 2 midnights from the point of admission due to high intensity of service, high risk for further deterioration and high frequency of surveillance required.*  Author: Norval Morton, MD 08/03/2022 7:14 AM  For on call review [email or web address].

## 2022-08-03 NOTE — ED Notes (Signed)
Pt ambulatory without assistance. Lowest O2 was 93%

## 2022-08-03 NOTE — Progress Notes (Addendum)
[facility] Consult: 11:29 AM 08/03/2022  LOS: 0 days    Referring Provider: Dr Fuller Plan.    Primary Care Physician: [facility] on [facility] in [place].  Anastasia Pall, MD Primary Gastroenterologist: Dr. Acquanetta Sit (remote 2004)    Reason for Consultation:    HPI: [name] is a 81 y.o. male.  PMH listed below.  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  Longstanding ICD, BiV pacemaker (2012 ) in place.  STEMI.  Obesity.  Thrombocytopenia.  CKD 3B.  Ultrasound 8546 : Uncomplicated cholelithiasis, 1.9 x 1.8 x 2.1 cystic lesion in upper midline abdomen inferior to pancreas, possible exophytic pancreatic cystic lesion.  2012 CT abdomen wo contrast: no cystic lesion in pancreas. 2004 colonoscopy, screening study.  Small cecal polyp.  Pathology: Fragment of polypoid colonic mucosa.  No adenoma or hyperplastic changes   04/2022 hospitalized in [place] with VT storm and ICD shocks.  Started on amiodarone then. LFTs were normal on 05/27/2022 and again 06/29/2022.     06/12/2022 tested negative for COVID-19 after co of productive cough.  Dx acute sinusitis.   Completed a 10-day course of cefdinir. Decreased appetite for  3 or 4 weeks.  No jaundice.  No dark-colored urine, no pruritus.  No nausea vomiting.  No abdominal pain. Patient concerned that he may have contracted COVID as his wife was COVID-positive and came in for testing to ED yesterday, Covid was positive and arrangements made for admission additional labs obtained.  Recent falls at home, occasional lightheadedness, feels weak.  4 days of nasal drainage.  Previously vaccinated and at least 1 booster for COVID-19.  T. bili 1.1.  Alk phos 51.  AST/ALT 1568/1348.  Ammonia 43.  BNP 288.  Troponin I  43.  Hb 13.1.  Platelets 103 (123 ib 05/16/2022)  MCV 94.  WBCs 7.1 Hepatitis B surface Ag nonreactive.  Family history negative for colorectal cancer, peptic ulcer disease, GI bleeds, liver disease. No current alcohol and no significant history of alcohol consumption.  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 Andria Frames, Dr. Purcell Nails   Systolic and diastolic CHF, chronic ([place]) 08/26/2011   Thrombocytopenia [facility])     Past Surgical  History:  Procedure Laterality Date   TONSILLECTOMY AND ADENOIDECTOMY Bilateral 1950    Prior to Admission medications   Medication Sig Start Date End Date Taking? Authorizing Provider  aspirin 325 MG tablet Take 325 mg by mouth daily.   Yes [provider]  Azelastine HCl 0.15 % SOLN Place 1 spray into both nostrils daily. Patient taking differently: Place 1 spray into both nostrils 2 (two) times daily as needed (congestion). 03/12/20  Yes Kuneff, Renee A, DO  carvedilol (COREG) 12.5 MG tablet Take 12.5 mg by mouth 2 (two) times daily. 05/28/20  Yes [provider]  ezetimibe-simvastatin (VYTORIN) 10-40 MG tablet  Take 1 tablet by mouth daily. 06/25/20  Yes Kuneff, Renee A, DO  fluticasone (FLONASE) 50 MCG/ACT nasal spray Place 1 spray into both nostrils 2 (two) times daily as needed for allergies or rhinitis. 03/12/20  Yes Kuneff, Renee A, DO  furosemide (LASIX) 20 MG tablet Take 20 mg by mouth every other day.   Yes [provider]  insulin aspart (NOVOLOG FLEXPEN) 100 UNIT/ML FlexPen INJECT 30 UNITS UNDER THE SKIN THREE TIMES A DAY WITH MEALS Patient taking differently: Inject 20-25 Units into the skin 3 (three) times daily with meals. 06/25/20  Yes Kuneff, Renee A, DO  insulin glargine (LANTUS SOLOSTAR) 100 UNIT/ML Solostar Pen INJECT 59-60 UNITS UNDER THE SKIN DAILY or as directed Patient taking differently: Inject 35 Units into the skin at bedtime. 06/25/20  Yes Kuneff, Renee A, DO  isosorbide mononitrate (IMDUR) 30 MG 24 hr tablet Take 1 tablet (30 mg total) by mouth daily. 06/25/20  Yes Kuneff, Renee A, DO  JARDIANCE 25 MG TABS tablet Take 12.5 mg by mouth daily. 05/29/22  Yes [provider]  nitroGLYCERIN (NITROSTAT) 0.4 MG SL tablet Place 0.4 mg under the tongue every 5 (five) minutes as needed for chest pain.   Yes [provider]  PACERONE 200 MG tablet Take 200 mg by mouth 2 (two) times daily. 07/07/22  Yes [provider]  pantoprazole (PROTONIX) 40 MG tablet Take 1 tablet (40 mg total) by mouth 2 (two) times daily. Patient taking differently: Take 40 mg by mouth daily. 12/13/19  Yes Kuneff, Renee A, DO  sacubitril-valsartan (ENTRESTO) 49-51 MG Take 1 tablet by mouth 2 (two) times daily. 12/31/17  Yes [provider]  spironolactone (ALDACTONE) 25 MG tablet Take 12.5 mg by mouth daily. Cardiology manages   Yes [provider]  glucose blood (FREESTYLE LITE) test strip USE AS INSTRUCTED 06/26/19   [name] Deutscher, DO  Lancets (FREESTYLE) lancets USE AS INSTRUCTED 11/30/19   Vivi Barrack, MD  SURE COMFORT PEN NEEDLES 31G X 5 MM MISC USE 1 PEN NEEDLE TO  INJECT UNDER THE SKIN THREE TIMES A DAY 08/28/20   Vivi Barrack, MD    Scheduled Meds:  albuterol  2 puff Inhalation Q6H   vitamin C  500 mg Oral Daily   aspirin  325 mg Oral Daily   heparin  5,000 Units Subcutaneous Q8H   insulin aspart  0-20 Units Subcutaneous TID WC   insulin aspart  0-5 Units Subcutaneous QHS   insulin glargine-yfgn  30 Units Subcutaneous QHS   molnupiravir EUA  4 capsule Oral BID   pantoprazole  40 mg Oral Daily   sodium chloride flush  3 mL Intravenous Q12H   zinc sulfate  220 mg Oral Daily   Infusions:  sodium chloride 100 mL/hr at 08/03/22 0752   PRN Meds: Azelastine HCl, chlorpheniramine-HYDROcodone, fluticasone, guaiFENesin-dextromethorphan  Allergies as of 08/02/2022   (No Known Allergies)    Family History  Problem Relation Age of Onset   Diabetes Father    Heart attack Father    Lung cancer Father    Hypertension Sister     Social History   Socioeconomic History   Marital status: Married    Spouse name: Not on file   Number of children: Not on file   Years of education: Not on file   Highest education level: Not on file  Occupational History   Not on file  Tobacco Use   Smoking status: Former   Smokeless tobacco: Never   Tobacco comments:    remote at 20;  Vaping Use   Vaping Use: Never used  Substance and Sexual Activity   Alcohol use: No   Drug use: No   Sexual activity: Yes    Partners: Female  Other Topics Concern   Not on file  Social History Narrative   Marital status/children/pets: Married.   Education/employment: High school graduate.  Retired.   Safety:      -smoke alarm in the home:Yes     - wears seatbelt: No     - Feels safe in their relationships: Yes   Social Determinants of Health   Financial Resource Strain: Not on file  Food Insecurity: Not on file  Transportation Needs: Not on file  Physical Activity: Not on file  Stress: Not on file  Social Connections: Not on file  Intimate Partner  Violence: Not on file    REVIEW OF SYSTEMS: Constitutional: Some fatigue and weakness. ENT:  No nose bleeds Pulm: No shortness of breath.  Positive productive cough. CV:  No palpitations, no LE edema.  GU:  No hematuria, no frequency GI: See HPI. Heme: Denies unusual bleeding or bruising. Transfusions: None. Neuro: Falls at home, feels like his equilibrium is "off".  No headaches, no peripheral tingling or numbness Derm: Actinic keratosis especially on his face. Endocrine:  No sweats or chills.  No polyuria or dysuria Immunization: Reviewed. Travel: Not queried   PHYSICAL EXAM: Vital signs in last 24 hours: Vitals:   08/03/22 0754 08/03/22 1015  BP:  (!) 113/56  Pulse:  (!) 59  Resp:  18  Temp: 99.1 F (37.3 C)   SpO2:  93%   Wt Readings from Last 3 Encounters:  08/02/22 108.8 kg  06/25/20 108.8 kg  03/12/20 111.2 kg    General: Patient looks somewhat chronically ill but not acutely in any distress.  Resting comfortably on bed and able to answer questions easily Head: Scabs on his face Eyes: No scleral icterus.  No conjunctival pallor. Ears: No obvious hearing loss Nose: No congestion or discharge Mouth: Good dentition.  Mucosa is moist, pink, clear.  Tongue midline. Neck: No JVD Lungs: Clear bilaterally without labored breathing or cough Heart: RRR. Abdomen: Soft.  Moderately obese without distention.  Active bowel sounds.  No HSM, masses, bruits, hernias.   Rectal: Deferred Musc/Skeltl: No obvious joint redness or swelling. Extremities: CCE. Neurologic: Appropriate.  Alert and oriented x3.  Good historian.  Moves all 4 limbs without gross deficits, formal strength testing not performed.  No tremors or asterixis Skin: Scabs on the face. Nodes: No cervical adenopathy Psych: Cooperative, calm, pleasant.  Intake/Output from previous day: 08/27 0701 - 08/28 0700 In: 250 [IV Piggyback:250] Out: -  Intake/Output this shift: No intake/output data recorded.  LAB  RESULTS: Recent Labs    08/02/22 2011  WBC 7.1  HGB  13.1  HCT 40.4  PLT 103*   BMET Lab Results  Component Value Date   NA 136 08/03/2022   NA 135 08/02/2022   NA 141 03/12/2020   K 4.9 08/03/2022   K 5.0 08/02/2022   K 3.7 03/12/2020   CL 108 08/03/2022   CL 106 08/02/2022   CL 103 03/12/2020   CO2 20 (L) 08/03/2022   CO2 22 08/02/2022   CO2 27 03/12/2020   GLUCOSE 164 (H) 08/03/2022   GLUCOSE 189 (H) 08/02/2022   GLUCOSE 147 (H) 03/12/2020   BUN 39 (H) 08/03/2022   BUN 36 (H) 08/02/2022   BUN 27 (H) 03/12/2020   CREATININE 2.63 (H) 08/03/2022   CREATININE 2.60 (H) 08/02/2022   CREATININE 1.81 (H) 03/12/2020   CALCIUM 8.3 (L) 08/03/2022   CALCIUM 8.8 (L) 08/02/2022   CALCIUM 8.6 03/12/2020   CALCIUM 8.6 03/12/2020   LFT Recent Labs    08/02/22 2011 08/03/22 0452  PROT 6.0* 5.5*  ALBUMIN 3.3* 3.1*  AST 958* 1,568*  ALT 875* 1,348*  ALKPHOS 59 51  BILITOT 0.9 1.1   PT/INR No results found for: "INR", "PROTIME" Hepatitis Panel Recent Labs    08/03/22 0842  HEPBSAG NON REACTIVE   C-Diff No components found for: "CDIFF" Lipase  No results found for: "LIPASE"  Drugs of Abuse  No results found for: "LABOPIA", "COCAINSCRNUR", "LABBENZ", "AMPHETMU", "THCU", "LABBARB"   RADIOLOGY STUDIES: CT Cervical Spine Wo Contrast  Result Date: 08/02/2022 CLINICAL DATA:  Trauma. EXAM: CT CERVICAL SPINE WITHOUT CONTRAST TECHNIQUE: Multidetector CT imaging of the cervical spine was performed without intravenous contrast. Multiplanar CT image reconstructions were also generated. RADIATION DOSE REDUCTION: This exam was performed according to the departmental dose-optimization program which includes automated exposure control, adjustment of the mA and/or kV according to patient size and/or use of iterative reconstruction technique. COMPARISON:  None Available. FINDINGS: Alignment: Normal. Skull base and vertebrae: No acute fracture. No primary bone lesion or focal pathologic  process. Soft tissues and spinal canal: No prevertebral fluid or swelling. No visible canal hematoma. Disc levels: No significant central canal or neural foraminal stenosis at any level. Upper chest: Negative. Other: None. IMPRESSION: No acute fracture or traumatic subluxation of the cervical spine. Electronically Signed   By: Ronney Asters M.D.   On: 08/02/2022 21:00   CT Head Wo Contrast  Result Date: 08/02/2022 CLINICAL DATA:  81 year old male with fall and head injury yesterday. Headache. EXAM: CT HEAD WITHOUT CONTRAST TECHNIQUE: Contiguous axial images were obtained from the base of the skull through the vertex without intravenous contrast. RADIATION DOSE REDUCTION: This exam was performed according to the departmental dose-optimization program which includes automated exposure control, adjustment of the mA and/or kV according to patient size and/or use of iterative reconstruction technique. COMPARISON:  None Available. FINDINGS: Brain: No evidence of acute infarction, hemorrhage, hydrocephalus, extra-axial collection or mass lesion/mass effect. Atrophy identified. Vascular: Carotid and vertebral atherosclerotic calcifications are noted. Skull: Normal. Negative for fracture or focal lesion. Sinuses/Orbits: Mucosal thickening and mucous retention cyst within the maxillary sinuses noted. No acute abnormalities. Other: None. IMPRESSION: 1. No evidence of acute intracranial abnormality. 2. Atrophy. Electronically Signed   By: Margarette Canada M.D.   On: 08/02/2022 21:00   DG Chest 1 View  Result Date: 08/02/2022 CLINICAL DATA:  Fall and dizziness. EXAM: CHEST  1 VIEW COMPARISON:  None Available. FINDINGS: Cardiomegaly and RIGHT-sided AICD/pacemaker noted. LEFT AICD/pacemaker leads noted without generator pack. There is no evidence of focal airspace  disease, pulmonary edema, suspicious pulmonary nodule/mass, pleural effusion, or pneumothorax. No acute bony abnormalities are identified. IMPRESSION: No active  disease. Electronically Signed   By: Margarette Canada M.D.   On: 08/02/2022 20:38      IMPRESSION:   Marked transaminitis.  Started on amiodarone in May with subsequent normal LFTs on follow-up in June and July.  10-day course of cefdinir initiated July 7.  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    Thrombocytopenia, chronic, noncritical.  Chronic heart failure.  ICD, bivalve pacemaker in place.  AKI, CKD 3b.  Creatinine 2.0 - 1.8 a month ago.   Now 2.6 current GFR 24   PLAN:     Ultrasound abdomen.  Acute hepatitis serologies.  Ok for diet after ultrasound.     3 PM addendum: Abdominal ultrasound:  Gallbladder stone. There are no sonographic signs of acute cholecystitis. Increased echogenicity liver suggests fatty infiltration. There is mild nodularity of liver surface which may be a normal variation are suggested cirrhosis  Azucena Freed  08/03/2022, 11:29 AM Phone [phone]  I have taken a history, reviewed the chart and examined the patient. I performed a substantive portion of this encounter, including complete performance of at least one of the key components, in conjunction with the APP. I agree with the APP's note, impression and recommendations  81 year old male with hx of CAD, CHF with severe EF depression/VT s/p ICD, treated with amiodarone and diabetes, admitted after being found to be COVID positive with markedly elevated liver enzymes in a hepatocellular pattern.  He has some episodes of dizziness and falls, but no syncope.  Denies any viral symptoms such as sore throat, cough, rhinitis, fevers/chills.  INR slightly elevated.  Normal ammonia and mental status with no asterixis on exam. US showed coarsened echotexture possibly suggestive cirrhosis/chronic liver disease.  I suspect the patient's marked aminotransferase elevation is likely DILI (amiodarone, less likely cefdinir), although viral /COVID hepatitis also possible.  His ultrasound and thrombocytopenia suggest he may have early  cirrhosis, which would most likely be secondary to NASH given his risk factors and lack of alcohol history.  We will evaluate for other causes of chronic liver disease (autoimmune, genetic, etc) and trend his liver enzymes and INR. No indication for liver biopsy at this point.  Recommend holding amiodarone and seeking alternatives.  If no other alternatives, then would pursue liver biopsy to assess for features of amiodarone induced liver injury prior to restarting.  Cashlynn Yearwood E. Candis Schatz, MD [facility]

## 2022-08-03 NOTE — ED Notes (Signed)
GI at bedside

## 2022-08-03 NOTE — ED Provider Notes (Signed)
[facility] EMERGENCY DEPARTMENT Provider Note   CSN: [id number] Arrival date & time: 08/02/22  1959     History  Chief Complaint  Patient presents with   Fall   Dizziness    [name] is a 81 y.o. male.  The history is provided by the patient.  Patient reports that 4 days ago he began having upper respiratory symptoms.  He reports he had nasal drainage, minimal cough.  He is unaware of having any fevers.  He does report significant fatigue and "feeling loopy " He does report he has fallen but no LOC.  No ICD shocks reported. He did hit his head after he fell, but no LOC.  He is not on anticoagulation. Patient is concerned he has COVID-19 due to exposures at home. No active chest pain or shortness of breath.  No abdominal pain.  No vomiting.     Home Medications Prior to Admission medications   Medication Sig Start Date End Date Taking? Authorizing Provider  aspirin 325 MG tablet Take 325 mg by mouth.    [provider]  Azelastine HCl 0.15 % SOLN Place 1 spray into both nostrils daily. 03/12/20   Kuneff, Renee A, DO  Blood Glucose Monitoring Suppl (FREESTYLE FREEDOM LITE) w/Device KIT USE TO CHECK BLOOD SUGAR AS DIRECTED 04/11/18   [name] Deutscher, DO  carvedilol (COREG) 12.5 MG tablet Take 12.5 mg by mouth 2 (two) times daily. 05/28/20   [provider]  Cyanocobalamin (VITAMIN B-12) 500 MCG TABS Take 500 mcg by mouth daily. Patient not taking: Reported on 06/25/2020 03/14/20   Howard Pouch A, DO  digoxin (LANOXIN) 0.125 MG tablet Three times a week 06/25/20   Kuneff, Renee A, DO  ezetimibe-simvastatin (VYTORIN) 10-40 MG tablet Take 1 tablet by mouth daily. 06/25/20   Kuneff, Renee A, DO  fluticasone (FLONASE) 50 MCG/ACT nasal spray Place 1 spray into both nostrils 2 (two) times daily as needed for allergies or rhinitis. 03/12/20   Kuneff, Renee A, DO  furosemide (LASIX) 40 MG tablet Take 1 tablet (40 mg total) by mouth 2 (two) times daily. 06/25/20    Kuneff, Renee A, DO  glucose blood (FREESTYLE LITE) test strip USE AS INSTRUCTED 06/26/19   [name] Deutscher, DO  insulin aspart (NOVOLOG FLEXPEN) 100 UNIT/ML FlexPen INJECT 30 UNITS UNDER THE SKIN THREE TIMES A DAY WITH MEALS 06/25/20   Kuneff, Renee A, DO  insulin glargine (LANTUS SOLOSTAR) 100 UNIT/ML Solostar Pen INJECT 59-60 UNITS UNDER THE SKIN DAILY or as directed 06/25/20   Kuneff, Renee A, DO  ipratropium (ATROVENT) 0.03 % nasal spray Place 2 sprays into both nostrils every 12 (twelve) hours as needed for rhinitis. 04/02/20   Kuneff, Renee A, DO  isosorbide mononitrate (IMDUR) 30 MG 24 hr tablet Take 1 tablet (30 mg total) by mouth daily. 06/25/20   Raoul Pitch, Renee A, DO  Lancets (FREESTYLE) lancets USE AS INSTRUCTED 11/30/19   Vivi Barrack, MD  nitroGLYCERIN (NITROSTAT) 0.4 MG SL tablet Place 0.4 mg under the tongue.    [provider]  pantoprazole (PROTONIX) 40 MG tablet Take 1 tablet (40 mg total) by mouth 2 (two) times daily. 12/13/19   Kuneff, Renee A, DO  sacubitril-valsartan (ENTRESTO) 49-51 MG Take 1 tablet by mouth 2 (two) times daily. 12/31/17   [provider]  spironolactone (ALDACTONE) 25 MG tablet Take 12.5 mg by mouth daily. Cardiology manages    [provider]  SURE COMFORT PEN NEEDLES 31G X 5  MM MISC USE 1 PEN NEEDLE TO INJECT UNDER THE SKIN THREE TIMES A DAY 08/28/20   Vivi Barrack, MD      Allergies    Patient has no known allergies.    Review of Systems   Review of Systems  Constitutional:  Positive for fatigue.  Respiratory:  Negative for shortness of breath.   Cardiovascular:  Negative for chest pain.    Physical Exam Updated Vital Signs BP (!) 93/56 (BP Location: Left Arm)   Pulse (!) 58   Temp (!) 102.1 F (38.9 C) (Oral)   Resp 20   Ht 1.829 m (6')   Wt 108.8 kg   SpO2 94%   BMI 32.53 kg/m  Physical Exam CONSTITUTIONAL: Elderly, no acute distress HEAD: Small abrasion noted to the right side of his forehead, but no other  signs of trauma EYES: EOMI/PERRL ENMT: Mucous membranes moist NECK: supple no meningeal signs SPINE/BACK:entire spine nontender, no bruising/crepitance/stepoffs noted to spine CV: S1/S2 noted LUNGS: Lungs are clear to auscultation bilaterally, no apparent distress ABDOMEN: soft, nontender, no rebound or guarding, bowel sounds noted throughout abdomen GU:no cva tenderness NEURO: Pt is awake/alert/appropriate, moves all extremitiesx4.  No facial droop.  No arm or leg drift.  No facial droop EXTREMITIES: pulses normal/equal, full ROM All other extremities/joints palpated/ranged and nontender SKIN: warm, color normal PSYCH: no abnormalities of mood noted, alert and oriented to situation  ED Results / Procedures / Treatments   Labs (all labs ordered are listed, but only abnormal results are displayed) Labs Reviewed  RESP PANEL BY RT-PCR (FLU A&B, COVID) ARPGX2 - Abnormal; Notable for the following components:      Result Value   SARS Coronavirus 2 by RT PCR POSITIVE (*)    All other components within normal limits  COMPREHENSIVE METABOLIC PANEL - Abnormal; Notable for the following components:   Glucose, Bld 189 (*)    BUN 36 (*)    Creatinine, Ser 2.60 (*)    Calcium 8.8 (*)    Total Protein 6.0 (*)    Albumin 3.3 (*)    AST 958 (*)    ALT 875 (*)    GFR, Estimated 24 (*)    All other components within normal limits  CBC WITH DIFFERENTIAL/PLATELET - Abnormal; Notable for the following components:   Platelets 103 (*)    Lymphs Abs 0.6 (*)    All other components within normal limits  URINALYSIS, ROUTINE W REFLEX MICROSCOPIC - Abnormal; Notable for the following components:   APPearance HAZY (*)    Glucose, UA >=500 (*)    Hgb urine dipstick SMALL (*)    Protein, ur 30 (*)    Bacteria, UA RARE (*)    All other components within normal limits  DIGOXIN LEVEL - Abnormal; Notable for the following components:   Digoxin Level <0.2 (*)    All other components within normal limits   COMPREHENSIVE METABOLIC PANEL - Abnormal; Notable for the following components:   CO2 20 (*)    Glucose, Bld 164 (*)    BUN 39 (*)    Creatinine, Ser 2.63 (*)    Calcium 8.3 (*)    Total Protein 5.5 (*)    Albumin 3.1 (*)    AST 1,568 (*)    ALT 1,348 (*)    GFR, Estimated 24 (*)    All other components within normal limits  CBG MONITORING, ED - Abnormal; Notable for the following components:   Glucose-Capillary 188 (*)  All other components within normal limits  TROPONIN I (HIGH SENSITIVITY) - Abnormal; Notable for the following components:   Troponin I (High Sensitivity) 43 (*)    All other components within normal limits  LACTIC ACID, PLASMA  CK    EKG EKG Interpretation  Date/Time:  Monday August 03 2022 00:01:17 EDT Ventricular Rate:  64 PR Interval:  130 QRS Duration: 176 QT Interval:  482 QTC Calculation: 498 R Axis:   213 Text Interpretation: paced rhythm Nonspecific intraventricular conduction delay Interpretation limited secondary to artifact Confirmed by Ripley Fraise [phone]) on 08/03/2022 12:34:52 AM  Radiology CT Cervical Spine Wo Contrast  Result Date: 08/02/2022 CLINICAL DATA:  Trauma. EXAM: CT CERVICAL SPINE WITHOUT CONTRAST TECHNIQUE: Multidetector CT imaging of the cervical spine was performed without intravenous contrast. Multiplanar CT image reconstructions were also generated. RADIATION DOSE REDUCTION: This exam was performed according to the departmental dose-optimization program which includes automated exposure control, adjustment of the mA and/or kV according to patient size and/or use of iterative reconstruction technique. COMPARISON:  None Available. FINDINGS: Alignment: Normal. Skull base and vertebrae: No acute fracture. No primary bone lesion or focal pathologic process. Soft tissues and spinal canal: No prevertebral fluid or swelling. No visible canal hematoma. Disc levels: No significant central canal or neural foraminal stenosis at any  level. Upper chest: Negative. Other: None. IMPRESSION: No acute fracture or traumatic subluxation of the cervical spine. Electronically Signed   By: Ronney Asters M.D.   On: 08/02/2022 21:00   CT Head Wo Contrast  Result Date: 08/02/2022 CLINICAL DATA:  81 year old male with fall and head injury yesterday. Headache. EXAM: CT HEAD WITHOUT CONTRAST TECHNIQUE: Contiguous axial images were obtained from the base of the skull through the vertex without intravenous contrast. RADIATION DOSE REDUCTION: This exam was performed according to the departmental dose-optimization program which includes automated exposure control, adjustment of the mA and/or kV according to patient size and/or use of iterative reconstruction technique. COMPARISON:  None Available. FINDINGS: Brain: No evidence of acute infarction, hemorrhage, hydrocephalus, extra-axial collection or mass lesion/mass effect. Atrophy identified. Vascular: Carotid and vertebral atherosclerotic calcifications are noted. Skull: Normal. Negative for fracture or focal lesion. Sinuses/Orbits: Mucosal thickening and mucous retention cyst within the maxillary sinuses noted. No acute abnormalities. Other: None. IMPRESSION: 1. No evidence of acute intracranial abnormality. 2. Atrophy. Electronically Signed   By: Margarette Canada M.D.   On: 08/02/2022 21:00   DG Chest 1 View  Result Date: 08/02/2022 CLINICAL DATA:  Fall and dizziness. EXAM: CHEST  1 VIEW COMPARISON:  None Available. FINDINGS: Cardiomegaly and RIGHT-sided AICD/pacemaker noted. LEFT AICD/pacemaker leads noted without generator pack. There is no evidence of focal airspace disease, pulmonary edema, suspicious pulmonary nodule/mass, pleural effusion, or pneumothorax. No acute bony abnormalities are identified. IMPRESSION: No active disease. Electronically Signed   By: Margarette Canada M.D.   On: 08/02/2022 20:38    Procedures Procedures    Medications Ordered in ED Medications  sodium chloride 0.9 % bolus 250  mL (0 mLs Intravenous Stopped 08/03/22 0058)  acetaminophen (TYLENOL) tablet 650 mg (650 mg Oral Given 08/03/22 0029)  sodium chloride 0.9 % bolus 250 mL (0 mLs Intravenous Stopped 08/03/22 0330)    ED Course/ Medical Decision Making/ A&P Clinical Course as of 08/03/22 2707  Sun Aug 02, 2022  2357 Creatinine(!): 2.60 Acute kidney injury [DW]  2357 Glucose(!): 189 Hyperglycemia noted [DW]  2358 AST(!): 958 Transaminitis [DW]  2358 Platelets(!): 103 Thrombocytopenia [DW]  2359 Patient with a history of  ischemic cardiomyopathy with ejection fraction around 15%.  Baseline creatinine was around 1.8.  Patient now has evidence of acute on chronic renal insufficiency.  He also has elevated LFTs of unclear etiology.  Patient has only mildly worsening thrombocytopenia.  On clinical exam he is overall in no acute distress and denies any significant complaints.  We will give gentle hydration and reassess.  We will also interrogate his ICD [DW]  Mon Aug 03, 2022  0105 Medtr[employer] ICD interrogation report does not reveal any arrhythmias or events in the past 2 weeks [DW]  0240 Patient resting comfortably, no acute distress. he reports he felt well with ambulation.  Patient would prefer to be discharged.  He is overall well-appearing.  Plan to give another small fluid bolus and will recheck labs [DW]  0545 Renal and liver function are both worsening.  Given his age, comorbidities, worsening labs, patient will be admitted for further evaluation and management. [DW]  [phone] Patient denies any abdominal pain or vomiting.  Denies any Tylenol use consistently.  Denies any alcohol abuse [DW]  0619 Discussed with Dr. Myna Hidalgo for admission.  Will defer treatment for COVID-19 to inpatient team.  Due to renal dysfunction, he was not given Paxlovid. [DW]  3662 Unclear cause in his elevation of LFTs.  Patient is on amiodarone at baseline.  This will need to be monitored and further evaluated in the hospital [DW]    Clinical  Course User Index [DW] Ripley Fraise, MD                           Medical Decision Making Amount and/or Complexity of Data Reviewed Labs: ordered. Decision-making details documented in ED Course.  Risk OTC drugs. Decision regarding hospitalization.   This patient presents to the ED for concern of head injury and weakness, this involves an extensive number of treatment options, and is a complaint that carries with it a high risk of complications and morbidity.  The differential diagnosis includes but is not limited to CVA, intracranial hemorrhage, subdural hematoma, subarachnoid, skull fracture, electrolyte abnormality, acute coronary syndrome  Comorbidities that complicate the patient evaluation: Patient's presentation is complicated by their history of ischemic cardiomyopathy   Additional history obtained: I attempted to call family but no answer Records reviewed Care Everywhere/External Records  Lab Tests: I Ordered, and personally interpreted labs.  The pertinent results include: Acute kidney injury, hyperglycemia  Imaging Studies ordered: I ordered imaging studies including CT scan head and C-spine and X-ray chest   I independently visualized and interpreted imaging which showed no acute findings I agree with the radiologist interpretation  Cardiac Monitoring: The patient was maintained on a cardiac monitor.  I personally viewed and interpreted the cardiac monitor which showed an underlying rhythm of:   paced rhythm  Medicines ordered and prescription drug management: I ordered medication including IV fluid for dehydration Reevaluation of the patient after these medicines showed that the patient    improved  Critical Interventions:  IV fluids, admission  Consultations Obtained: I requested consultation with the admitting physician [facility] , and discussed  findings as well as pertinent plan - they recommend: admit  Reevaluation: After the interventions noted above,  I reevaluated the patient and found that they have :improved  Complexity of problems addressed: Patient's presentation is most consistent with  acute presentation with potential threat to life or bodily function  Disposition: After consideration of the diagnostic results and the patient's response to treatment,  I feel that the patent would benefit from admission   .           Final Clinical Impression(s) / ED Diagnoses Final diagnoses:  COVID-19  Dehydration  AKI (acute kidney injury) ([facility])  Transaminitis    Rx / [place] Orders ED Discharge Orders     None         Ripley Fraise, MD 08/03/22 [phone]

## 2022-08-03 NOTE — ED Notes (Signed)
Medtronic pacemaker interrogated °

## 2022-08-03 NOTE — ED Notes (Signed)
Called lab and spoke w/ Maudie Mercury who said they will add the hepatitis panel to the previous collection

## 2022-08-03 NOTE — ED Provider Notes (Incomplete)
[facility] EMERGENCY DEPARTMENT Provider Note   CSN: [id number] Arrival date & time: 08/02/22  1959     History {Add pertinent medical, surgical, social history, OB history to HPI:1} Chief Complaint  Patient presents with  . Fall  . Dizziness    [name] is a 81 y.o. male.  The history is provided by the patient.  Patient reports that 4 days ago he began having upper respiratory symptoms.  He reports he had nasal drainage, minimal cough.  He is unaware of having any fevers.  He does report significant fatigue and "feeling loopy "     Home Medications Prior to Admission medications   Medication Sig Start Date End Date Taking? Authorizing Provider  aspirin 325 MG tablet Take 325 mg by mouth.    [provider]  Azelastine HCl 0.15 % SOLN Place 1 spray into both nostrils daily. 03/12/20   Kuneff, Renee A, DO  Blood Glucose Monitoring Suppl (FREESTYLE FREEDOM LITE) w/Device KIT USE TO CHECK BLOOD SUGAR AS DIRECTED 04/11/18   Helane Rima, DO  carvedilol (COREG) 12.5 MG tablet Take 12.5 mg by mouth 2 (two) times daily. 05/28/20   [provider]  Cyanocobalamin (VITAMIN B-12) 500 MCG TABS Take 500 mcg by mouth daily. Patient not taking: Reported on 06/25/2020 03/14/20   Felix Pacini A, DO  digoxin (LANOXIN) 0.125 MG tablet Three times a week 06/25/20   Kuneff, Renee A, DO  ezetimibe-simvastatin (VYTORIN) 10-40 MG tablet Take 1 tablet by mouth daily. 06/25/20   Kuneff, Renee A, DO  fluticasone (FLONASE) 50 MCG/ACT nasal spray Place 1 spray into both nostrils 2 (two) times daily as needed for allergies or rhinitis. 03/12/20   Kuneff, Renee A, DO  furosemide (LASIX) 40 MG tablet Take 1 tablet (40 mg total) by mouth 2 (two) times daily. 06/25/20   Kuneff, Renee A, DO  glucose blood (FREESTYLE LITE) test strip USE AS INSTRUCTED 06/26/19   Helane Rima, DO  insulin aspart (NOVOLOG FLEXPEN) 100 UNIT/ML FlexPen INJECT 30 UNITS UNDER THE SKIN THREE TIMES A DAY  WITH MEALS 06/25/20   Kuneff, Renee A, DO  insulin glargine (LANTUS SOLOSTAR) 100 UNIT/ML Solostar Pen INJECT 59-60 UNITS UNDER THE SKIN DAILY or as directed 06/25/20   Kuneff, Renee A, DO  ipratropium (ATROVENT) 0.03 % nasal spray Place 2 sprays into both nostrils every 12 (twelve) hours as needed for rhinitis. 04/02/20   Kuneff, Renee A, DO  isosorbide mononitrate (IMDUR) 30 MG 24 hr tablet Take 1 tablet (30 mg total) by mouth daily. 06/25/20   Claiborne Billings, Renee A, DO  Lancets (FREESTYLE) lancets USE AS INSTRUCTED 11/30/19   Ardith Dark, MD  nitroGLYCERIN (NITROSTAT) 0.4 MG SL tablet Place 0.4 mg under the tongue.    [provider]  pantoprazole (PROTONIX) 40 MG tablet Take 1 tablet (40 mg total) by mouth 2 (two) times daily. 12/13/19   Kuneff, Renee A, DO  sacubitril-valsartan (ENTRESTO) 49-51 MG Take 1 tablet by mouth 2 (two) times daily. 12/31/17   [provider]  spironolactone (ALDACTONE) 25 MG tablet Take 12.5 mg by mouth daily. Cardiology manages    [provider]  SURE COMFORT PEN NEEDLES 31G X 5 MM MISC USE 1 PEN NEEDLE TO INJECT UNDER THE SKIN THREE TIMES A DAY 08/28/20   Ardith Dark, MD      Allergies    Patient has no known allergies.    Review of Systems   Review of Systems  Physical Exam Updated Vital Signs BP (!) 93/56 (BP Location: Left Arm)   Pulse (!) 58   Temp (!) 102.1 F (38.9 C) (Oral)   Resp 20   Ht 1.829 m (6')   Wt 108.8 kg   SpO2 94%   BMI 32.53 kg/m  Physical Exam  ED Results / Procedures / Treatments   Labs (all labs ordered are listed, but only abnormal results are displayed) Labs Reviewed  RESP PANEL BY RT-PCR (FLU A&B, COVID) ARPGX2 - Abnormal; Notable for the following components:      Result Value   SARS Coronavirus 2 by RT PCR POSITIVE (*)    All other components within normal limits  COMPREHENSIVE METABOLIC PANEL - Abnormal; Notable for the following components:   Glucose, Bld 189 (*)    BUN 36 (*)     Creatinine, Ser 2.60 (*)    Calcium 8.8 (*)    Total Protein 6.0 (*)    Albumin 3.3 (*)    AST 958 (*)    ALT 875 (*)    GFR, Estimated 24 (*)    All other components within normal limits  CBC WITH DIFFERENTIAL/PLATELET - Abnormal; Notable for the following components:   Platelets 103 (*)    Lymphs Abs 0.6 (*)    All other components within normal limits  CBG MONITORING, ED - Abnormal; Notable for the following components:   Glucose-Capillary 188 (*)    All other components within normal limits  TROPONIN I (HIGH SENSITIVITY) - Abnormal; Notable for the following components:   Troponin I (High Sensitivity) 43 (*)    All other components within normal limits  URINALYSIS, ROUTINE W REFLEX MICROSCOPIC  DIGOXIN LEVEL  LACTIC ACID, PLASMA  LACTIC ACID, PLASMA    EKG None  Radiology CT Cervical Spine Wo Contrast  Result Date: 08/02/2022 CLINICAL DATA:  Trauma. EXAM: CT CERVICAL SPINE WITHOUT CONTRAST TECHNIQUE: Multidetector CT imaging of the cervical spine was performed without intravenous contrast. Multiplanar CT image reconstructions were also generated. RADIATION DOSE REDUCTION: This exam was performed according to the departmental dose-optimization program which includes automated exposure control, adjustment of the mA and/or kV according to patient size and/or use of iterative reconstruction technique. COMPARISON:  None Available. FINDINGS: Alignment: Normal. Skull base and vertebrae: No acute fracture. No primary bone lesion or focal pathologic process. Soft tissues and spinal canal: No prevertebral fluid or swelling. No visible canal hematoma. Disc levels: No significant central canal or neural foraminal stenosis at any level. Upper chest: Negative. Other: None. IMPRESSION: No acute fracture or traumatic subluxation of the cervical spine. Electronically Signed   By: Ronney Asters M.D.   On: 08/02/2022 21:00   CT Head Wo Contrast  Result Date: 08/02/2022 CLINICAL DATA:  81 year old  male with fall and head injury yesterday. Headache. EXAM: CT HEAD WITHOUT CONTRAST TECHNIQUE: Contiguous axial images were obtained from the base of the skull through the vertex without intravenous contrast. RADIATION DOSE REDUCTION: This exam was performed according to the departmental dose-optimization program which includes automated exposure control, adjustment of the mA and/or kV according to patient size and/or use of iterative reconstruction technique. COMPARISON:  None Available. FINDINGS: Brain: No evidence of acute infarction, hemorrhage, hydrocephalus, extra-axial collection or mass lesion/mass effect. Atrophy identified. Vascular: Carotid and vertebral atherosclerotic calcifications are noted. Skull: Normal. Negative for fracture or focal lesion. Sinuses/Orbits: Mucosal thickening and mucous retention cyst within the maxillary sinuses noted. No acute abnormalities. Other: None. IMPRESSION: 1. No evidence of acute intracranial abnormality.  2. Atrophy. Electronically Signed   By: Margarette Canada M.D.   On: 08/02/2022 21:00   DG Chest 1 View  Result Date: 08/02/2022 CLINICAL DATA:  Fall and dizziness. EXAM: CHEST  1 VIEW COMPARISON:  None Available. FINDINGS: Cardiomegaly and RIGHT-sided AICD/pacemaker noted. LEFT AICD/pacemaker leads noted without generator pack. There is no evidence of focal airspace disease, pulmonary edema, suspicious pulmonary nodule/mass, pleural effusion, or pneumothorax. No acute bony abnormalities are identified. IMPRESSION: No active disease. Electronically Signed   By: Margarette Canada M.D.   On: 08/02/2022 20:38    Procedures Procedures  {Document cardiac monitor, telemetry assessment procedure when appropriate:1}  Medications Ordered in ED Medications  sodium chloride 0.9 % bolus 250 mL (has no administration in time range)  acetaminophen (TYLENOL) tablet 650 mg (has no administration in time range)    ED Course/ Medical Decision Making/ A&P Clinical Course as of  08/03/22 0001  Sun Aug 02, 2022  2357 Creatinine(!): 2.60 Acute kidney injury [DW]  2357 Glucose(!): 189 Hyperglycemia noted [DW]  2358 AST(!): 958 Transaminitis [DW]  2358 Platelets(!): 103 Thrombocytopenia [DW]  2359 Patient with a history of ischemic cardiomyopathy with ejection fraction around 15%.  Baseline creatinine was around 1.8.  Patient now has evidence of acute on chronic renal insufficiency.  He also has elevated LFTs of unclear etiology.  Patient has only mildly worsening thrombocytopenia.  On clinical exam he is overall in no acute distress and denies any significant complaints.  We will give gentle hydration and reassess.  We will also interrogate his ICD [DW]    Clinical Course User Index [DW] Ripley Fraise, MD                           Medical Decision Making Amount and/or Complexity of Data Reviewed Labs: ordered. Decision-making details documented in ED Course.  Risk OTC drugs.   ***  {Document critical care time when appropriate:1} {Document review of labs and clinical decision tools ie heart score, Chads2Vasc2 etc:1}  {Document your independent review of radiology images, and any outside records:1} {Document your discussion with family members, caretakers, and with consultants:1} {Document social determinants of health affecting pt's care:1} {Document your decision making why or why not admission, treatments were needed:1} Final Clinical Impression(s) / ED Diagnoses Final diagnoses:  None    Rx / DC Orders ED Discharge Orders     None

## 2022-08-04 DIAGNOSIS — I5022 Chronic systolic (congestive) heart failure: Secondary | ICD-10-CM

## 2022-08-04 DIAGNOSIS — U071 COVID-19: Secondary | ICD-10-CM | POA: Diagnosis not present

## 2022-08-04 DIAGNOSIS — I472 Ventricular tachycardia, unspecified: Secondary | ICD-10-CM

## 2022-08-04 DIAGNOSIS — I255 Ischemic cardiomyopathy: Secondary | ICD-10-CM

## 2022-08-04 LAB — ALPHA-1-ANTITRYPSIN: A-1 Antitrypsin, Ser: 138 mg/dL (ref 101–187)

## 2022-08-04 LAB — COMPREHENSIVE METABOLIC PANEL
ALT: 1450 U/L — ABNORMAL HIGH (ref 0–44)
AST: 1288 U/L — ABNORMAL HIGH (ref 15–41)
Albumin: 2.9 g/dL — ABNORMAL LOW (ref 3.5–5.0)
Alkaline Phosphatase: 51 U/L (ref 38–126)
Anion gap: 7 (ref 5–15)
BUN: 37 mg/dL — ABNORMAL HIGH (ref 8–23)
CO2: 20 mmol/L — ABNORMAL LOW (ref 22–32)
Calcium: 7.8 mg/dL — ABNORMAL LOW (ref 8.9–10.3)
Chloride: 108 mmol/L (ref 98–111)
Creatinine, Ser: 2.15 mg/dL — ABNORMAL HIGH (ref 0.61–1.24)
GFR, Estimated: 30 mL/min — ABNORMAL LOW (ref >60)
Glucose, Bld: 134 mg/dL — ABNORMAL HIGH (ref 70–99)
Potassium: 4.3 mmol/L (ref 3.5–5.1)
Sodium: 135 mmol/L (ref 135–145)
Total Bilirubin: 1 mg/dL (ref 0.3–1.2)
Total Protein: 5.3 g/dL — ABNORMAL LOW (ref 6.5–8.1)

## 2022-08-04 LAB — CBC
HCT: 40.8 % (ref 39.0–52.0)
Hemoglobin: 13.8 g/dL (ref 13.0–17.0)
MCH: 30.6 pg (ref 26.0–34.0)
MCHC: 33.8 g/dL (ref 30.0–36.0)
MCV: 90.5 fL (ref 80.0–100.0)
Platelets: 88 10*3/uL — ABNORMAL LOW (ref 150–400)
RBC: 4.51 MIL/uL (ref 4.22–5.81)
RDW: 14.7 % (ref 11.5–15.5)
WBC: 3.9 10*3/uL — ABNORMAL LOW (ref 4.0–10.5)
nRBC: 0 % (ref 0.0–0.2)

## 2022-08-04 LAB — IGG: IgG (Immunoglobin G), Serum: 736 mg/dL (ref 603–1613)

## 2022-08-04 LAB — GLUCOSE, CAPILLARY
Glucose-Capillary: 117 mg/dL — ABNORMAL HIGH (ref 70–99)
Glucose-Capillary: 118 mg/dL — ABNORMAL HIGH (ref 70–99)
Glucose-Capillary: 137 mg/dL — ABNORMAL HIGH (ref 70–99)

## 2022-08-04 LAB — ANA W/REFLEX IF POSITIVE: Anti Nuclear Antibody (ANA): NEGATIVE

## 2022-08-04 LAB — MITOCHONDRIAL ANTIBODIES: Mitochondrial M2 Ab, IgG: <20 Units (ref 0.0–20.0)

## 2022-08-04 LAB — CERULOPLASMIN: Ceruloplasmin: 18.8 mg/dL (ref 16.0–31.0)

## 2022-08-04 LAB — MAGNESIUM: Magnesium: 2.1 mg/dL (ref 1.7–2.4)

## 2022-08-04 LAB — PHOSPHORUS: Phosphorus: 3.8 mg/dL (ref 2.5–4.6)

## 2022-08-04 LAB — ANTI-SMOOTH MUSCLE ANTIBODY, IGG: F-Actin IgG: 10 Units (ref 0–19)

## 2022-08-04 MED ORDER — INSULIN ASPART 100 UNIT/ML IJ SOLN
0.0000 [IU] | Freq: Three times a day (TID) | INTRAMUSCULAR | Status: DC
  Administered 2022-08-05: 2 [IU] via SUBCUTANEOUS

## 2022-08-04 MED ORDER — ONDANSETRON HCL 4 MG/2ML IJ SOLN
4.0000 mg | Freq: Four times a day (QID) | INTRAMUSCULAR | Status: DC | PRN
  Administered 2022-08-04 – 2022-08-05 (×2): 4 mg via INTRAVENOUS
  Filled 2022-08-04 (×2): qty 2

## 2022-08-04 MED ORDER — INSULIN ASPART 100 UNIT/ML IJ SOLN: 0.0000 [IU] | Freq: Every day | INTRAMUSCULAR | Status: DC

## 2022-08-04 MED ORDER — ALBUTEROL SULFATE HFA 108 (90 BASE) MCG/ACT IN AERS: 2.0000 | INHALATION_SPRAY | Freq: Four times a day (QID) | RESPIRATORY_TRACT | Status: DC | PRN

## 2022-08-04 MED ORDER — INSULIN GLARGINE-YFGN 100 UNIT/ML ~~LOC~~ SOLN
25.0000 [IU] | Freq: Every day | SUBCUTANEOUS | Status: DC
  Administered 2022-08-04: 25 [IU] via SUBCUTANEOUS
  Filled 2022-08-04 (×2): qty 0.25

## 2022-08-04 MED ORDER — ASPIRIN 81 MG PO TBEC
81.0000 mg | DELAYED_RELEASE_TABLET | Freq: Every day | ORAL | Status: DC
  Administered 2022-08-04 – 2022-08-05 (×2): 81 mg via ORAL
  Filled 2022-08-04 (×2): qty 1

## 2022-08-04 NOTE — Hospital Course (Signed)
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  Presented to the hospital with complaints of fall and dizziness.  Found to have acute drug-induced liver injury with LFTs in 1000 and incidental COVID

## 2022-08-04 NOTE — Consult Note (Signed)
Cardiology Consultation   Patient ID: CLINE DRAHEIM MRN: [id number]; DOB: Aug 12, 1941  Admit date: 08/02/2022 Date of Consult: 08/04/2022  PCP:  Merryl Hacker, No   [facility] Providers Cardiologist:  None    Patient Profile:   [name] is a 81 y.o. male with a hx of CAD, ICM, chronic CHF (combined) , DM, HTN, HLD, VHD (described as severe MR), CHB/ICD. 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 Posey Pronto.  History of Present Illness:   [name] is followed at [facility] for cardiology, last seen 05/20/22, they discuss a recent hospitalization in [place] for VT storm/ICD shocks and was started on amiodarone, had his mag corrected. Report his Cardiolite was negative for ischemia, but had LV dysfunction. Historically: Abnormal nuclear stress test Sept 2012, extensive anteroapical and inferior infarcts, nonviable EF 18% 08/26/2011  No changes were made at this visit  His hospital d/c note reports that he had a negative nuclear stress test and no cath was pursued with neg delta trop  06/29/22 AST 15, ALT 23  He was admitted yesterday with progressive weakness, gait instability , lightheaded spells, cough and sinus congestion. He was febrile 102.1 and COVID +.  LFTs noted to be high and increasing and GI consulted EP is being asked to see and provide recommendations regarding his amiodarone  LABS K+ 5.0, 4.9, 4.3 BUN/Creat 35/2.60 > 39/2.63 > 37/2.15  (baseline looks 1.8-2.0) AST 958 > 1568 ALT 875 > 1348 HS Trop 24, 43, 49 WBC 7.1 H/H 13/40 Plts 103  BNP 288  The patient is seen/examined by Dr. Quentin Ore No CP, palpitations Device is R sided and reports that he has abandoned leads on the L as well.  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 Laurance Flatten   Congestive heart failure ([place]) 09/28/2017   DM (diabetes mellitus), type 2 ([place]) 09/04/2013   Enthesopathy of hip region 01/04/2015   GERD (gastroesophageal reflux disease) 09/28/2017   History of actinic keratoses 09/12/2015   Hyperlipidemia 09/28/2017   Hypertension associated with diabetes ([place]) 09/04/2013   Mitral valve incompetence 09/28/2017   Obesity (BMI 30-39.9) 05/12/2017   Old myocardial infarction 09/28/2017   Overview:  1993, 2003, Dr. Andria Frames, Dr. Purcell Nails   Systolic and diastolic CHF, chronic ([place]) 08/26/2011   Thrombocytopenia ([place])     Past Surgical History:  Procedure Laterality Date   TONSILLECTOMY AND ADENOIDECTOMY Bilateral 1950     Home Medications:  Prior to Admission medications   Medication Sig Start Date End Date Taking? Authorizing Provider  aspirin 325 MG tablet Take 325 mg by mouth daily.   Yes [provider]  Azelastine HCl 0.15 % SOLN Place 1 spray into both nostrils daily. Patient taking differently: Place 1 spray into both nostrils 2 (two) times daily as needed (congestion). 03/12/20  Yes Kuneff, Nathanel Tallman A, DO  carvedilol (COREG) 12.5 MG tablet Take 12.5 mg by mouth 2 (two) times daily. 05/28/20  Yes [provider]  ezetimibe-simvastatin (VYTORIN) 10-40 MG tablet Take 1 tablet by mouth daily. 06/25/20  Yes Howard Pouch  A, DO  fluticasone (FLONASE) 50 MCG/ACT nasal spray Place 1 spray into both nostrils 2 (two) times daily as needed for allergies or rhinitis. 03/12/20  Yes Kuneff, Lousie Calico A, DO  furosemide (LASIX) 20 MG tablet Take 20 mg by mouth every other day.   Yes [provider]  insulin aspart (NOVOLOG FLEXPEN) 100 UNIT/ML FlexPen INJECT 30 UNITS UNDER THE SKIN THREE TIMES A DAY WITH MEALS Patient  taking differently: Inject 20-25 Units into the skin 3 (three) times daily with meals. 06/25/20  Yes Kuneff, Sargent Mankey A, DO  insulin glargine (LANTUS SOLOSTAR) 100 UNIT/ML Solostar Pen INJECT 59-60 UNITS UNDER THE SKIN DAILY or as directed Patient taking differently: Inject 35 Units into the skin at bedtime. 06/25/20  Yes Kuneff, Emmalyne Giacomo A, DO  isosorbide mononitrate (IMDUR) 30 MG 24 hr tablet Take 1 tablet (30 mg total) by mouth daily. 06/25/20  Yes Kuneff, Marqual Mi A, DO  JARDIANCE 25 MG TABS tablet Take 12.5 mg by mouth daily. 05/29/22  Yes [provider]  nitroGLYCERIN (NITROSTAT) 0.4 MG SL tablet Place 0.4 mg under the tongue every 5 (five) minutes as needed for chest pain.   Yes [provider]  PACERONE 200 MG tablet Take 200 mg by mouth 2 (two) times daily. 07/07/22  Yes [provider]  pantoprazole (PROTONIX) 40 MG tablet Take 1 tablet (40 mg total) by mouth 2 (two) times daily. Patient taking differently: Take 40 mg by mouth daily. 12/13/19  Yes Kuneff, Cranston Koors A, DO  sacubitril-valsartan (ENTRESTO) 49-51 MG Take 1 tablet by mouth 2 (two) times daily. 12/31/17  Yes [provider]  spironolactone (ALDACTONE) 25 MG tablet Take 12.5 mg by mouth daily. Cardiology manages   Yes [provider]  glucose blood (FREESTYLE LITE) test strip USE AS INSTRUCTED 06/26/19   [name] Deutscher, DO  Lancets (FREESTYLE) lancets USE AS INSTRUCTED 11/30/19   Vivi Barrack, MD  SURE COMFORT PEN NEEDLES 31G X 5 MM MISC USE 1 PEN NEEDLE TO INJECT UNDER THE SKIN THREE TIMES A DAY 08/28/20   Vivi Barrack, MD    Inpatient Medications: Scheduled Meds:  vitamin C  500 mg Oral Daily   aspirin EC  81 mg Oral Daily   insulin aspart  0-15 Units Subcutaneous TID WC   insulin aspart  0-5 Units Subcutaneous QHS   insulin glargine-yfgn  25 Units Subcutaneous QHS   molnupiravir EUA  4 capsule Oral BID   pantoprazole  40 mg Oral Daily   sodium chloride flush  3 mL Intravenous Q12H   zinc  sulfate  220 mg Oral Daily   Continuous Infusions:  PRN Meds: albuterol, azelastine, chlorpheniramine-HYDROcodone, fluticasone, guaiFENesin-dextromethorphan, ondansetron (ZOFRAN) IV  Allergies:   No Known Allergies  Social History:   Social History   Socioeconomic History   Marital status: Married    Spouse name: Not on file   Number of children: Not on file   Years of education: Not on file   Highest education level: Not on file  Occupational History   Not on file  Tobacco Use   Smoking status: Former   Smokeless tobacco: Never   Tobacco comments:    remote at 20;  Vaping Use   Vaping Use: Never used  Substance and Sexual Activity   Alcohol use: No   Drug use: No   Sexual activity: Yes    Partners: Female  Other Topics Concern   Not on file  Social History Narrative   Marital status/children/pets: Married.  Education/employment: High school graduate.  Retired.   Safety:      -smoke alarm in the home:Yes     - wears seatbelt: No     - Feels safe in their relationships: Yes   Social Determinants of Health   Financial Resource Strain: Not on file  Food Insecurity: Not on file  Transportation Needs: Not on file  Physical Activity: Not on file  Stress: Not on file  Social Connections: Not on file  Intimate Partner Violence: Not on file    Family History:   Family History  Problem Relation Age of Onset   Diabetes Father    Heart attack Father    Lung cancer Father    Hypertension Sister      ROS:  Please see the history of present illness.  All other ROS reviewed and negative.     Physical Exam/Data:   Vitals:   08/03/22 2000 08/03/22 2106 08/04/22 0700 08/04/22 0954  BP: 129/71 120/74 109/66 115/66  Pulse: (!) 59 (!) 57 60 (!) 59  Resp: '17 18 18 18  '$ Temp:  99.4 F (37.4 C) 98.9 F (37.2 C) 98 F (36.7 C)  TempSrc:   Oral Oral  SpO2: 94% 96% 94% 95%  Weight:      Height:        Intake/Output Summary (Last 24 hours) at 08/04/2022 1350 Last  data filed at 08/04/2022 1200 Gross per 24 hour  Intake 320 ml  Output 2120 ml  Net -1800 ml      08/02/2022    8:03 PM 06/25/2020    7:54 AM 03/12/2020    8:52 AM  Last 3 Weights  Weight (lbs) 239 lb 13.8 oz 239 lb 12.8 oz 245 lb 2 oz  Weight (kg) 108.8 kg 108.773 kg 111.188 kg     Body mass index is 32.53 kg/m.  General:  Well nourished, well developed, in no acute distress, not on O2 HEENT: normal Neck: no JVD Vascular: No carotid bruits; Distal pulses 2+ bilaterally Cardiac:  RRR; no murmurs, gallops or rubs Lungs:  CTA b/l, no wheezing, rhonchi or rales  Abd: soft, nontender Ext: no edema Musculoskeletal:  No deformities Skin: warm and dry  Neuro: no focal abnormalities noted Psych:  Normal affect   EKG:  The EKG was personally reviewed and demonstrates:    SR 64bpm, baseline movement, V paced w/QRS 176  Telemetry:  Telemetry was personally reviewed and demonstrates:   AV paced    Relevant CV Studies:  04/17/2022: TTE Left Ventricle: Systolic function is severely abnormal. EF: 15-20%.    Left Ventricle: Left ventricle was not well visualized. Left ventricle  is severely dilated.    Left Ventricle: Severe diffuse hypokinesis or akinesis is suggested.    The long axis parasternal images suggest normal motion of the base of the  septum and the base of the posterior wall.    Mitral Valve: The mitral valve was not well visualized.    Tricuspid Valve: The tricuspid valve was not well visualized.    Tricuspid Valve: Unable to assess RVSP due to incomplete Doppler  signal.  Laboratory Data:  High Sensitivity Troponin:   Recent Labs  Lab 08/02/22 2011 08/03/22 1050  TROPONINIHS 43* 49*     Chemistry Recent Labs  Lab 08/02/22 2011 08/03/22 0452 08/03/22 1050 08/04/22 0435  NA 135 136  --  135  K 5.0 4.9  --  4.3  CL 106 108  --  108  CO2 22 20*  --  20*  GLUCOSE 189* 164*  --  134*  BUN 36* 39*  --  37*  CREATININE 2.60* 2.63*  --  2.15*  CALCIUM 8.8*  8.3*  --  7.8*  MG  --   --  2.1 2.1  GFRNONAA 24* 24*  --  30*  ANIONGAP 7 8  --  7    Recent Labs  Lab 08/02/22 2011 08/03/22 0452 08/04/22 0435  PROT 6.0* 5.5* 5.3*  ALBUMIN 3.3* 3.1* 2.9*  AST 958* 1,568* 1,288*  ALT 875* 1,348* 1,450*  ALKPHOS 59 51 51  BILITOT 0.9 1.1 1.0   Lipids No results for input(s): "CHOL", "TRIG", "HDL", "LABVLDL", "LDLCALC", "CHOLHDL" in the last 168 hours.  Hematology Recent Labs  Lab 08/02/22 2011 08/04/22 0435  WBC 7.1 3.9*  RBC 4.29 4.51  HGB 13.1 13.8  HCT 40.4 40.8  MCV 94.2 90.5  MCH 30.5 30.6  MCHC 32.4 33.8  RDW 14.9 14.7  PLT 103* 88*   Thyroid  Recent Labs  Lab 08/03/22 0930  TSH 0.812    BNP Recent Labs  Lab 08/03/22 0842  BNP 288.6*    DDimer  Recent Labs  Lab 08/03/22 1050  DDIMER 3.44*     Radiology/Studies:  US Abdomen Limited Result Date: 08/03/2022 CLINICAL DATA:  Abnormal liver function tests EXAM: ULTRASOUND ABDOMEN LIMITED RIGHT UPPER QUADRANT COMPARISON:  None Available. FINDINGS: Gallbladder: There is a 1.6 cm echogenic structure with acoustic shadowing in the neck of the gallbladder suggesting gallbladder stone. There is no fluid around the gallbladder. Technologist did not observe any tenderness over the gallbladder during the study. Common bile duct: Diameter: 5.5 mm Liver: There is mild nodularity of liver surface. There is slightly increased echogenicity in the liver. No focal abnormalities are seen in visualized portions of liver. Portal vein is patent on color Doppler imaging with normal direction of blood flow towards the liver. Other: None. IMPRESSION: Gallbladder stone. There are no sonographic signs of acute cholecystitis. Increased echogenicity liver suggests fatty infiltration. There is mild nodularity of liver surface which may be a normal variation are suggested cirrhosis. Electronically Signed   By: Elmer Picker M.D.   On: 08/03/2022 12:39   CT Cervical Spine Wo Contrast Result Date:  08/02/2022 CLINICAL DATA:  Trauma. EXAM: CT CERVICAL SPINE WITHOUT CONTRAST TECHNIQUE: Multidetector CT imaging of the cervical spine was performed without intravenous contrast. Multiplanar CT image reconstructions were also generated. RADIATION DOSE REDUCTION: This exam was performed according to the departmental dose-optimization program which includes automated exposure control, adjustment of the mA and/or kV according to patient size and/or use of iterative reconstruction technique. COMPARISON:  None Available. FINDINGS: Alignment: Normal. Skull base and vertebrae: No acute fracture. No primary bone lesion or focal pathologic process. Soft tissues and spinal canal: No prevertebral fluid or swelling. No visible canal hematoma. Disc levels: No significant central canal or neural foraminal stenosis at any level. Upper chest: Negative. Other: None. IMPRESSION: No acute fracture or traumatic subluxation of the cervical spine. Electronically Signed   By: Ronney Asters M.D.   On: 08/02/2022 21:00   CT Head Wo Contrast Result Date: 08/02/2022 CLINICAL DATA:  81 year old male with fall and head injury yesterday. Headache. EXAM: CT HEAD WITHOUT CONTRAST TECHNIQUE: Contiguous axial images were obtained from the base of the skull through the vertex without intravenous contrast. RADIATION DOSE REDUCTION: This exam was performed according to the departmental dose-optimization program which includes automated exposure control, adjustment of the mA and/or kV according to patient  size and/or use of iterative reconstruction technique. COMPARISON:  None Available. FINDINGS: Brain: No evidence of acute infarction, hemorrhage, hydrocephalus, extra-axial collection or mass lesion/mass effect. Atrophy identified. Vascular: Carotid and vertebral atherosclerotic calcifications are noted. Skull: Normal. Negative for fracture or focal lesion. Sinuses/Orbits: Mucosal thickening and mucous retention cyst within the maxillary sinuses  noted. No acute abnormalities. Other: None. IMPRESSION: 1. No evidence of acute intracranial abnormality. 2. Atrophy. Electronically Signed   By: Margarette Canada M.D.   On: 08/02/2022 21:00   DG Chest 1 View Result Date: 08/02/2022 CLINICAL DATA:  Fall and dizziness. EXAM: CHEST  1 VIEW COMPARISON:  None Available. FINDINGS: Cardiomegaly and RIGHT-sided AICD/pacemaker noted. LEFT AICD/pacemaker leads noted without generator pack. There is no evidence of focal airspace disease, pulmonary edema, suspicious pulmonary nodule/mass, pleural effusion, or pneumothorax. No acute bony abnormalities are identified. IMPRESSION: No active disease. Electronically Signed   By: Margarette Canada M.D.   On: 08/02/2022 20:38     Assessment and Plan:   ICM Chronic CHF (combined) VT  He is here with COVID 06/29/22 his LFTs were wnl He arrived with elevated LFT that climbed, now trending down slightly  looks like he has been on Amiodarone since at least May if this year Will take weeks to months to clear, he has had some slight improvement   Industry has interrogated the device with reported normal device function and no therapies since May  GI seems to favor amio as the culprit, less likely the cefdinir, +/- COVID illness  Dr. Quentin Ore has seen the patient Agree with stopping his amiodarone Have him follow up with his cardiology/EP team after discharge to discuss alternative AAD/rhythm management strategies    Risk Assessment/Risk Scores:    For questions or updates, please contact [place] Please consult [email or web address] for contact info under    Signed, Baldwin [place], PA-C  08/04/2022 1:50 PM

## 2022-08-04 NOTE — Evaluation (Signed)
Physical Therapy Evaluation Patient Details Name: [name] MRN: [id number] DOB: 02/27/1941 Today's Date: 08/04/2022  History of Present Illness  Pt is a 81 yr old male who was admitted on 08/02/22 due to a falls and dizziness. COVID +. CT negative of fxs. PMH: HTN, HLD, HFrEF(15-20%), CHB s/p AICD/PM, CAD, DM type II, CKD stage 3B, thrombocytopenia, GERD, and obesity  Clinical Impression   Pt presents with generalized weakness, impaired balance most notable during directional changes, and decreased activity tolerance. Pt to benefit from acute PT to address deficits. Pt ambulated room distance only with x1 period min A to recover unsteadiness, pt fatigued after room distance gait and needing rest. PT anticipates pt will progress well with mobility while acute, HHPT currently recommended. PT to progress mobility as tolerated, and will continue to follow acutely.         Recommendations for follow up therapy are one component of a multi-disciplinary discharge planning process, led by the attending physician.  Recommendations may be updated based on patient status, additional functional criteria and insurance authorization.  Follow Up Recommendations Home health PT (hope to progress pt to OP level for strengthening, balance)      Assistance Recommended at Discharge Intermittent Supervision/Assistance  Patient can return home with the following  A little help with walking and/or transfers    Equipment Recommendations None recommended by PT  Recommendations for Other Services       Functional Status Assessment Patient has had a recent decline in their functional status and demonstrates the ability to make significant improvements in function in a reasonable and predictable amount of time.     Precautions / Restrictions Precautions Precautions: Fall Precaution Comments: reported 4 falls leading up to ED visit Restrictions Weight Bearing Restrictions: No      Mobility  Bed  Mobility               General bed mobility comments: up in chair    Transfers Overall transfer level: Needs assistance Equipment used: None Transfers: Sit to/from Stand Sit to Stand: Min guard           General transfer comment: safety    Ambulation/Gait Ambulation/Gait assistance: Min guard, Min assist Gait Distance (Feet): 25 Feet Assistive device: None Gait Pattern/deviations: Step-through pattern, Decreased stride length, Drifts right/left Gait velocity: decr     General Gait Details: close guard for safety, x1 period of unsteadiness requiring PT assist to correct  Stairs            Wheelchair Mobility    Modified Rankin (Stroke Patients Only)       Balance Overall balance assessment: Needs assistance, History of Falls Sitting-balance support: No upper extremity supported, Feet unsupported Sitting balance-Leahy Scale: Good     Standing balance support: No upper extremity supported, During functional activity Standing balance-Leahy Scale: Fair                               Pertinent Vitals/Pain Pain Assessment Pain Assessment: No/denies pain    Home Living Family/patient expects to be discharged to:: Private residence Living Arrangements: Spouse/significant other Available Help at Discharge: Family Type of Home: House Home Access: Stairs to enter Entrance Stairs-Rails: Can reach both;Left;Right Entrance Stairs-Number of Steps: 4   Home Layout: One level Home Equipment: Shower seat - built in;Grab bars - tub/shower Additional Comments: cane but it is wife's    Prior Function Prior Level of Function : Independent/Modified  Independent             Mobility Comments: Pt reported they enjoy working in the yard. Recent history of multiple falls       Hand Dominance   Dominant Hand: Right    Extremity/Trunk Assessment   Upper Extremity Assessment Upper Extremity Assessment: Defer to OT evaluation    Lower  Extremity Assessment Lower Extremity Assessment: Generalized weakness (full AROM)    Cervical / Trunk Assessment Cervical / Trunk Assessment: Normal  Communication   Communication: No difficulties  Cognition Arousal/Alertness: Awake/alert Behavior During Therapy: WFL for tasks assessed/performed Overall Cognitive Status: Within Functional Limits for tasks assessed                                          General Comments General comments (skin integrity, edema, etc.): dry cough noted during session    Exercises     Assessment/Plan    PT Assessment Patient needs continued PT services  PT Problem List Decreased strength;Decreased mobility;Decreased safety awareness;Decreased balance;Decreased knowledge of use of DME;Decreased activity tolerance;Cardiopulmonary status limiting activity       PT Treatment Interventions DME instruction;Therapeutic activities;Gait training;Patient/family education;Therapeutic exercise;Stair training;Functional mobility training;Balance training    PT Goals (Current goals can be found in the Care Plan section)  Acute Rehab PT Goals Patient Stated Goal: home PT Goal Formulation: With patient Time For Goal Achievement: 08/18/22 Potential to Achieve Goals: Good    Frequency Min 3X/week     Co-evaluation               AM-PAC PT "6 Clicks" Mobility  Outcome Measure Help needed turning from your back to your side while in a flat bed without using bedrails?: A Little Help needed moving from lying on your back to sitting on the side of a flat bed without using bedrails?: A Little Help needed moving to and from a bed to a chair (including a wheelchair)?: A Little Help needed standing up from a chair using your arms (e.g., wheelchair or bedside chair)?: A Little Help needed to walk in hospital room?: A Little Help needed climbing 3-5 steps with a railing? : A Little 6 Click Score: 18    End of Session   Activity Tolerance:  Patient tolerated treatment well;Patient limited by fatigue Patient left: in chair;with call bell/phone within reach;with chair alarm set Nurse Communication: Mobility status PT Visit Diagnosis: Other abnormalities of gait and mobility (R26.89)    Time: 5366-4403 PT Time Calculation (min) (ACUTE ONLY): 14 min   Charges:   PT Evaluation $PT Eval Low Complexity: 1 Low         Aoife Bold S, PT DPT Acute Rehabilitation Services Pager [phone]  Office [phone]   Roxine Caddy E Ruffin Pyo 08/04/2022, 11:44 AM

## 2022-08-04 NOTE — Evaluation (Signed)
Occupational Therapy Evaluation Patient Details Name: [name] MRN: [id number] DOB: [birth year] Today's Date: 08/04/2022   History of Present Illness Pt is a 81 yr old male who was admitted on 08/02/22 due to a falls and dizziness. COVID +. CT negative of fxs. PMH: HTN, HLD, HFrEF(15-20%), CHB s/p AICD/PM, CAD, DM type II, CKD stage 3B, thrombocytopenia, GERD, and obesity   Clinical Impression   Pt at PLOF reported they are generally active and does not use any AE. Pt was able to complete ADLS in standing with min guard with ist to stand transfers. It was noted when standing with activities they had a slight postural sway but no LOB. Pt was advised at this time to complete LE ADLS while sitting to decrease in risk of falls with the return to home. Pt currently with functional limitations due to the deficits listed below (see OT Problem List).  Pt will benefit from skilled OT to increase their safety and independence with ADL and functional mobility for ADL to facilitate discharge to venue listed below.        Recommendations for follow up therapy are one component of a multi-disciplinary discharge planning process, led by the attending physician.  Recommendations may be updated based on patient status, additional functional criteria and insurance authorization.   Follow Up Recommendations  No OT follow up    Assistance Recommended at Discharge Intermittent Supervision/Assistance  Patient can return home with the following Assistance with cooking/housework    Functional Status Assessment  Patient has had a recent decline in their functional status and demonstrates the ability to make significant improvements in function in a reasonable and predictable amount of time.  Equipment Recommendations  None recommended by OT    Recommendations for Other Services       Precautions / Restrictions Precautions Precautions: Fall Precaution Comments: reported 4 falls leading up to ED  visit Restrictions Weight Bearing Restrictions: No      Mobility Bed Mobility Overal bed mobility: Modified Independent                  Transfers Overall transfer level: Needs assistance Equipment used: 1 person hand held assist (IV pole) Transfers: Sit to/from Stand Sit to Stand: Supervision                  Balance Overall balance assessment: Mild deficits observed, not formally tested                                         ADL either performed or assessed with clinical judgement   ADL Overall ADL's : Needs assistance/impaired Eating/Feeding: Independent;Sitting   Grooming: Wash/dry hands;Wash/dry face;Min guard;Standing   Upper Body Bathing: Min guard;Standing   Lower Body Bathing: Sit to/from stand;Min guard   Upper Body Dressing : Min guard;Standing   Lower Body Dressing: Min guard;Cueing for safety;Cueing for sequencing;Sit to/from stand   Toilet Transfer: Agricultural engineer (2 wheels)   Toileting- Water quality scientist and Hygiene: Min guard;Sit to/from stand       Functional mobility during ADLs: Min guard (IV pole)       Vision Baseline Vision/History: 1 Wears glasses Ability to See in Adequate Light: 0 Adequate Patient Visual Report: No change from baseline Vision Assessment?: No apparent visual deficits     Perception     Praxis      Pertinent Vitals/Pain Pain Assessment Pain Assessment:  No/denies pain     Hand Dominance Right   Extremity/Trunk Assessment Upper Extremity Assessment Upper Extremity Assessment: Overall WFL for tasks assessed   Lower Extremity Assessment Lower Extremity Assessment: Defer to PT evaluation   Cervical / Trunk Assessment Cervical / Trunk Assessment: Normal   Communication Communication Communication: No difficulties   Cognition Arousal/Alertness: Awake/alert Behavior During Therapy: WFL for tasks assessed/performed Overall Cognitive Status: Within Functional  Limits for tasks assessed                                       General Comments       Exercises     Shoulder Instructions      Home Living Family/patient expects to be discharged to:: Private residence Living Arrangements: Spouse/significant other Available Help at Discharge: Family Type of Home: House Home Access: Stairs to enter Technical brewer of Steps: 4 Entrance Stairs-Rails: Can reach both;Left;Right Home Layout: One level     Bathroom Shower/Tub: Occupational psychologist: Standard     Home Equipment: Shower seat - built in;Grab bars - tub/shower   Additional Comments: cane but it is wife's      Prior Functioning/Environment Prior Level of Function : Independent/Modified Independent             Mobility Comments: Pt reported they enjoy working in the yard          OT Problem List: Decreased strength;Decreased activity tolerance;Decreased knowledge of use of DME or AE;Cardiopulmonary status limiting activity      OT Treatment/Interventions: Self-care/ADL training;DME and/or AE instruction;Therapeutic activities;Balance training;Patient/family education    OT Goals(Current goals can be found in the care plan section) Acute Rehab OT Goals Patient Stated Goal: to get back to doing PLOF OT Goal Formulation: With patient Time For Goal Achievement: 08/18/22 Potential to Achieve Goals: Good ADL Goals Pt Will Perform Lower Body Bathing: with modified independence;sit to/from stand Pt Will Perform Lower Body Dressing: with modified independence;sit to/from stand Pt Will Perform Tub/Shower Transfer: with modified independence;shower seat;ambulating Additional ADL Goal #1: Pt will tolerate 15 mins of activity without LOB to decrease in fall risk with the return to home  OT Frequency: Min 2X/week    Co-evaluation              AM-PAC OT "6 Clicks" Daily Activity     Outcome Measure Help from another person eating meals?:  None Help from another person taking care of personal grooming?: None Help from another person toileting, which includes using toliet, bedpan, or urinal?: A Little Help from another person bathing (including washing, rinsing, drying)?: A Little Help from another person to put on and taking off regular upper body clothing?: None Help from another person to put on and taking off regular lower body clothing?: A Little 6 Click Score: 21   End of Session Equipment Utilized During Treatment: Gait belt  Activity Tolerance: Patient tolerated treatment well Patient left: in chair;with call bell/phone within reach;with chair alarm set  OT Visit Diagnosis: Unsteadiness on feet (R26.81);Other abnormalities of gait and mobility (R26.89);Repeated falls (R29.6)                Time: 6269-4854 OT Time Calculation (min): 30 min Charges:  OT General Charges $OT Visit: 1 Visit OT Evaluation $OT Eval Low Complexity: 1 Low OT Treatments $Self Care/Home Management : 8-22 mins  Joeseph Amor OTR/L  Acute Rehab Services  [phone] office number [phone] pager number   Joeseph Amor 08/04/2022, 9:05 AM

## 2022-08-04 NOTE — Progress Notes (Signed)
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  Presented to the hospital with complaints of fall and dizziness.  Found to have acute drug-induced liver injury with LFTs in 1000 and incidental COVID  Assessment and Plan: COVID-19 virus infection Appears to be incidental. Currently no oxygen needs.  Currently no crackles on exam.  No wheezing. Started on molnupiravir.  Continue to monitor for now.  Acute drug-induced liver injury. AST 1568, ALT 1348 on admission.  Bilirubin normal.  INR 1.3. Ultrasound abdomen shows gallstones but no other acute abnormality. GI was consulted. No concern for alcohol or acetaminophen use. Suspect this is mostly driven by amiodarone. Monitor INR. Work-up including acute hepatitis serology, ANA, IgG and ceruloplasmin normal. Autoimmune work-up currently pending although suspect that will be normal as well.  Ischemic cardiomyopathy AICD implant History of V. tach storm. Patient was started on amiodarone in May after admission for 2 AICD firing. 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 Suspect his LFT elevation is mostly secondary to amiodarone toxicity. Cardiology was consulted as the patient currently is not on any medication to suppress the V. tach's. We will follow recommendation.  CAD Chronic combined systolic and diastolic CHF. HTN. Blood pressure soft.  Currently does not have any chest pain or chest tightness. Sees [facility] cardiology. Was on Entresto and Aldactone currently on hold. Was also on beta-blocker currently on hold due to hypotension. Monitor.  Recurrent fall. PT OT consulted.  Home health PT recommended. No focal deficit at the time of my evaluation. CT head and C-spine  unremarkable. Etiology of the recurrent fall is not clear.  Acute kidney injury on CKD 3B. Baseline serum creatinine around 1.6.  On admission serum creatinine 2.6. CK normal. Entresto on hold Aldactone on hold.  Monitor  Complete heart block. S/P AICD/pacemaker implant.  Chronic thrombocytopenia with acute worsening. Platelet count chronically low.  Now trending down to 88. Currently holding DVT prophylaxis and reducing aspirin from full dose to 81 mg.  Type 2 diabetes mellitus, uncontrolled with hyperglycemia with long-term long acting insulin use with CKD. Continue sliding scale insulin. Reduce the dose of his long-acting insulin. Monitor.  HLD. Statin currently on hold due to elevated LFTs.  GERD. Continue PPI.  Obesity Body mass index is 32.53 kg/m.  Placing the pt at higher risk of poor outcomes.  Subjective: No nausea no vomiting no fever no chills.  No chest pain.  No abdominal pain.  Physical Exam: Vitals:   08/03/22 2106 08/04/22 0700 08/04/22 0954 08/04/22 1629  BP: 120/74 109/66 115/66 110/65  Pulse: (!) 57 60 (!) 59 (!) 59  Resp: '18 18 18 17  '$ Temp: 99.4 F (37.4 C) 98.9 F (37.2 C) 98 F (36.7 C)   TempSrc:  Oral Oral   SpO2: 96% 94% 95% 96%  Weight:      Height:       General: Appear in mild distress; no visible Abnormal Neck Mass Or lumps, Conjunctiva normal Cardiovascular: S1 and S2 Present, NO Murmur, Respiratory: good respiratory effort, Bilateral Air entry present and CTA, NO Crackles, NO wheezes Abdomen: Bowel Sound present, Non tender  Extremities: no Pedal edema Neurology: alert and oriented to time, place, and person  Gait not checked due to patient safety concerns   Data  Reviewed: I have Reviewed nursing notes, Vitals, and Lab results since pt's last encounter. Pertinent lab results CBC and BMP I have ordered test including CBC and CMP I have discussed pt's care plan and test results with GI and cardiology.   Family Communication:  No one at bedside  Disposition: Status is: Inpatient Remains inpatient appropriate because: Need for stabilization of LFTs and final cardiology recommendation.  Author: Berle Mull, MD 08/04/2022 7:26 PM  Please look on [email or web address] to find out who is on call.

## 2022-08-04 NOTE — Progress Notes (Addendum)
Daily Rounding Note  08/04/2022, 10:20 AM  LOS: 1 day   SUBJECTIVE:   Chief complaint:      Normotensive.  HR readings upper 50s to low 60s.  No fever.  RA sats mid 90s.    Feeling well.  No complaints.  No dyspnea, no cough.  Appetitie still reduced but tolerating PO just fiine.    OBJECTIVE:         Vital signs in last 24 hours:    Temp:  [98 F (36.7 C)-99.4 F (37.4 C)] 98 F (36.7 C) (08/29 0954) Pulse Rate:  [57-78] 59 (08/29 0954) Resp:  [11-21] 18 (08/29 0954) BP: (109-129)/(55-75) 115/66 (08/29 0954) SpO2:  [92 %-96 %] 95 % (08/29 0954)   Filed Weights   08/02/22 2003  Weight: 108.8 kg   General: aged, obese, NAD.  Looks somewhat chronically ill   Heart: RRR Chest: clear bil.  No cough or dyspnea Abdomen: soft, obese, NT, ND.  BS active  Extremities: LE swelling w minimal pitting Neuro/Psych:  oriented x 3. No tremors, no gross deficits.  Fluid speech.    Intake/Output from previous day: 08/28 0701 - 08/29 0700 In: -  Out: 9811 [Urine:1620]  Intake/Output this shift: No intake/output data recorded.  Lab Results: Recent Labs    08/02/22 2011 08/04/22 0435  WBC 7.1 3.9*  HGB 13.1 13.8  HCT 40.4 40.8  PLT 103* 88*   BMET Recent Labs    08/02/22 2011 08/03/22 0452 08/04/22 0435  NA 135 136 135  K 5.0 4.9 4.3  CL 106 108 108  CO2 22 20* 20*  GLUCOSE 189* 164* 134*  BUN 36* 39* 37*  CREATININE 2.60* 2.63* 2.15*  CALCIUM 8.8* 8.3* 7.8*   LFT Recent Labs    08/02/22 2011 08/03/22 0452 08/04/22 0435  PROT 6.0* 5.5* 5.3*  ALBUMIN 3.3* 3.1* 2.9*  AST 958* 1,568* 1,288*  ALT 875* 1,348* 1,450*  ALKPHOS 59 51 51  BILITOT 0.9 1.1 1.0   PT/INR Recent Labs    08/03/22 1050  LABPROT 16.0*  INR 1.3*   Hepatitis Panel Recent Labs    08/03/22 0842  HEPBSAG NON REACTIVE  NON REACTIVE  HCVAB NON REACTIVE  HEPAIGM NON REACTIVE  HEPBIGM NON REACTIVE    Studies/Results: US  Abdomen Limited  Result Date: 08/03/2022 CLINICAL DATA:  Abnormal liver function tests EXAM: ULTRASOUND ABDOMEN LIMITED RIGHT UPPER QUADRANT COMPARISON:  None Available. FINDINGS: Gallbladder: There is a 1.6 cm echogenic structure with acoustic shadowing in the neck of the gallbladder suggesting gallbladder stone. There is no fluid around the gallbladder. Technologist did not observe any tenderness over the gallbladder during the study. Common bile duct: Diameter: 5.5 mm Liver: There is mild nodularity of liver surface. There is slightly increased echogenicity in the liver. No focal abnormalities are seen in visualized portions of liver. Portal vein is patent on color Doppler imaging with normal direction of blood flow towards the liver. Other: None. IMPRESSION: Gallbladder stone. There are no sonographic signs of acute cholecystitis. Increased echogenicity liver suggests fatty infiltration. There is mild nodularity of liver surface which may be a normal variation are suggested cirrhosis. Electronically Signed   By: Elmer Picker M.D.   On: 08/03/2022 12:39   CT Cervical Spine Wo Contrast  Result Date: 08/02/2022 CLINICAL DATA:  Trauma. EXAM: CT CERVICAL SPINE WITHOUT CONTRAST TECHNIQUE: Multidetector CT imaging of the cervical spine was performed without intravenous contrast. Multiplanar CT image reconstructions  were also generated. RADIATION DOSE REDUCTION: This exam was performed according to the departmental dose-optimization program which includes automated exposure control, adjustment of the mA and/or kV according to patient size and/or use of iterative reconstruction technique. COMPARISON:  None Available. FINDINGS: Alignment: Normal. Skull base and vertebrae: No acute fracture. No primary bone lesion or focal pathologic process. Soft tissues and spinal canal: No prevertebral fluid or swelling. No visible canal hematoma. Disc levels: No significant central canal or neural foraminal stenosis at any  level. Upper chest: Negative. Other: None. IMPRESSION: No acute fracture or traumatic subluxation of the cervical spine. Electronically Signed   By: Ronney Asters M.D.   On: 08/02/2022 21:00   CT Head Wo Contrast  Result Date: 08/02/2022 CLINICAL DATA:  81 year old male with fall and head injury yesterday. Headache. EXAM: CT HEAD WITHOUT CONTRAST TECHNIQUE: Contiguous axial images were obtained from the base of the skull through the vertex without intravenous contrast. RADIATION DOSE REDUCTION: This exam was performed according to the departmental dose-optimization program which includes automated exposure control, adjustment of the mA and/or kV according to patient size and/or use of iterative reconstruction technique. COMPARISON:  None Available. FINDINGS: Brain: No evidence of acute infarction, hemorrhage, hydrocephalus, extra-axial collection or mass lesion/mass effect. Atrophy identified. Vascular: Carotid and vertebral atherosclerotic calcifications are noted. Skull: Normal. Negative for fracture or focal lesion. Sinuses/Orbits: Mucosal thickening and mucous retention cyst within the maxillary sinuses noted. No acute abnormalities. Other: None. IMPRESSION: 1. No evidence of acute intracranial abnormality. 2. Atrophy. Electronically Signed   By: Margarette Canada M.D.   On: 08/02/2022 21:00   DG Chest 1 View  Result Date: 08/02/2022 CLINICAL DATA:  Fall and dizziness. EXAM: CHEST  1 VIEW COMPARISON:  None Available. FINDINGS: Cardiomegaly and RIGHT-sided AICD/pacemaker noted. LEFT AICD/pacemaker leads noted without generator pack. There is no evidence of focal airspace disease, pulmonary edema, suspicious pulmonary nodule/mass, pleural effusion, or pneumothorax. No acute bony abnormalities are identified. IMPRESSION: No active disease. Electronically Signed   By: Margarette Canada M.D.   On: 08/02/2022 20:38     Scheduled Meds:  vitamin C  500 mg Oral Daily   aspirin EC  81 mg Oral Daily   insulin aspart   0-15 Units Subcutaneous TID WC   insulin aspart  0-5 Units Subcutaneous QHS   insulin glargine-yfgn  25 Units Subcutaneous QHS   molnupiravir EUA  4 capsule Oral BID   pantoprazole  40 mg Oral Daily   sodium chloride flush  3 mL Intravenous Q12H   zinc sulfate  220 mg Oral Daily   Continuous Infusions: PRN Meds:.albuterol, azelastine, chlorpheniramine-HYDROcodone, fluticasone, guaiFENesin-dextromethorphan, ondansetron (ZOFRAN) IV  ASSESMENT:   Marked transaminitis occurring after previously normal LFTs of 06/29/2022.  Initiated on amiodarone in 06/2022 and 10-day course of cefdinir beginning 06/12/2022.   Ultrasound abdomen yesterday arms uncomplicated gallstone, changes of fatty liver and nodularity suggesting cirrhosis.   Acute hepatitis serologies are negative.  ANA, IgG,A1At, ceruloplasmin are all within normal limits/negative. Ferritin elevated 2485.  Pending labs include smooth muscle and mitochondrial Abs.   Transaminases improved but remains significantly elevated.  T. bili, alk phos consistently not elevated.  Chronic, longstanding, noncritical thrombocytopenia.  Platelets 88.  Chronic heart failure.  AKI.  Baseline CKD 3B per chart.  GFR better, 30 compared with 24 yesterday.    Elevated LDH of 1483.  Elevated CRP  4.1.      Mild coagulopathy.  INR 1.3.    Covid 19 positive.  CXR reassuring, no  pneumonia. Molnupiravir day 2.      IDDM.     PLAN     Amiodarone level pndg.  Repeat INR and LFTs tmrw AM    Cards consult pndg for eval of need for Amiodarone.      Azucena Freed  08/04/2022, 10:20 AM Phone [phone]   I have taken a history, reviewed the chart and examined the patient. I performed a substantive portion of this encounter, including complete performance of at least one of the key components, in conjunction with the APP. I agree with the APP's note, impression and recommendations  Clinically stable overnight.  Patient reports feeling well, no complaints.   Negative evaluation for causes of chronic liver disease thus far.  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  LAEs stable from yesterday. Suspicion remains for amiodarone-induced liver injury. Recheck INR tomorrow.  If stable and LAEs not uptrending, he may be appropriate for outpatient monitoring from a GI standpoint, assuming safe to d/c from cardiology standpoint off amiodarone.   Surya Folden E. Candis Schatz, MD [facility]

## 2022-08-05 ENCOUNTER — Other Ambulatory Visit: Payer: Self-pay | Admitting: Physician Assistant

## 2022-08-05 DIAGNOSIS — Z950 Presence of cardiac pacemaker: Secondary | ICD-10-CM | POA: Diagnosis not present

## 2022-08-05 DIAGNOSIS — U071 COVID-19: Secondary | ICD-10-CM | POA: Diagnosis not present

## 2022-08-05 DIAGNOSIS — I5020 Unspecified systolic (congestive) heart failure: Secondary | ICD-10-CM | POA: Diagnosis not present

## 2022-08-05 DIAGNOSIS — N179 Acute kidney failure, unspecified: Secondary | ICD-10-CM | POA: Diagnosis not present

## 2022-08-05 DIAGNOSIS — R7401 Elevation of levels of liver transaminase levels: Secondary | ICD-10-CM

## 2022-08-05 DIAGNOSIS — N1832 Chronic kidney disease, stage 3b: Secondary | ICD-10-CM

## 2022-08-05 LAB — COMPREHENSIVE METABOLIC PANEL
ALT: 962 U/L — ABNORMAL HIGH (ref 0–44)
AST: 452 U/L — ABNORMAL HIGH (ref 15–41)
Albumin: 2.9 g/dL — ABNORMAL LOW (ref 3.5–5.0)
Alkaline Phosphatase: 46 U/L (ref 38–126)
Anion gap: 10 (ref 5–15)
BUN: 28 mg/dL — ABNORMAL HIGH (ref 8–23)
CO2: 20 mmol/L — ABNORMAL LOW (ref 22–32)
Calcium: 8.4 mg/dL — ABNORMAL LOW (ref 8.9–10.3)
Chloride: 106 mmol/L (ref 98–111)
Creatinine, Ser: 1.88 mg/dL — ABNORMAL HIGH (ref 0.61–1.24)
GFR, Estimated: 35 mL/min — ABNORMAL LOW (ref >60)
Glucose, Bld: 122 mg/dL — ABNORMAL HIGH (ref 70–99)
Potassium: 4.8 mmol/L (ref 3.5–5.1)
Sodium: 136 mmol/L (ref 135–145)
Total Bilirubin: 0.9 mg/dL (ref 0.3–1.2)
Total Protein: 5.3 g/dL — ABNORMAL LOW (ref 6.5–8.1)

## 2022-08-05 LAB — CBC WITH DIFFERENTIAL/PLATELET
Abs Immature Granulocytes: 0.01 10*3/uL (ref 0.00–0.07)
Basophils Absolute: 0 10*3/uL (ref 0.0–0.1)
Basophils Relative: 1 %
Eosinophils Absolute: 0 10*3/uL (ref 0.0–0.5)
Eosinophils Relative: 0 %
HCT: 43.5 % (ref 39.0–52.0)
Hemoglobin: 14.6 g/dL (ref 13.0–17.0)
Immature Granulocytes: 0 %
Lymphocytes Relative: 19 %
Lymphs Abs: 0.8 10*3/uL (ref 0.7–4.0)
MCH: 30.5 pg (ref 26.0–34.0)
MCHC: 33.6 g/dL (ref 30.0–36.0)
MCV: 90.8 fL (ref 80.0–100.0)
Monocytes Absolute: 0.6 10*3/uL (ref 0.1–1.0)
Monocytes Relative: 14 %
Neutro Abs: 2.8 10*3/uL (ref 1.7–7.7)
Neutrophils Relative %: 66 %
Platelets: 85 10*3/uL — ABNORMAL LOW (ref 150–400)
RBC: 4.79 MIL/uL (ref 4.22–5.81)
RDW: 14.6 % (ref 11.5–15.5)
WBC: 4.2 10*3/uL (ref 4.0–10.5)
nRBC: 0 % (ref 0.0–0.2)

## 2022-08-05 LAB — GLUCOSE, CAPILLARY
Glucose-Capillary: 111 mg/dL — ABNORMAL HIGH (ref 70–99)
Glucose-Capillary: 147 mg/dL — ABNORMAL HIGH (ref 70–99)
Glucose-Capillary: 154 mg/dL — ABNORMAL HIGH (ref 70–99)

## 2022-08-05 LAB — PROTIME-INR
INR: 1.1 (ref 0.8–1.2)
Prothrombin Time: 13.8 seconds (ref 11.4–15.2)

## 2022-08-05 LAB — C-REACTIVE PROTEIN: CRP: 2.4 mg/dL — ABNORMAL HIGH (ref <1.0)

## 2022-08-05 LAB — MAGNESIUM: Magnesium: 2.1 mg/dL (ref 1.7–2.4)

## 2022-08-05 LAB — PHOSPHORUS: Phosphorus: 3.3 mg/dL (ref 2.5–4.6)

## 2022-08-05 MED ORDER — ASCORBIC ACID 500 MG PO TABS: 500.0000 mg | ORAL_TABLET | Freq: Every day | ORAL | Status: AC

## 2022-08-05 MED ORDER — ZINC SULFATE 220 (50 ZN) MG PO CAPS: 220.0000 mg | ORAL_CAPSULE | Freq: Every day | ORAL | Status: AC

## 2022-08-05 MED ORDER — MOLNUPIRAVIR EUA 200MG CAPSULE: 4.0000 | ORAL_CAPSULE | Freq: Two times a day (BID) | ORAL | 0 refills | Status: AC

## 2022-08-05 MED ORDER — ASPIRIN 81 MG PO TBEC: 81.0000 mg | DELAYED_RELEASE_TABLET | Freq: Every day | ORAL | 0 refills | Status: AC

## 2022-08-05 NOTE — Progress Notes (Addendum)
Daily Rounding Note  08/05/2022, 10:03 AM  LOS: 2 days   SUBJECTIVE:   Chief complaint:  Acutely elevated LFTs.      Feeling well.  No complaints.  Still has diminished appetite but no nausea or vomiting.  OBJECTIVE:         Vital signs in last 24 hours:    Temp:  [98.1 F (36.7 C)-98.9 F (37.2 C)] 98.9 F (37.2 C) (08/30 0940) Pulse Rate:  [57-59] 59 (08/30 0940) Resp:  [16-17] 17 (08/30 0940) BP: (107-115)/(62-65) 115/62 (08/30 0940) SpO2:  [96 %-100 %] 97 % (08/30 0940)   Filed Weights   08/02/22 2003  Weight: 108.8 kg   General: NAD.  Does not look ill. Heart: RRR. Chest: No labored breathing.  No cough. Abdomen: Not tender or distended.  Active bowel sounds. Extremities: No CCE. Neuro/Psych: No confusion or disorientation.  Pleasant.  Fluid speech.  No gross deficits.  Intake/Output from previous day: 08/29 0701 - 08/30 0700 In: 880 [P.O.:880] Out: 2000 [Urine:2000]  Intake/Output this shift: No intake/output data recorded.  Lab Results: Recent Labs    08/02/22 2011 08/04/22 0435 08/05/22 0800  WBC 7.1 3.9* 4.2  HGB 13.1 13.8 14.6  HCT 40.4 40.8 43.5  PLT 103* 88* 85*   BMET Recent Labs    08/03/22 0452 08/04/22 0435 08/05/22 0800  NA 136 135 136  K 4.9 4.3 4.8  CL 108 108 106  CO2 20* 20* 20*  GLUCOSE 164* 134* 122*  BUN 39* 37* 28*  CREATININE 2.63* 2.15* 1.88*  CALCIUM 8.3* 7.8* 8.4*   LFT Recent Labs    08/03/22 0452 08/04/22 0435 08/05/22 0800  PROT 5.5* 5.3* 5.3*  ALBUMIN 3.1* 2.9* 2.9*  AST 1,568* 1,288* 452*  ALT 1,348* 1,450* 962*  ALKPHOS 51 51 46  BILITOT 1.1 1.0 0.9   PT/INR Recent Labs    08/03/22 1050 08/05/22 0800  LABPROT 16.0* 13.8  INR 1.3* 1.1   Hepatitis Panel Recent Labs    08/03/22 0842  HEPBSAG NON REACTIVE  NON REACTIVE  HCVAB NON REACTIVE  HEPAIGM NON REACTIVE  HEPBIGM NON REACTIVE    Studies/Results: US Abdomen  Limited  Result Date: 08/03/2022 CLINICAL DATA:  Abnormal liver function tests EXAM: ULTRASOUND ABDOMEN LIMITED RIGHT UPPER QUADRANT COMPARISON:  None Available. FINDINGS: Gallbladder: There is a 1.6 cm echogenic structure with acoustic shadowing in the neck of the gallbladder suggesting gallbladder stone. There is no fluid around the gallbladder. Technologist did not observe any tenderness over the gallbladder during the study. Common bile duct: Diameter: 5.5 mm Liver: There is mild nodularity of liver surface. There is slightly increased echogenicity in the liver. No focal abnormalities are seen in visualized portions of liver. Portal vein is patent on color Doppler imaging with normal direction of blood flow towards the liver. Other: None. IMPRESSION: Gallbladder stone. There are no sonographic signs of acute cholecystitis. Increased echogenicity liver suggests fatty infiltration. There is mild nodularity of liver surface which may be a normal variation are suggested cirrhosis. Electronically Signed   By: Elmer Picker M.D.   On: 08/03/2022 12:39    Scheduled Meds:  vitamin C  500 mg Oral Daily   aspirin EC  81 mg Oral Daily   insulin aspart  0-15 Units Subcutaneous TID WC   insulin aspart  0-5 Units Subcutaneous QHS   insulin glargine-yfgn  25 Units Subcutaneous QHS   molnupiravir EUA  4 capsule Oral BID  pantoprazole  40 mg Oral Daily   sodium chloride flush  3 mL Intravenous Q12H   zinc sulfate  220 mg Oral Daily   Continuous Infusions: PRN Meds:.albuterol, azelastine, chlorpheniramine-HYDROcodone, fluticasone, guaiFENesin-dextromethorphan, ondansetron (ZOFRAN) IV  ASSESMENT:     Elevated LFTs.  Amiodarone since 04/2022 (held now), 10 d of Cefdenir early 7/23.  Suspicion for Amiodarone induced liver toxicity.  ? Contribution of Covid 19?Marland Kitchen  Abd [place] w simple gallstones, fatty liver and liver nodularity (cirrhosis?).   LFTs continue normalizing trend but still substantially elevated.    INR now normal: 1.3... 1.1. markers for AIH and Hep ABC serologies are unremarkable.          COVID-19 positive.  Few respiratory or flulike symptoms.  Day 3 Molnupiravir.    Ischemic cardiomyopathy, chronic CHF, V. Tach storm.  Previous ICD, pacemaker.  Cardiology, EP team has confirmed discontinuation of amiodarone.    Chronic thrombocytopenia.    PLAN    Dr Candis Schatz to follow but looks like pt is appropriate for dc home from liver/GI perspective.      [name]  08/05/2022, 10:03 AM Phone [phone]   I have taken a history, reviewed the chart and examined the patient. I performed a substantive portion of this encounter, including complete performance of at least one of the key components, in conjunction with the APP. I agree with the APP's note, impression and recommendations  Patient's liver enzymes improved today.  INR normal.  Patient continues to report feeling well and wonders when he can go home.  Etiology for acute liver enzyme elevation not entirely clear, but amiodarone-induced liver very possible.  If no other therapies reasonable for his cardiomyopathy, would recommend very close monitoring of his LAEs if amiodarone is restarted. 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  We can discuss elastography as an outpatient.  GI will sign off at this time.  Plan to recheck liver enzymes in 1 week as outpatient and follow up in our clinic.   Luccia Reinheimer E. Candis Schatz, MD [facility]astroenter[facility]

## 2022-08-05 NOTE — TOC Transition Note (Signed)
Transition of Care [facility]) - CM/SW Discharge Note   Patient Details  Name: [name] MRN: [id number] Date of Birth: 1941-05-21  [facility] CM/SW Contact:  Tom-Johnson, Renea Ee, RN Phone Number: 08/05/2022, 3:44 PM   Clinical Narrative:     Patient is scheduled for discharge today. Admitted for Covid. From home with wife who also has Covid infection. Has a walker and built-in shower seat at home.  PCP is Chesley Noon, MD and uses [facility] in [place] and also [facility]. Home health PT referral called in to [facility] per patient's wife's request. Info on AVS.  Son to transport at discharge. No further TOC needs noted.   Final next level of care: Home/Self Care Barriers to Discharge: Barriers Resolved   Patient Goals and CMS Choice Patient states their goals for this hospitalization and ongoing recovery are:: To return home CMS Medicare.gov Compare Post Acute Care list provided to:: Patient Choice offered to / list presented to : Patient, Spouse  Discharge Placement                Patient to be transferred to facility by: Son      Discharge Plan and Services                DME Arranged: N/A DME Agency: NA       HH Arranged: PT HH Agency: [facility] Date [place] Agency Contacted: 08/05/22 Time [place]: [id number] Representative spoke with at [place]: Delsa Sale  Social Determinants of Health ([place]) Interventions     Readmission Risk Interventions     No data to display

## 2022-08-05 NOTE — Discharge Instructions (Addendum)
[name],  You were in the hospital because of a fall and dizziness and were found to have evidence of low blood pressure, acute liver injury and COVID-19 infection. Your blood pressure/cardiac medications have been discontinued until your blood pressure improved; please follow-up with your cardiologist. Please call your cardiologist if you notice a 3-5 lb weight gain in 24-48 hours. Regarding your liver injury, the GI physician believes this may be related to your amiodarone, but it is possible it is related to your acute viral infection also. I recommend discontinuing your cholesterol medication until your liver enzyme numbers improve. Regarding your COVID-19 infection, please complete your molnupiravir prescription; please isolate for a total of 10 days (last day 08/12/2022).

## 2022-08-05 NOTE — Discharge Summary (Signed)
Physician Discharge Summary   Patient: [name] MRN: [id number] DOB: 23-Jul-1941  Admit date:     08/02/2022  Discharge date: 08/05/22  Discharge Physician: Cordelia Poche, MD   PCP: Chesley Noon, MD   Recommendations at discharge:  Follow-up with PCP, Gastroenterology and Cardiology Repeat CMP in 7 days Antihypertensives discontinued, watch clinically for ability to resume Vytorin discontinued secondary to elevated AST/ALT. Watch clinically for ability to resume Amiodarone discontinued secondary to concern for liver toxicity  Discharge Diagnoses: Principal Problem:   COVID-19 virus infection Active Problems:   Frequent falls   Transaminitis   Acute renal failure superimposed on stage 3b chronic kidney disease ([place])   Hypertension associated with diabetes ([place])   Heart failure with reduced ejection fraction (HCC)   Biventricular cardiac pacemaker in situ   Thrombocytopenia (HCC)   Hyperlipidemia associated with type 2 diabetes mellitus ([place])   GERD (gastroesophageal reflux disease)  Resolved Problems:   * No resolved hospital problems. *  Hospital Course: [name] is a 81 y.o. male with a history of CAD, ischemic cardiomyopathy with resultant chronic combined systolic and diastolic heart failure,, diabetes mellitus type 2, ventricular tachycardia status post ICD placement, severe mitral severe mitral regurgitation, complete heart block, hyperlipidemia, hyperlipidemia, CKD stage IIIb.  Patient presented.  Patient presented secondary to fall and dizziness with evidence of AKI, acute liver injury acute liver injury and incidental COVID-19 infection. GI consulted. Supportive care provided for acute liver injury and AKI. Patient started on molnupiravir for COVID-19 treatment. Amiodarone discontinued. Antihypertensives discontinued secondary to hypotension.  Assessment and Plan:  COVID-19 virus infection Incidental although patient does have some cough. No evidence of  pneumonia and no hypoxia. Patient started on molnupiravir x5 days for treatment  Acute liver injury Likely drug induced in setting of amiodarone use. Possibly contributed to by acute viral infection (COVID-19).  AST/ALT of 958/875 with peak of 1568/1348 respectively.  GI consulted. Supportive care while inpatient. Autoimmune workup negative. Infectious workup negative. Acute hepatitis panel is negative. Amiodarone discontinued. Patient to follow-up with GI as an outpatient.  AST/ALT of 452/962 respectively on day of discharge.  Ischemic cardiomyopathy Chronic combined systolic and diastolic heart failure Prior history of ventricular tachycardia storm.  Status post AICD implant.  Patient is on Entresto, Lasix, Imdur, spironolactone as an outpatient.  Medications held secondary to hypotension.  Medications will need to be readdressed as an outpatient.  CAD Hyperlipidemia Vytorin held secondary to acute liver injury.  Will need to restart pending movement of AST/ALT.  Primary pretension Antihypertensives held secondary to hypotension.  Patient was normotensive 1 day and discharged without antihypertensives.  Will need titration of antihypertensives as an outpatient.  Recurrent falls She was evaluated by PT/OT.  Recommendation for home health therapy.  Acute kidney injury CKD stage IIIb Baseline creatinine of around 1.6.  Creatinine on admission of 2.6.  Complicated by Delene Loll, Lasix, spironolactone use.  Creatinine improved with IV fluids.  Creatinine of 1.88 on day of discharge  Complete heart block Patient is status post AICD/pacemaker implant.  Chronic thrombocytopenia Stable.  Aspirin 325 mg decrease to aspirin 81 mg.  GERD Continue Protonix.   Consultants: [facility], Electrophysiology Procedures performed: None Disposition: Home health Diet recommendation: Cardiac diet   DISCHARGE MEDICATION: Allergies as of 08/05/2022   No Known Allergies      Medication List      STOP taking these medications    aspirin 325 MG tablet Replaced by: aspirin EC 81 MG tablet  Entresto 49-51 MG Generic drug: sacubitril-valsartan   ezetimibe-simvastatin 10-40 MG tablet Commonly known as: VYTORIN   furosemide 20 MG tablet Commonly known as: LASIX   isosorbide mononitrate 30 MG 24 hr tablet Commonly known as: IMDUR   Pacerone 200 MG tablet Generic drug: amiodarone   spironolactone 25 MG tablet Commonly known as: ALDACTONE       TAKE these medications    ascorbic acid 500 MG tablet Commonly known as: VITAMIN C Take 1 tablet (500 mg total) by mouth daily. Start taking on: August 06, 2022   aspirin EC 81 MG tablet Take 1 tablet (81 mg total) by mouth daily. Swallow whole. Start taking on: August 06, 2022 Replaces: aspirin 325 MG tablet   Azelastine HCl 0.15 % Soln Place 1 spray into both nostrils daily. What changed:  when to take this reasons to take this   carvedilol 12.5 MG tablet Commonly known as: COREG Take 12.5 mg by mouth 2 (two) times daily.   fluticasone 50 MCG/ACT nasal spray Commonly known as: FLONASE Place 1 spray into both nostrils 2 (two) times daily as needed for allergies or rhinitis.   freestyle lancets USE AS INSTRUCTED   FREESTYLE LITE test strip Generic drug: glucose blood USE AS INSTRUCTED   Jardiance 25 MG Tabs tablet Generic drug: empagliflozin Take 12.5 mg by mouth daily.   Lantus SoloStar 100 UNIT/ML Solostar Pen Generic drug: insulin glargine INJECT 59-60 UNITS UNDER THE SKIN DAILY or as directed What changed:  how much to take how to take this when to take this additional instructions   molnupiravir EUA 200 mg Caps capsule Commonly known as: LAGEVRIO Take 4 capsules (800 mg total) by mouth 2 (two) times daily. Complete bottle given to you on discharge.   nitroGLYCERIN 0.4 MG SL tablet Commonly known as: NITROSTAT Place 0.4 mg under the tongue every 5 (five) minutes as needed for chest pain.    NovoLOG FlexPen 100 UNIT/ML FlexPen Generic drug: insulin aspart INJECT 30 UNITS UNDER THE SKIN THREE TIMES A DAY WITH MEALS What changed:  how much to take how to take this when to take this additional instructions   pantoprazole 40 MG tablet Commonly known as: PROTONIX Take 1 tablet (40 mg total) by mouth 2 (two) times daily. What changed: when to take this   Sure Comfort Pen Needles 31G X 5 MM Misc Generic drug: Insulin Pen Needle USE 1 PEN NEEDLE TO INJECT UNDER THE SKIN THREE TIMES A DAY   zinc sulfate 220 (50 Zn) MG capsule Take 1 capsule (220 mg total) by mouth daily. Start taking on: August 06, 2022        Follow-up Information     Daryel November, MD Follow up.   Specialty: Gastroenterology Why: this is MD for liver follow up.  office will contact you with appointment. Contact information: [place] [address] [phone]         [facility] Follow up.   Why: lab work.  please go to lab in basement of building on Wed Sept 6th for blood draw.  They have the orders.  Open 830 to 5 PM. Contact information: [place] [phone]        Chesley Noon, MD. Schedule an appointment as soon as possible for a visit in 1 week(s).   Specialty: Family Medicine Why: For hospital follow-up Contact information: [address] [place] [address] [phone]  Discharge Exam: BP 115/62 (BP Location: Right Arm)   Pulse (!) 59   Temp 98.9 F (37.2 C) (Oral)   Resp 17   Ht 6' (1.829 m)   Wt 108.8 kg   SpO2 97%   BMI 32.53 kg/m   General exam: Appears calm and comfortable Respiratory system: Clear to auscultation. Respiratory effort normal. Cardiovascular system: S1 & S2 heard, irregular rhythm with normal rate. Gastrointestinal system: Abdomen is soft and non-tender. Normal bowel sounds heard. Central nervous system: Alert and oriented. No focal  neurological deficits. Musculoskeletal: No edema. No calf tenderness Skin: No cyanosis. No rashes Psychiatry: Judgement and insight appear normal. Mood & affect appropriate.   Condition at discharge: stable  The results of significant diagnostics from this hospitalization (including imaging, microbiology, ancillary and laboratory) are listed below for reference.   Imaging Studies: US Abdomen Limited  Result Date: 08/03/2022 CLINICAL DATA:  Abnormal liver function tests EXAM: ULTRASOUND ABDOMEN LIMITED RIGHT UPPER QUADRANT COMPARISON:  None Available. FINDINGS: Gallbladder: There is a 1.6 cm echogenic structure with acoustic shadowing in the neck of the gallbladder suggesting gallbladder stone. There is no fluid around the gallbladder. Technologist did not observe any tenderness over the gallbladder during the study. Common bile duct: Diameter: 5.5 mm Liver: There is mild nodularity of liver surface. There is slightly increased echogenicity in the liver. No focal abnormalities are seen in visualized portions of liver. Portal vein is patent on color Doppler imaging with normal direction of blood flow towards the liver. Other: None. IMPRESSION: Gallbladder stone. There are no sonographic signs of acute cholecystitis. Increased echogenicity liver suggests fatty infiltration. There is mild nodularity of liver surface which may be a normal variation are suggested cirrhosis. Electronically Signed   By: Elmer Picker M.D.   On: 08/03/2022 12:39   CT Cervical Spine Wo Contrast  Result Date: 08/02/2022 CLINICAL DATA:  Trauma. EXAM: CT CERVICAL SPINE WITHOUT CONTRAST TECHNIQUE: Multidetector CT imaging of the cervical spine was performed without intravenous contrast. Multiplanar CT image reconstructions were also generated. RADIATION DOSE REDUCTION: This exam was performed according to the departmental dose-optimization program which includes automated exposure control, adjustment of the mA and/or kV  according to patient size and/or use of iterative reconstruction technique. COMPARISON:  None Available. FINDINGS: Alignment: Normal. Skull base and vertebrae: No acute fracture. No primary bone lesion or focal pathologic process. Soft tissues and spinal canal: No prevertebral fluid or swelling. No visible canal hematoma. Disc levels: No significant central canal or neural foraminal stenosis at any level. Upper chest: Negative. Other: None. IMPRESSION: No acute fracture or traumatic subluxation of the cervical spine. Electronically Signed   By: Ronney Asters M.D.   On: 08/02/2022 21:00   CT Head Wo Contrast  Result Date: 08/02/2022 CLINICAL DATA:  81 year old male with fall and head injury yesterday. Headache. EXAM: CT HEAD WITHOUT CONTRAST TECHNIQUE: Contiguous axial images were obtained from the base of the skull through the vertex without intravenous contrast. RADIATION DOSE REDUCTION: This exam was performed according to the departmental dose-optimization program which includes automated exposure control, adjustment of the mA and/or kV according to patient size and/or use of iterative reconstruction technique. COMPARISON:  None Available. FINDINGS: Brain: No evidence of acute infarction, hemorrhage, hydrocephalus, extra-axial collection or mass lesion/mass effect. Atrophy identified. Vascular: Carotid and vertebral atherosclerotic calcifications are noted. Skull: Normal. Negative for fracture or focal lesion. Sinuses/Orbits: Mucosal thickening and mucous retention cyst within the maxillary sinuses noted. No acute abnormalities. Other: None. IMPRESSION: 1. No evidence  of acute intracranial abnormality. 2. Atrophy. Electronically Signed   By: Margarette Canada M.D.   On: 08/02/2022 21:00   DG Chest 1 View  Result Date: 08/02/2022 CLINICAL DATA:  Fall and dizziness. EXAM: CHEST  1 VIEW COMPARISON:  None Available. FINDINGS: Cardiomegaly and RIGHT-sided AICD/pacemaker noted. LEFT AICD/pacemaker leads noted without  generator pack. There is no evidence of focal airspace disease, pulmonary edema, suspicious pulmonary nodule/mass, pleural effusion, or pneumothorax. No acute bony abnormalities are identified. IMPRESSION: No active disease. Electronically Signed   By: Margarette Canada M.D.   On: 08/02/2022 20:38    Microbiology: Results for orders placed or performed during the hospital encounter of 08/02/22  Resp Panel by RT-PCR (Flu A&B, Covid) Anterior Nasal Swab     Status: Abnormal   Collection Time: 08/02/22  8:03 PM   Specimen: Anterior Nasal Swab  Result Value Ref Range Status   SARS Coronavirus 2 by RT PCR POSITIVE (A) NEGATIVE Final    Comment: (NOTE) SARS-CoV-2 target nucleic acids are DETECTED.  The SARS-CoV-2 RNA is generally detectable in upper respiratory specimens during the acute phase of infection. Positive results are indicative of the presence of the identified virus, but do not rule out bacterial infection or co-infection with other pathogens not detected by the test. Clinical correlation with patient history and other diagnostic information is necessary to determine patient infection status. The expected result is Negative.  Fact Sheet for Patients: EntrepreneurPulse.com.au  Fact Sheet for Healthcare Providers: IncredibleEmployment.be  This test is not yet approved or cleared by the [place] FDA and  has been authorized for detection and/or diagnosis of SARS-CoV-2 by FDA under an Emergency Use Authorization (EUA).  This EUA will remain in effect (meaning this test can be used) for the duration of  the COVID-19 declaration under Section 564(b)(1) of the A ct, 21 U.S.C. section 360bbb-3(b)(1), unless the authorization is terminated or revoked sooner.     Influenza A by PCR NEGATIVE NEGATIVE Final   Influenza B by PCR NEGATIVE NEGATIVE Final    Comment: (NOTE) The Xpert Xpress SARS-CoV-2/FLU/RSV plus assay is intended as an aid in the  diagnosis of influenza from Nasopharyngeal swab specimens and should not be used as a sole basis for treatment. Nasal washings and aspirates are unacceptable for Xpert Xpress SARS-CoV-2/FLU/RSV testing.  Fact Sheet for Patients: EntrepreneurPulse.com.au  Fact Sheet for Healthcare Providers: IncredibleEmployment.be  This test is not yet approved or cleared by the [place] FDA and has been authorized for detection and/or diagnosis of SARS-CoV-2 by FDA under an Emergency Use Authorization (EUA). This EUA will remain in effect (meaning this test can be used) for the duration of the COVID-19 declaration under Section 564(b)(1) of the Act, 21 U.S.C. section 360bbb-3(b)(1), unless the authorization is terminated or revoked.  Performed at [facility], [place] [address], [place], [place] [address]   Culture, blood (single) w Reflex to ID Panel     Status: None (Preliminary result)   Collection Time: 08/03/22  7:37 AM   Specimen: BLOOD RIGHT WRIST  Result Value Ref Range Status   Specimen Description BLOOD RIGHT WRIST  Final   Special Requests   Final    BOTTLES DRAWN AEROBIC AND ANAEROBIC Blood Culture adequate volume   Culture   Final    NO GROWTH 2 DAYS Performed at [facility], [address], [place], [place] [address]    Report Status PENDING  Incomplete    Labs: CBC: Recent Labs  Lab  08/02/22 2011 08/04/22 0435 08/05/22 0800  WBC 7.1 3.9* 4.2  NEUTROABS 6.1  --  2.8  HGB 13.1 13.8 14.6  HCT 40.4 40.8 43.5  MCV 94.2 90.5 90.8  PLT 103* 88* 85*   Basic Metabolic Panel: Recent Labs  Lab 08/02/22 2011 08/03/22 0452 08/03/22 1050 08/04/22 0435 08/05/22 0800  NA 135 136  --  135 136  K 5.0 4.9  --  4.3 4.8  CL 106 108  --  108 106  CO2 22 20*  --  20* 20*  GLUCOSE 189* 164*  --  134* 122*  BUN 36* 39*  --  37* 28*  CREATININE 2.60* 2.63*  --  2.15* 1.88*  CALCIUM 8.8* 8.3*  --  7.8* 8.4*  MG  --   --  2.1 2.1  2.1  PHOS  --   --  4.8* 3.8 3.3   Liver Function Tests: Recent Labs  Lab 08/02/22 2011 08/03/22 0452 08/04/22 0435 08/05/22 0800  AST 958* 1,568* 1,288* 452*  ALT 875* 1,348* 1,450* 962*  ALKPHOS 59 51 51 46  BILITOT 0.9 1.1 1.0 0.9  PROT 6.0* 5.5* 5.3* 5.3*  ALBUMIN 3.3* 3.1* 2.9* 2.9*   CBG: Recent Labs  Lab 08/04/22 0719 08/04/22 1630 08/04/22 2153 08/05/22 0808 08/05/22 1140  GLUCAP 117* 118* 137* 111* 147*    Discharge time spent: 35 minutes.  Signed: Cordelia Poche, MD [facility] 08/05/2022

## 2022-08-08 LAB — CULTURE, BLOOD (SINGLE)
Culture: NO GROWTH
Special Requests: ADEQUATE

## 2022-08-13 LAB — AMIODARONE LEVEL
Amiodarone Lvl: 928 ng/mL — ABNORMAL LOW (ref 1000–2500)
N-Desethyl-Amiodarone: 791 ng/mL

## 2022-09-09 ENCOUNTER — Ambulatory Visit: Payer: Medicare Other | Admitting: Gastroenterology

## 2023-06-30 ENCOUNTER — Encounter (HOSPITAL_BASED_OUTPATIENT_CLINIC_OR_DEPARTMENT_OTHER): Payer: TRICARE For Life (TFL) | Admitting: Internal Medicine

## 2023-07-28 NOTE — Progress Notes (Signed)
 CC: FUV PVCs, CM, BiV ICD  HPI:  He is doing well. No further palpitations since amiodarone .  Feels well. No angina or dyspnea. No swelling.   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 30 tablet 5   ASPIRIN  LOW DOSE 81 MG EC tablet Take one tablet (81 mg dose) by mouth daily.     atorvastatin (LIPITOR) 40 mg tablet Take one tablet (40 mg dose) by mouth daily. 90 tablet 3   azelastine  (ASTELIN ) 0.1 % nasal spray one spray by Nasal route 2 (two) times daily. 90 mL 2   carvedilol  (COREG ) 6.25 mg tablet Take one tablet (6.25 mg dose) by mouth 2 (two) times daily with meals. 180 tablet 3   ENTRESTO  49-51 MG TABS per tablet TAKE 1 TABLET TWICE A DAY 180 tablet 3   fluticasone  propionate (FLONASE ) 50 mcg/actuation nasal spray one spray by Both Nostrils route 2 (two) times a day as needed for Rhinitis. 48 g 3   FREESTYLE LANCETS lancets USE TO CHECK BLOOD SUGARS  FOUR TIMES A DAY 400 each 3   furosemide  (LASIX ) 20 mg tablet TAKE 1 TABLET DAILY 90 tablet 3   glucose blood (FREESTYLE LITE TEST) test strip USE AS INSTRUCTED TO CHECK BLOOD SUGAR FOUR TIMES A DAY 400 each 3   insulin  aspart (NOVOLOG  FLEXPEN) 100 unit/mL injection Inject thirty Units into the skin 15 (fifteen) minutes before meals. 4 each 3   Insulin  Glargine (LANTUS  SOLOSTAR) 100 UNIT/ML SOPN INJECT 0 TO 61 UNITS UNDER THE SKIN DAILY AS NEEDED FOR HIGH BLOOD SUGAR 60 mL 3   isosorbide  mononitrate (IMDUR ) 30 mg 24 hr tablet TAKE 1 TABLET DAILY 90 tablet 3   JARDIANCE 25 MG TABS tablet TAKE 1 TABLET DAILY (Patient taking differently: one half tablet (12.5 mg dose).) 30 tablet 11   mupirocin (BACTROBAN) 2 % ointment Apply to affected area 3 times daily 22 g 0   nitroGLYCERIN (NITROSTAT) 0.4 mg SL tablet Place one tablet (0.4 mg dose) under the tongue every 5 (five) minutes as needed for Chest pain (If after 3 tablets Chest pain not resolved, call 911 or go to nearest emergency room.). 75 tablet 1   pantoprazole  sodium (PROTONIX ) 40 mg tablet Take one tablet (40 mg dose) by mouth daily. 90 tablet 1   spironolactone (ALDACTONE) 25 mg tablet TAKE ONE-HALF (1/2) TABLET DAILY 45 tablet  1   SURE COMFORT PEN NEEDLES 31G X 5 MM MISC USE TO INJECT FOUR TIMES A DAY 400 each 3   No current facility-administered medications for this visit.    Allergies: Patient has no known allergies.  Social History:  Unchanged.  Family History:  Unchanged.  ROS:  Other than the items above, all systems reviewed negative.  Physical Exam:  No acute distress. Pleasant.   Vitals:   07/28/23 1310  BP: 118/68  Pulse: 71  Resp: 17  SpO2: 96%    Lungs:   Clear.  Cor:  Regular rhythm, S1, S2, no murmur, rub or gallop.  Abd:  Non-distended.   Four extremities:  No edema, cyanosis, or clubbing.  Psyche:  Alert and oriented. Normal mood/affect.  ECG: ASVP  Device Check (see separate report for  details): May check ok.  Impression: 1. 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 Systolic and diastolic CHF, chronic - both severe  ECG 12 lead    3. CAD, h/o MI, ICM rEF < 25%, severe MR, mixed systolic/Grade 1 diastolic CHF      4. Chronic severe ischemic cardiomyopathy LVEF 18% -20%       5. 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

## 2024-11-27 ENCOUNTER — Other Ambulatory Visit: Payer: Self-pay

## 2024-11-27 ENCOUNTER — Encounter (HOSPITAL_COMMUNITY): Payer: Self-pay

## 2024-11-27 ENCOUNTER — Emergency Department (HOSPITAL_COMMUNITY)
Admission: EM | Admit: 2024-11-27 | Discharge: 2024-11-27 | Disposition: A | Discharge status: Home / Self Care | Attending: Emergency Medicine | Admitting: Emergency Medicine

## 2024-11-27 ENCOUNTER — Emergency Department (HOSPITAL_COMMUNITY)

## 2024-11-27 DIAGNOSIS — Z794 Long term (current) use of insulin: Secondary | ICD-10-CM | POA: Diagnosis not present

## 2024-11-27 DIAGNOSIS — I130 Hypertensive heart and chronic kidney disease with heart failure and stage 1 through stage 4 chronic kidney disease, or unspecified chronic kidney disease: Secondary | ICD-10-CM | POA: Insufficient documentation

## 2024-11-27 DIAGNOSIS — N189 Chronic kidney disease, unspecified: Secondary | ICD-10-CM | POA: Insufficient documentation

## 2024-11-27 DIAGNOSIS — J101 Influenza due to other identified influenza virus with other respiratory manifestations: Secondary | ICD-10-CM | POA: Insufficient documentation

## 2024-11-27 DIAGNOSIS — I2510 Atherosclerotic heart disease of native coronary artery without angina pectoris: Secondary | ICD-10-CM | POA: Insufficient documentation

## 2024-11-27 DIAGNOSIS — E1122 Type 2 diabetes mellitus with diabetic chronic kidney disease: Secondary | ICD-10-CM | POA: Insufficient documentation

## 2024-11-27 DIAGNOSIS — I509 Heart failure, unspecified: Secondary | ICD-10-CM | POA: Diagnosis not present

## 2024-11-27 DIAGNOSIS — Z79899 Other long term (current) drug therapy: Secondary | ICD-10-CM | POA: Diagnosis not present

## 2024-11-27 DIAGNOSIS — R079 Chest pain, unspecified: Secondary | ICD-10-CM | POA: Diagnosis present

## 2024-11-27 DIAGNOSIS — R072 Precordial pain: Secondary | ICD-10-CM

## 2024-11-27 LAB — RESP PANEL BY RT-PCR (RSV, FLU A&B, COVID)  RVPGX2
Influenza A by PCR: POSITIVE — AB
Influenza B by PCR: NEGATIVE
Resp Syncytial Virus by PCR: NEGATIVE
SARS Coronavirus 2 by RT PCR: NEGATIVE

## 2024-11-27 LAB — CBC WITH DIFFERENTIAL/PLATELET
Abs Immature Granulocytes: 0.03 K/uL (ref 0.00–0.07)
Basophils Absolute: 0 K/uL (ref 0.0–0.1)
Basophils Relative: 0 %
Eosinophils Absolute: 0 K/uL (ref 0.0–0.5)
Eosinophils Relative: 1 %
HCT: 44.3 % (ref 39.0–52.0)
Hemoglobin: 14.9 g/dL (ref 13.0–17.0)
Immature Granulocytes: 0 %
Lymphocytes Relative: 9 %
Lymphs Abs: 0.6 K/uL — ABNORMAL LOW (ref 0.7–4.0)
MCH: 30.7 pg (ref 26.0–34.0)
MCHC: 33.6 g/dL (ref 30.0–36.0)
MCV: 91.3 fL (ref 80.0–100.0)
Monocytes Absolute: 1 K/uL (ref 0.1–1.0)
Monocytes Relative: 15 %
Neutro Abs: 5.1 K/uL (ref 1.7–7.7)
Neutrophils Relative %: 75 %
Platelets: 115 K/uL — ABNORMAL LOW (ref 150–400)
RBC: 4.85 MIL/uL (ref 4.22–5.81)
RDW: 13.4 % (ref 11.5–15.5)
WBC: 6.9 K/uL (ref 4.0–10.5)
nRBC: 0 % (ref 0.0–0.2)

## 2024-11-27 LAB — COMPREHENSIVE METABOLIC PANEL WITH GFR
ALT: 23 U/L (ref 0–44)
AST: 29 U/L (ref 15–41)
Albumin: 3.9 g/dL (ref 3.5–5.0)
Alkaline Phosphatase: 101 U/L (ref 38–126)
Anion gap: 11 (ref 5–15)
BUN: 42 mg/dL — ABNORMAL HIGH (ref 8–23)
CO2: 25 mmol/L (ref 22–32)
Calcium: 9.2 mg/dL (ref 8.9–10.3)
Chloride: 101 mmol/L (ref 98–111)
Creatinine, Ser: 2.2 mg/dL — ABNORMAL HIGH (ref 0.61–1.24)
GFR, Estimated: 29 mL/min — ABNORMAL LOW
Glucose, Bld: 207 mg/dL — ABNORMAL HIGH (ref 70–99)
Potassium: 4.4 mmol/L (ref 3.5–5.1)
Sodium: 137 mmol/L (ref 135–145)
Total Bilirubin: 1 mg/dL (ref 0.0–1.2)
Total Protein: 6.3 g/dL — ABNORMAL LOW (ref 6.5–8.1)

## 2024-11-27 LAB — I-STAT CHEM 8, ED
BUN: 51 mg/dL — ABNORMAL HIGH (ref 8–23)
Calcium, Ion: 1.15 mmol/L (ref 1.15–1.40)
Chloride: 101 mmol/L (ref 98–111)
Creatinine, Ser: 2.2 mg/dL — ABNORMAL HIGH (ref 0.61–1.24)
Glucose, Bld: 207 mg/dL — ABNORMAL HIGH (ref 70–99)
HCT: 44 % (ref 39.0–52.0)
Hemoglobin: 15 g/dL (ref 13.0–17.0)
Potassium: 4.7 mmol/L (ref 3.5–5.1)
Sodium: 137 mmol/L (ref 135–145)
TCO2: 26 mmol/L (ref 22–32)

## 2024-11-27 LAB — PRO BRAIN NATRIURETIC PEPTIDE: Pro Brain Natriuretic Peptide: 2856 pg/mL — ABNORMAL HIGH

## 2024-11-27 LAB — TROPONIN T, HIGH SENSITIVITY
Troponin T High Sensitivity: 32 ng/L — ABNORMAL HIGH (ref 0–19)
Troponin T High Sensitivity: 29 ng/L — ABNORMAL HIGH (ref 0–19)

## 2024-11-27 LAB — MAGNESIUM: Magnesium: 2 mg/dL (ref 1.7–2.4)

## 2024-11-27 NOTE — ED Triage Notes (Signed)
 Patient coming from home. Patient's defibrillator went off at 10pm and 3am. Similar occurrence to three 3 years ago which was attributed to low potassium. No other recent issues. Patient has had a non productive cough for the past couple of the weeks. Defibrillator is medtronic. EMS VS 108/60 BP 60 HR paced 98% RA

## 2024-11-27 NOTE — ED Notes (Signed)
 This nurse called CCMD to have patient admitted and monitored.

## 2024-11-27 NOTE — ED Provider Notes (Signed)
 " [place] EMERGENCY DEPARTMENT AT [facility] Provider Note   CSN: [id number] Arrival date & time: 11/27/24  0401     Patient presents with: No chief complaint on file.   [name] is a 83 y.o. male.   The history is provided by the patient, the EMS personnel and medical records.  [name] is a 83 y.o. male who presents to the Emergency Department complaining of defibrillator for period.  He presents to the emergency department by EMS from home for evaluation after his defibrillator fired.  He states that he has had upper respiratory symptoms for the last few weeks with cough and congestion.  His wife did recently test positive for COVID but then tested negative.  He was sleeping around 11 PM and woke from sleep after his defibrillator fired.  He went back to sleep and had a second episode about 3 in the morning which woke him up.  He has had a similar episode 5 years ago related to low potassium.  He has not had any recent medication changes but does occasionally forget his nighttime medications.  He has a history of CKD, coronary artery disease, diabetes, CHF.     Prior to Admission medications  Medication Sig Start Date End Date Taking? Authorizing Provider  ascorbic acid  (VITAMIN C) 500 MG tablet Take 1 tablet (500 mg total) by mouth daily. 08/06/22   Briana Elgin LABOR, MD  Azelastine  HCl 0.15 % SOLN Place 1 spray into both nostrils daily. Patient taking differently: Place 1 spray into both nostrils 2 (two) times daily as needed (congestion). 03/12/20   Kuneff, Renee A, DO  carvedilol  (COREG ) 12.5 MG tablet Take 12.5 mg by mouth 2 (two) times daily. 05/28/20   [provider]  fluticasone  (FLONASE ) 50 MCG/ACT nasal spray Place 1 spray into both nostrils 2 (two) times daily as needed for allergies or rhinitis. 03/12/20   Kuneff, Renee A, DO  glucose blood (FREESTYLE LITE) test strip USE AS INSTRUCTED 06/26/19   Prentiss Frieze, DO  insulin  aspart (NOVOLOG  FLEXPEN) 100  UNIT/ML FlexPen INJECT 30 UNITS UNDER THE SKIN THREE TIMES A DAY WITH MEALS Patient taking differently: Inject 20-25 Units into the skin 3 (three) times daily with meals. 06/25/20   Kuneff, Renee A, DO  insulin  glargine (LANTUS  SOLOSTAR) 100 UNIT/ML Solostar Pen INJECT 59-60 UNITS UNDER THE SKIN DAILY or as directed Patient taking differently: Inject 35 Units into the skin at bedtime. 06/25/20   Kuneff, Renee A, DO  JARDIANCE 25 MG TABS tablet Take 12.5 mg by mouth daily. 05/29/22   [provider]  Lancets (FREESTYLE) lancets USE AS INSTRUCTED 11/30/19   Kennyth Worth HERO, MD  molnupiravir  EUA (LAGEVRIO ) 200 mg CAPS capsule Take 4 capsules (800 mg total) by mouth 2 (two) times daily. Complete bottle given to you on discharge. 08/05/22   Briana Elgin LABOR, MD  nitroGLYCERIN (NITROSTAT) 0.4 MG SL tablet Place 0.4 mg under the tongue every 5 (five) minutes as needed for chest pain.    [provider]  pantoprazole  (PROTONIX ) 40 MG tablet Take 1 tablet (40 mg total) by mouth 2 (two) times daily. Patient taking differently: Take 40 mg by mouth daily. 12/13/19   Kuneff, Renee A, DO  SURE COMFORT PEN NEEDLES 31G X 5 MM MISC USE 1 PEN NEEDLE TO INJECT UNDER THE SKIN THREE TIMES A DAY 08/28/20   Kennyth Worth HERO, MD  zinc  sulfate 220 (50 Zn) MG capsule Take 1 capsule (220  mg total) by mouth daily. 08/06/22   Briana Elgin LABOR, MD    Allergies: Patient has no known allergies.    Review of Systems  All other systems reviewed and are negative.   Updated Vital Signs BP 104/60   Pulse 63   Temp 98.6 F (37 C) (Oral)   Resp (!) 22   SpO2 96%   Physical Exam Vitals and nursing note reviewed.  Constitutional:      Appearance: He is well-developed.  HENT:     Head: Normocephalic and atraumatic.  Cardiovascular:     Rate and Rhythm: Normal rate and regular rhythm.     Heart sounds: No murmur heard. Pulmonary:     Effort: Pulmonary effort is normal. No respiratory distress.     Breath sounds:  Normal breath sounds.  Abdominal:     Palpations: Abdomen is soft.     Tenderness: There is no abdominal tenderness. There is no guarding or rebound.  Musculoskeletal:        General: No swelling or tenderness.  Skin:    General: Skin is warm and dry.  Neurological:     Mental Status: He is alert and oriented to person, place, and time.  Psychiatric:        Behavior: Behavior normal.     (all labs ordered are listed, but only abnormal results are displayed) Labs Reviewed  RESP PANEL BY RT-PCR (RSV, FLU A&B, COVID)  [id number] - Abnormal; Notable for the following components:      Result Value   Influenza A by PCR POSITIVE (*)    All other components within normal limits  COMPREHENSIVE METABOLIC PANEL WITH GFR - Abnormal; Notable for the following components:   Glucose, Bld 207 (*)    BUN 42 (*)    Creatinine, Ser 2.20 (*)    Total Protein 6.3 (*)    GFR, Estimated 29 (*)    All other components within normal limits  CBC WITH DIFFERENTIAL/PLATELET - Abnormal; Notable for the following components:   Platelets 115 (*)    Lymphs Abs 0.6 (*)    All other components within normal limits  PRO BRAIN NATRIURETIC PEPTIDE - Abnormal; Notable for the following components:   Pro Brain Natriuretic Peptide 2,856.0 (*)    All other components within normal limits  I-STAT CHEM 8, ED - Abnormal; Notable for the following components:   BUN 51 (*)    Creatinine, Ser 2.20 (*)    Glucose, Bld 207 (*)    All other components within normal limits  TROPONIN T, HIGH SENSITIVITY - Abnormal; Notable for the following components:   Troponin T High Sensitivity 32 (*)    All other components within normal limits  TROPONIN T, HIGH SENSITIVITY - Abnormal; Notable for the following components:   Troponin T High Sensitivity 29 (*)    All other components within normal limits  MAGNESIUM    EKG: EKG Interpretation Date/Time:  Monday November 27 2024 04:22:20 EST Ventricular Rate:  65 PR  Interval:  135 QRS Duration:  181 QT Interval:  496 QTC Calculation: 516 R Axis:   233  Text Interpretation: VENTRICULAR PACED RHYTHM Confirmed by Griselda Norris [phone]) on 11/27/2024 4:42:50 AM  Radiology: [facility] Chest Port 1 View Result Date: 11/27/2024 EXAM: 1 VIEW(S) XRAY OF THE CHEST 11/27/2024 04:33:40 AM COMPARISON: Radiograph of the chest dated 08/02/2022. CLINICAL HISTORY: chest pain FINDINGS: LINES, TUBES AND DEVICES: Right subclavian AICD with leads extending towards right atrium, right ventricle, and coronary sinus. Left  chest leads noted. LUNGS AND PLEURA: No focal pulmonary opacity. No pleural effusion. No pneumothorax. HEART AND MEDIASTINUM: Right subclavian AICD with leads extending towards right atrium, right ventricle, and coronary sinus. Aortic calcification. BONES AND SOFT TISSUES: No acute osseous abnormality. IMPRESSION: 1. No acute cardiopulmonary abnormality. 2. Right subclavian AICD in place with leads projecting to the right atrium, right ventricle, and coronary sinus. 3. Aortic atherosclerotic calcification. Electronically signed by: Evalene Coho MD 11/27/2024 04:40 AM EST RP Workstation: [id number]     Procedures   Medications Ordered in the ED - No data to display                                  Medical Decision Making Amount and/or Complexity of Data Reviewed Labs: ordered. Radiology: ordered.   Pt with hx/o CKD, DM, HTN, CHF, pacemaker/defibrillator here after sensation that defib fired twice and woke from sleep.  Pain is described as a very brief, seconds long sharp pain to the left chest.  He has not had any pain since then.  He has had several days of upper respiratory symptoms without fever.  Labs with stable renal insufficiency.  Troponins are mildly elevated but stable, similar when compared to priors in epic.  He also has elevation in his BNP, this appears to be stable.  He appears euvolemic on examination.  He is positive for influenza.   Interrogation of his device has no reported shocks or episodes of V. tach, V-fib.  Device appears to be functioning appropriately.  Patient is asymptomatic at time of assessment in the department.  Unclear what triggered patient's brief sharp pain episodes.  Presentation is not consistent with dissection, PE.  Feel he is stable for discharge home with outpatient cardiology follow-up and return precautions.  In terms of his influenza, he is minimally symptomatic and has been experiencing symptoms for several days, he is not an antiviral candidate at this time.     Final diagnoses:  Influenza A  Precordial pain    ED Discharge Orders     None          Griselda Norris, MD 11/27/24 2310  "
